# Patient Record
Sex: Female | Born: 1985 | Race: Black or African American | Hispanic: No | Marital: Married | Smoking: Current every day smoker
Health system: Southern US, Community
[De-identification: ages and names within clinical notes are randomized; demographics above are authoritative.]

## PROBLEM LIST (undated history)

## (undated) DIAGNOSIS — I5030 Unspecified diastolic (congestive) heart failure: Secondary | ICD-10-CM

## (undated) DIAGNOSIS — G4733 Obstructive sleep apnea (adult) (pediatric): Secondary | ICD-10-CM

## (undated) DIAGNOSIS — I509 Heart failure, unspecified: Secondary | ICD-10-CM

## (undated) DIAGNOSIS — J449 Chronic obstructive pulmonary disease, unspecified: Principal | ICD-10-CM

## (undated) DIAGNOSIS — F17200 Nicotine dependence, unspecified, uncomplicated: Secondary | ICD-10-CM

## (undated) DIAGNOSIS — J9611 Chronic respiratory failure with hypoxia: Secondary | ICD-10-CM

## (undated) HISTORY — PX: TUBAL LIGATION: SHX77

## (undated) HISTORY — PX: IUD REMOVAL: SHX5392

## (undated) HISTORY — PX: INTRAUTERINE DEVICE INSERTION: SHX323

## (undated) HISTORY — PX: CHOLECYSTECTOMY: SHX55

---
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
## 2003-04-01 ENCOUNTER — Inpatient Hospital Stay (HOSPITAL_COMMUNITY): Admission: AD | Admit: 2003-04-01 | Discharge: 2003-04-01 | Payer: Self-pay | Admitting: Obstetrics & Gynecology

## 2011-02-03 ENCOUNTER — Emergency Department (INDEPENDENT_AMBULATORY_CARE_PROVIDER_SITE_OTHER): Payer: Self-pay

## 2011-02-03 ENCOUNTER — Emergency Department (HOSPITAL_BASED_OUTPATIENT_CLINIC_OR_DEPARTMENT_OTHER)
Admission: EM | Admit: 2011-02-03 | Discharge: 2011-02-03 | Disposition: A | Discharge status: Home / Self Care | Payer: Self-pay | Attending: Emergency Medicine | Admitting: Emergency Medicine

## 2011-02-03 ENCOUNTER — Encounter: Payer: Self-pay | Admitting: *Deleted

## 2011-02-03 DIAGNOSIS — J189 Pneumonia, unspecified organism: Secondary | ICD-10-CM | POA: Insufficient documentation

## 2011-02-03 DIAGNOSIS — R079 Chest pain, unspecified: Secondary | ICD-10-CM

## 2011-02-03 DIAGNOSIS — R0789 Other chest pain: Secondary | ICD-10-CM

## 2011-02-03 DIAGNOSIS — R0602 Shortness of breath: Secondary | ICD-10-CM

## 2011-02-03 DIAGNOSIS — R071 Chest pain on breathing: Secondary | ICD-10-CM | POA: Insufficient documentation

## 2011-02-03 DIAGNOSIS — J45909 Unspecified asthma, uncomplicated: Secondary | ICD-10-CM | POA: Insufficient documentation

## 2011-02-03 DIAGNOSIS — J45901 Unspecified asthma with (acute) exacerbation: Secondary | ICD-10-CM

## 2011-02-03 MED ORDER — ALBUTEROL SULFATE HFA 108 (90 BASE) MCG/ACT IN AERS
4.0000 | INHALATION_SPRAY | Freq: Once | RESPIRATORY_TRACT | Status: AC
  Administered 2011-02-03: 4 via RESPIRATORY_TRACT
  Filled 2011-02-03: qty 6.7

## 2011-02-03 MED ORDER — OXYCODONE-ACETAMINOPHEN 5-325 MG PO TABS: 2.0000 | ORAL_TABLET | ORAL | Status: AC | PRN

## 2011-02-03 MED ORDER — AMOXICILLIN 500 MG PO TABS: 1000.0000 mg | ORAL_TABLET | Freq: Two times a day (BID) | ORAL | Status: AC

## 2011-02-03 MED ORDER — PREDNISONE 20 MG PO TABS: ORAL_TABLET | ORAL | Status: AC

## 2011-02-03 MED ORDER — ALBUTEROL SULFATE (5 MG/ML) 0.5% IN NEBU
5.0000 mg | INHALATION_SOLUTION | Freq: Once | RESPIRATORY_TRACT | Status: AC
  Administered 2011-02-03: 5 mg via RESPIRATORY_TRACT

## 2011-02-03 MED ORDER — ALBUTEROL SULFATE (5 MG/ML) 0.5% IN NEBU
INHALATION_SOLUTION | RESPIRATORY_TRACT | Status: AC
  Filled 2011-02-03: qty 1

## 2011-02-03 MED ORDER — IPRATROPIUM BROMIDE 0.02 % IN SOLN
RESPIRATORY_TRACT | Status: AC
  Filled 2011-02-03: qty 2.5

## 2011-02-03 MED ORDER — PREDNISONE 50 MG PO TABS
60.0000 mg | ORAL_TABLET | Freq: Once | ORAL | Status: AC
  Administered 2011-02-03: 60 mg via ORAL
  Filled 2011-02-03: qty 1

## 2011-02-03 MED ORDER — OXYCODONE-ACETAMINOPHEN 5-325 MG PO TABS
2.0000 | ORAL_TABLET | Freq: Once | ORAL | Status: DC
  Filled 2011-02-03: qty 2

## 2011-02-03 MED ORDER — IPRATROPIUM BROMIDE 0.02 % IN SOLN
0.5000 mg | Freq: Once | RESPIRATORY_TRACT | Status: AC
  Administered 2011-02-03: 0.5 mg via RESPIRATORY_TRACT

## 2011-02-03 NOTE — ED Notes (Signed)
Pt states that she has an hx of asthma but hasnt used anything for it in a long time and nothing presently. Pt presented to the ED with some pain in chest and stated that it hurt for her to cough and take deep breaths.

## 2011-02-03 NOTE — ED Notes (Signed)
Pt states she was seen at [facility] a week ago and dx'd with pneumonitis. Given antibiotic. About 2 days ago debeloped increased SHOB, hurts under left breast into back. Also hurts to cough, move or take a deep breath.

## 2011-02-03 NOTE — ED Provider Notes (Signed)
History  Scribed for Hurman Horn, MD, the patient was seen in room MH02. This chart was scribed by Hillery Hunter.   CSN: [id number] Arrival date & time: 02/03/2011  8:33 PM   First MD Initiated Contact with Patient 02/03/11 2131      Chief Complaint  Patient presents with  . Chest Pain    The history is provided by the patient.   Renee Paul is a 25 y.o. female who presents to the Emergency Department complaining of left lateral chest wall pain with over one week of coughing, wheezing and shortness of breath. She has a history of asthma and confirms that she smokes cigarettes. She reports that she had a CXR taken and dx with pneumonitis about one week ago and was given analgesic, antibiotic, prednisone and albuterol inhaler. She was able to fill the scripts and finish the dosages of the hydrocodone, antibiotic and prednisone, but was not able to fill the Albuterol inhaler. She returns today due to persistent left chest wall pain that is worse with coughing and deep breathing and movements. She denies any fever, rash, hallucinations.    Past Medical History  Diagnosis Date  . Asthma     Past Surgical History  Procedure Date  . Cesarean section     History reviewed. No pertinent family history.  History  Substance Use Topics  . Smoking status: Current Everyday Smoker  . Smokeless tobacco: Not on file  . Alcohol Use: No     Review of Systems  Constitutional: Negative for fever.  HENT: Negative for neck pain.   Eyes: Negative for discharge.  Respiratory: Positive for cough and wheezing.   Cardiovascular: Positive for chest pain (chest wall soreness and tenderness on left side).  Gastrointestinal: Negative for nausea and vomiting.  Genitourinary: Negative for dysuria.  Musculoskeletal: Negative for gait problem.  Skin: Negative for rash.  Neurological: Negative for light-headedness.  Psychiatric/Behavioral: Negative for hallucinations and confusion.     Allergies  Review of patient's allergies indicates no known allergies.  Home Medications   Current Outpatient Rx  Name Route Sig Dispense Refill  . LEVONORGESTREL 20 MCG/24HR IU IUD Intrauterine 1 each by Intrauterine route once.        Pulse 95  Ht 5\' 3"  (1.6 m)  Wt 310 lb (140.615 kg)  BMI 54.91 kg/m2  SpO2 96%  LMP 01/31/2011  Physical Exam  Nursing note and vitals reviewed. Constitutional:       Awake, alert, nontoxic appearance, morbidly obese  HENT:  Head: Atraumatic.  Mouth/Throat: Oropharynx is clear and moist.  Eyes: Right eye exhibits no discharge. Left eye exhibits no discharge.  Neck: Neck supple.  Cardiovascular: Normal rate, regular rhythm and normal heart sounds.   No murmur heard. Pulmonary/Chest: Effort normal. No respiratory distress. She has wheezes (expiratory wheezes throughout). She has no rales. She exhibits tenderness (whole left lower rib cage).  Abdominal: Soft. There is no tenderness. There is no rebound.  Musculoskeletal: She exhibits no tenderness.       Baseline ROM, no obvious new focal weakness.  Neurological:       Mental status and motor strength appears baseline for patient and situation.  Skin: No rash noted.  Psychiatric: She has a normal mood and affect.    ED Course  Procedures    Dg Chest 2 View  02/03/2011  *RADIOLOGY REPORT*  Clinical Data: Left-sided chest pain, shortness of breath and cough; history of smoking.  CHEST - 2 VIEW  Comparison: None.  Findings: The lungs are well-aerated.  Mild focal left lower lobe opacity likely reflects pneumonia.  There is no evidence of pleural effusion or pneumothorax.  The heart is normal in size; the mediastinal contour is within normal limits.  No acute osseous abnormalities are seen.  IMPRESSION: Mild focal left lower lobe pneumonia noted.  Original Report Authenticated By: Tonia Ghent, M.D.     OTHER DATA REVIEWED: Nursing notes, vital signs, and past medical records  reviewed.   DIAGNOSTIC STUDIES: Oxygen Saturation is 96% on room air, normal by my interpretation.     ED COURSE / COORDINATION OF CARE: 21:40. Smoking cessation discussion with patient for 3 minutes.  21:47. Ordered: DG Chest 2 View ; albuterol (PROVENTIL HFA;VENTOLIN HFA) 108 (90 BASE) MCG/ACT inhaler 4 puff ; predniSONE (DELTASONE) tablet 60 mg ; oxyCODONE-acetaminophen (PERCOCET) 5-325 MG per tablet 2 tablet  22:36. Discussed test findings and treatment plan with patient at bedside who is amenable to plan and comfortable for discharge.   MDM  Patient / Family / Caregiver informed of clinical course, understand medical decision-making process, and agree with plan.I doubt any other EMC precluding discharge at this time including, but not necessarily limited to the following:sepsis.  Pt Dx recent PNA escort states today's CXR looks better than the one at [facility] a week ago.   1. Community acquired pneumonia   2. Asthma attack   3. Chest wall pain      I personally performed the services described in this documentation, which was scribed in my presence. The recorded information has been reviewed and considered.    Hurman Horn, MD 02/04/11 [phone]

## 2011-08-24 ENCOUNTER — Other Ambulatory Visit: Payer: Self-pay

## 2011-09-11 ENCOUNTER — Emergency Department (HOSPITAL_COMMUNITY)
Admission: EM | Admit: 2011-09-11 | Discharge: 2011-09-11 | Disposition: A | Discharge status: Home / Self Care | Payer: BC Managed Care – PPO | Attending: Emergency Medicine | Admitting: Emergency Medicine

## 2011-09-11 ENCOUNTER — Encounter (HOSPITAL_COMMUNITY): Payer: Self-pay | Admitting: Family Medicine

## 2011-09-11 DIAGNOSIS — O99330 Smoking (tobacco) complicating pregnancy, unspecified trimester: Secondary | ICD-10-CM | POA: Insufficient documentation

## 2011-09-11 DIAGNOSIS — O99210 Obesity complicating pregnancy, unspecified trimester: Secondary | ICD-10-CM | POA: Insufficient documentation

## 2011-09-11 DIAGNOSIS — E669 Obesity, unspecified: Secondary | ICD-10-CM | POA: Insufficient documentation

## 2011-09-11 DIAGNOSIS — O200 Threatened abortion: Secondary | ICD-10-CM | POA: Insufficient documentation

## 2011-09-11 MED ORDER — ONDANSETRON 8 MG PO TBDP
8.0000 mg | ORAL_TABLET | Freq: Once | ORAL | Status: AC
  Administered 2011-09-11: 8 mg via ORAL
  Filled 2011-09-11: qty 1

## 2011-09-11 MED ORDER — ACETAMINOPHEN 325 MG PO TABS
650.0000 mg | ORAL_TABLET | Freq: Once | ORAL | Status: AC
  Administered 2011-09-11: 650 mg via ORAL
  Filled 2011-09-11: qty 2

## 2011-09-11 NOTE — ED Notes (Signed)
Unable to auscultate fetal heart tone at this time. Patient states that fetal heart tones were heard at [facility] with rate in the 150s.

## 2011-09-11 NOTE — ED Provider Notes (Addendum)
History     CSN: [id number]  Arrival date & time 09/11/11  1844   First MD Initiated Contact with Patient 09/11/11 2004      Chief Complaint  Patient presents with  . Vaginal Bleeding    (Consider location/radiation/quality/duration/timing/severity/associated sxs/prior treatment) HPI Comments: Seen today at [facility] and was diagnosed with a threatened abortion. She states her care was poor and she didn't bleed but they said he came here for second opinion.  Patient is a 26 y.o. female presenting with vaginal bleeding. The history is provided by the patient.  Vaginal Bleeding This is a new problem. The current episode started 6 to 12 hours ago. The problem occurs constantly. The problem has been gradually improving. Associated symptoms include abdominal pain. Pertinent negatives include no chest pain and no shortness of breath. The symptoms are aggravated by intercourse (started after intercourse today). Nothing relieves the symptoms. She has tried nothing for the symptoms. The treatment provided no relief.    Past Medical History  Diagnosis Date  . Asthma     Past Surgical History  Procedure Date  . Cesarean section   . Cesarean section   . Intrauterine device insertion   . Iud removal     No family history on file.  History  Substance Use Topics  . Smoking status: Current Everyday Smoker -- 0.2 packs/day    Types: Cigarettes  . Smokeless tobacco: Not on file  . Alcohol Use: No    OB History    Grav Para Term Preterm Abortions TAB SAB Ect Mult Living   1               Review of Systems  Respiratory: Negative for shortness of breath.   Cardiovascular: Negative for chest pain.  Gastrointestinal: Positive for abdominal pain.  Genitourinary: Positive for vaginal bleeding. Negative for vaginal discharge and pelvic pain.  All other systems reviewed and are negative.    Allergies  Review of patient's allergies indicates no known allergies.  Home  Medications   Current Outpatient Rx  Name Route Sig Dispense Refill  . PRENATAL MULTIVITAMIN CH Oral Take 1 tablet by mouth daily.      BP 140/85  Pulse 83  Temp 98.8 F (37.1 C) (Oral)  Resp 18  SpO2 98%  LMP 01/31/2011  Physical Exam  Nursing note and vitals reviewed. Constitutional: She is oriented to person, place, and time. She appears well-developed and well-nourished. No distress.       Morbidly obese  HENT:  Head: Normocephalic and atraumatic.  Mouth/Throat: Oropharynx is clear and moist.  Eyes: Conjunctivae and EOM are normal. Pupils are equal, round, and reactive to light.  Neck: Normal range of motion. Neck supple.  Cardiovascular: Normal rate, regular rhythm and intact distal pulses.   No murmur heard. Pulmonary/Chest: Effort normal and breath sounds normal. No respiratory distress. She has no wheezes. She has no rales.  Abdominal: Soft. She exhibits no distension. There is tenderness in the suprapubic area. There is no rebound, no guarding and no CVA tenderness.  Genitourinary: Uterus is enlarged. Cervix exhibits no motion tenderness and no discharge. Right adnexum displays no tenderness. Left adnexum displays no tenderness. There is bleeding around the vagina.  Musculoskeletal: Normal range of motion. She exhibits no edema and no tenderness.  Neurological: She is alert and oriented to person, place, and time.  Skin: Skin is warm and dry. No rash noted. No erythema.  Psychiatric: She has a normal mood and affect. Her  behavior is normal.    ED Course  Procedures (including critical care time)  Labs Reviewed - No data to display No results found.  EMERGENCY DEPARTMENT [place] PREGNANCY "Study: Limited Ultrasound of the Pelvis"  INDICATIONS:Pregnancy(required) and Vaginal bleeding Multiple views of the uterus and pelvic cavity are obtained with a multi-frequency probe.  APPROACH:Transabdominal   PERFORMED BY: Myself  IMAGES ARCHIVED?: No  LIMITATIONS: Body  habitus  PREGNANCY FREE FLUID: None  PREGNANCY UTERUS FINDINGS:Uterus enlarged ADNEXAL FINDINGS:Left ovary not seen and Right ovary not seen  PREGNANCY FINDINGS: Intrauterine gestational sac noted and Fetal heart activity seen  INTERPRETATION: Viable intrauterine pregnancy and Pelvic free fluid absent  GESTATIONAL AGE, ESTIMATE: 11 weeks  FETAL HEART RATE: 143  COMMENT(Estimate of Gestational Age):       1. Threatened abortion       MDM   Patient with vaginal bleeding that started today. She was initially seen [facility] and told that she had a hemorrhage in her uterus at that point the fetus was normal and was discharged. Patient states she was treated variably and had very bad care there and came here because she wanted to make sure everything was okay. On exam patient has some mild lower abdominal cramping but otherwise normal exam. Ultrasound at bedside showed an active fetus measuring [redacted] weeks gestation with positive fetal heart tones of 145. Patient does have blood in her vaginal vault without clotting and no cervical motion tenderness. Patient has no history of Rh incompatibility and has never received RhoGAM for her other pregnancies.  Patient denies any urinary symptoms and states the pain started after having sex this morning. Discussed with her taking Tylenol as needed for the cramping and she has follow up with her OB/GYN tomorrow.        Gwyneth Sprout, MD 09/11/11 2200  Gwyneth Sprout, MD 09/11/11 2205

## 2011-09-11 NOTE — ED Notes (Signed)
Patient states that she is [redacted] weeks pregnant, 3rd pregnancy. OB is in [employer]. States that she started having vaginal bleeding this morning around 11am while sitting in class. Patient was taken to [facility] and was told she has a hemorrhage in her uterus. Patient states staff was rude and she was discharged from [facility] with instructions to follow-up with OB. (Dr. Ferdinand Cava). States she is still having vaginal pain and pain in her abdomen.

## 2011-09-11 NOTE — ED Notes (Signed)
Bed:WA13<BR> Expected date:<BR> Expected time:<BR> Means of arrival:<BR> Comments:<BR> Hold for triage 2

## 2011-11-07 ENCOUNTER — Other Ambulatory Visit (HOSPITAL_COMMUNITY): Payer: Self-pay | Admitting: Obstetrics and Gynecology

## 2011-11-07 DIAGNOSIS — Z3689 Encounter for other specified antenatal screening: Secondary | ICD-10-CM

## 2011-11-13 ENCOUNTER — Ambulatory Visit (HOSPITAL_COMMUNITY)
Admission: RE | Admit: 2011-11-13 | Discharge: 2011-11-13 | Disposition: A | Discharge status: Home / Self Care | Payer: BC Managed Care – PPO | Source: Ambulatory Visit | Attending: Obstetrics and Gynecology | Admitting: Obstetrics and Gynecology

## 2011-11-13 ENCOUNTER — Encounter (HOSPITAL_COMMUNITY): Payer: Self-pay

## 2011-11-13 VITALS — BP 120/84 | HR 82 | Wt 392.0 lb

## 2011-11-13 DIAGNOSIS — O34219 Maternal care for unspecified type scar from previous cesarean delivery: Secondary | ICD-10-CM | POA: Insufficient documentation

## 2011-11-13 DIAGNOSIS — O99210 Obesity complicating pregnancy, unspecified trimester: Secondary | ICD-10-CM | POA: Insufficient documentation

## 2011-11-13 DIAGNOSIS — O358XX0 Maternal care for other (suspected) fetal abnormality and damage, not applicable or unspecified: Secondary | ICD-10-CM | POA: Insufficient documentation

## 2011-11-13 DIAGNOSIS — E669 Obesity, unspecified: Secondary | ICD-10-CM | POA: Insufficient documentation

## 2011-11-13 DIAGNOSIS — Z3689 Encounter for other specified antenatal screening: Secondary | ICD-10-CM

## 2011-11-13 NOTE — Progress Notes (Signed)
Renee Paul  was seen today for an ultrasound appointment.  See full report in [facility].  Alpha Gula, MD  Single IUP at 20 2/7 weeks No fetal anomalies noted; limited views of the fetal heart and spine were obtained due to poor ultrasound penetration No markers associated with aneuploidy were noted Normal amniotic fluid volume.  Recommend follow up ultrasound in 4 weeks to reevaluate the heart and spine.

## 2011-12-13 ENCOUNTER — Ambulatory Visit (HOSPITAL_COMMUNITY)
Admission: RE | Admit: 2011-12-13 | Discharge: 2011-12-13 | Disposition: A | Discharge status: Home / Self Care | Payer: BC Managed Care – PPO | Source: Ambulatory Visit | Attending: Obstetrics and Gynecology | Admitting: Obstetrics and Gynecology

## 2011-12-13 DIAGNOSIS — E669 Obesity, unspecified: Secondary | ICD-10-CM | POA: Insufficient documentation

## 2011-12-13 DIAGNOSIS — O34219 Maternal care for unspecified type scar from previous cesarean delivery: Secondary | ICD-10-CM | POA: Insufficient documentation

## 2011-12-13 DIAGNOSIS — Z3689 Encounter for other specified antenatal screening: Secondary | ICD-10-CM

## 2011-12-13 DIAGNOSIS — O99210 Obesity complicating pregnancy, unspecified trimester: Secondary | ICD-10-CM | POA: Insufficient documentation

## 2011-12-13 NOTE — Progress Notes (Signed)
Ms. Vanscyoc had an ultrasound appointment today.  Please see [facility] report for details.  Comments There is an active singleton fetus with no apparent dysmorphic features on today's routine anatomic re-examination.  The biometry suggests a fetus with an EFW at the approximately 38th percentile for gestational age.    Impression Active singleton fetus. Normal interval growth. Normal amniotic fluid volume Imaging of the fetal heart remains suboptimal noting that the RVOT and Ductal Arch are not well seen today, warranting follow up evaluation.   Recommendations 1. Repeat interval growth assessment and follow up anatomy (RVOT and Ductal Arch) by ultrasound was scheduled for your patient in 4 weeks. 2. Follow as clinically indicated.  Rogelia Boga, MD, MS, FACOG Assistant Professor Section of Maternal-Fetal Medicine [facility]

## 2011-12-19 ENCOUNTER — Institutional Professional Consult (permissible substitution): Payer: BC Managed Care – PPO | Admitting: Critical Care Medicine

## 2012-01-09 ENCOUNTER — Other Ambulatory Visit (HOSPITAL_COMMUNITY): Payer: Self-pay | Admitting: Obstetrics and Gynecology

## 2012-01-09 DIAGNOSIS — Z0489 Encounter for examination and observation for other specified reasons: Secondary | ICD-10-CM

## 2012-01-10 ENCOUNTER — Ambulatory Visit (HOSPITAL_COMMUNITY): Payer: BC Managed Care – PPO

## 2012-01-17 ENCOUNTER — Ambulatory Visit (HOSPITAL_COMMUNITY)
Admission: RE | Admit: 2012-01-17 | Discharge: 2012-01-17 | Disposition: A | Discharge status: Home / Self Care | Payer: BC Managed Care – PPO | Source: Ambulatory Visit | Attending: Obstetrics and Gynecology | Admitting: Obstetrics and Gynecology

## 2012-01-17 DIAGNOSIS — Z1389 Encounter for screening for other disorder: Secondary | ICD-10-CM | POA: Insufficient documentation

## 2012-01-17 DIAGNOSIS — Z363 Encounter for antenatal screening for malformations: Secondary | ICD-10-CM | POA: Insufficient documentation

## 2012-01-17 DIAGNOSIS — Z0489 Encounter for examination and observation for other specified reasons: Secondary | ICD-10-CM

## 2012-01-17 NOTE — Progress Notes (Signed)
Renee Paul was seen for ultrasound appointment today.  Please see [facility]-OBGYN report for details.

## 2012-02-14 ENCOUNTER — Ambulatory Visit (HOSPITAL_COMMUNITY): Payer: BC Managed Care – PPO

## 2012-02-17 ENCOUNTER — Other Ambulatory Visit (HOSPITAL_COMMUNITY): Payer: Self-pay | Admitting: Obstetrics and Gynecology

## 2012-02-17 ENCOUNTER — Ambulatory Visit (HOSPITAL_COMMUNITY)
Admission: RE | Admit: 2012-02-17 | Discharge: 2012-02-17 | Disposition: A | Discharge status: Home / Self Care | Payer: BC Managed Care – PPO | Source: Ambulatory Visit | Attending: Obstetrics and Gynecology | Admitting: Obstetrics and Gynecology

## 2012-02-17 ENCOUNTER — Other Ambulatory Visit: Payer: Self-pay

## 2012-02-17 ENCOUNTER — Ambulatory Visit (HOSPITAL_COMMUNITY)
Admission: RE | Admit: 2012-02-17 | Discharge: 2012-02-17 | Payer: BC Managed Care – PPO | Source: Ambulatory Visit | Attending: Obstetrics and Gynecology | Admitting: Obstetrics and Gynecology

## 2012-02-17 DIAGNOSIS — Z1389 Encounter for screening for other disorder: Secondary | ICD-10-CM | POA: Insufficient documentation

## 2012-02-17 DIAGNOSIS — Z363 Encounter for antenatal screening for malformations: Secondary | ICD-10-CM | POA: Insufficient documentation

## 2012-02-17 DIAGNOSIS — Z0489 Encounter for examination and observation for other specified reasons: Secondary | ICD-10-CM

## 2012-02-17 NOTE — Progress Notes (Signed)
[name]  had an ultrasound appointment today.  Please see [facility]OB/GY[facility] report for details.  Comments There is an active singleton fetus with no apparent structural defects on today's routine anatomic re-examination.  The biometry suggests a fetus with an EFW at the approximately 27th percentile for gestational age with noted short humerus bones (bilateral, <5th) and femurs (<2.3 percentile). A detailed survey of long bones (radius, ulna, tibia, fibula) demonstrate measurements within normal range.  I discussed the implications of today's findings with your patient in context of "low risk" maternal serum quad screen, explaining to her that today's measurements are most likely representative of normal variation, especially in context of relatively short maternal and paternal stature.  However, short humerus does have an association with Trisomy21, warranting my discussion with her.  She elected to have Harmony test.  Results will be reviewed with your patient when they become available.  Amniocentesis was offered as an option for formal diagnosis of fetal karyotype, but your patient did not express any interest and declined.   Impression Active singleton fetus with short humerus and femur measurements but overall normal interval growth. Normal amniotic fluid volume   Recommendations 1. Repeat interval growth assessment by ultrasound was scheduled for your patient in 3 weeks. 2. Harmony test was drawn for your patient.   3. Follow as clinically indicated.  Rogelia Boga, MD, MS, FACOG Assistant Professor Section of Maternal-Fetal Medicine [facility]

## 2012-02-17 NOTE — Progress Notes (Signed)
[name]  had an ultrasound appointment today.  Please see [facility]-OB/GYN report for details.  Comments There is an active singleton fetus with no apparent structural defects on today's routine anatomic re-examination.  The biometry suggests a fetus with an EFW at the approximately 27th percentile for gestational age with noted short humerus bones (bilateral, <5th) and femurs (<2.3 percentile). A detailed survey of long bones (radius, ulna, tibia, fibula) demonstrate measurements within normal range.  I discussed the implications of today's findings with your patient in context of "low risk" maternal serum quad screen, explaining to her that today's measurements are most likely representative of normal variation, especially in context of relatively short maternal and paternal stature.  However, short humerus does have an association with Trisomy21, warranting my discussion with her.  She elected to have Harmony test.  Results will be reviewed with your patient when they become available.  Amniocentesis was offered as an option for formal diagnosis of fetal karyotype, but your patient did not express any interest and declined.   Impression Active singleton fetus with short humerus and femur measurements but overall normal interval growth. Normal amniotic fluid volume   Recommendations 1. Repeat interval growth assessment by ultrasound was scheduled for your patient in 3 weeks. 2. Harmony test was drawn for your patient.   3. Follow as clinically indicated.  Denney, Jeffrey Morgan, MD, MS, FACOG Assistant Professor Section of Maternal-Fetal Medicine [employer]  

## 2012-02-28 ENCOUNTER — Telehealth (HOSPITAL_COMMUNITY): Payer: Self-pay | Admitting: MS"

## 2012-02-28 NOTE — Telephone Encounter (Signed)
Left message for patient to return call.   Renee Paul 02/28/2012  10:21 AM

## 2012-02-28 NOTE — Telephone Encounter (Signed)
Called Renee Paul to discuss her Harmony, cell free fetal DNA testing. Testing was offered because of previous ultrasound findings. We reviewed that these are within normal limits, showing a less than 1 in 10,000 risk for trisomies 21, 18 and 13. We reviewed that this testing identifies > 99% of pregnancies with trisomy 21, >98% of pregnancies with trisomy 76, and >80% with trisomy 56; the false positive rate is <0.1% for all conditions. She understands that this testing does not identify all genetic conditions. All questions were answered to her satisfaction, she was encouraged to call with additional questions or concerns.  Quinn Plowman, MS Patent attorney

## 2012-03-13 ENCOUNTER — Ambulatory Visit (HOSPITAL_COMMUNITY)
Admission: RE | Admit: 2012-03-13 | Discharge: 2012-03-13 | Disposition: A | Discharge status: Home / Self Care | Payer: BC Managed Care – PPO | Source: Ambulatory Visit | Attending: Obstetrics and Gynecology | Admitting: Obstetrics and Gynecology

## 2012-03-13 DIAGNOSIS — E669 Obesity, unspecified: Secondary | ICD-10-CM | POA: Insufficient documentation

## 2012-03-13 DIAGNOSIS — O358XX0 Maternal care for other (suspected) fetal abnormality and damage, not applicable or unspecified: Secondary | ICD-10-CM | POA: Insufficient documentation

## 2012-03-13 DIAGNOSIS — Z0489 Encounter for examination and observation for other specified reasons: Secondary | ICD-10-CM

## 2012-03-13 DIAGNOSIS — O34219 Maternal care for unspecified type scar from previous cesarean delivery: Secondary | ICD-10-CM | POA: Insufficient documentation

## 2012-06-23 ENCOUNTER — Emergency Department (HOSPITAL_BASED_OUTPATIENT_CLINIC_OR_DEPARTMENT_OTHER): Payer: BC Managed Care – PPO

## 2012-06-23 ENCOUNTER — Encounter (HOSPITAL_BASED_OUTPATIENT_CLINIC_OR_DEPARTMENT_OTHER): Payer: Self-pay | Admitting: *Deleted

## 2012-06-23 ENCOUNTER — Emergency Department (HOSPITAL_BASED_OUTPATIENT_CLINIC_OR_DEPARTMENT_OTHER)
Admission: EM | Admit: 2012-06-23 | Discharge: 2012-06-23 | Disposition: A | Discharge status: Home / Self Care | Payer: BC Managed Care – PPO | Attending: Emergency Medicine | Admitting: Emergency Medicine

## 2012-06-23 DIAGNOSIS — R109 Unspecified abdominal pain: Secondary | ICD-10-CM

## 2012-06-23 DIAGNOSIS — F17200 Nicotine dependence, unspecified, uncomplicated: Secondary | ICD-10-CM | POA: Insufficient documentation

## 2012-06-23 DIAGNOSIS — K5900 Constipation, unspecified: Secondary | ICD-10-CM | POA: Insufficient documentation

## 2012-06-23 DIAGNOSIS — Z79899 Other long term (current) drug therapy: Secondary | ICD-10-CM | POA: Insufficient documentation

## 2012-06-23 DIAGNOSIS — R1084 Generalized abdominal pain: Secondary | ICD-10-CM | POA: Insufficient documentation

## 2012-06-23 DIAGNOSIS — J45909 Unspecified asthma, uncomplicated: Secondary | ICD-10-CM | POA: Insufficient documentation

## 2012-06-23 MED ORDER — IOHEXOL 300 MG/ML  SOLN
50.0000 mL | Freq: Once | INTRAMUSCULAR | Status: AC | PRN
  Administered 2012-06-23: 50 mL via ORAL

## 2012-06-23 MED ORDER — IOHEXOL 300 MG/ML  SOLN: 100.0000 mL | Freq: Once | INTRAMUSCULAR | Status: DC | PRN

## 2012-06-23 NOTE — ED Notes (Signed)
Abdominal pain. Constipated x 3 days.

## 2012-06-23 NOTE — ED Notes (Signed)
MD at bedside. 

## 2012-06-23 NOTE — ED Provider Notes (Signed)
History     CSN: [id number]  Arrival date & time 06/23/12  4098   First MD Initiated Contact with Patient 06/23/12 1913      Chief Complaint  Patient presents with  . Abdominal Pain    (Consider location/radiation/quality/duration/timing/severity/associated sxs/prior treatment) HPI Comments: Patient presents with 3 day history of abdominal cramping, unable to have a bowel movement.  She is passing gas and feels to need to defecate, but can't.  Happened once before after surgery while taking pain medications but she is not on anything now.  It was brought to my attention after I spoke with the patient that she was just at [facility] today for the same complaint.  They told her her liver was enlarged and wanted to do a ct scan, however she "got scared and left".    Patient is a 27 y.o. female presenting with abdominal pain. The history is provided by the patient.  Abdominal Pain Pain location:  Generalized Pain quality: cramping   Pain radiates to:  Does not radiate Pain severity:  Moderate Onset quality:  Gradual Duration:  3 days Timing:  Constant Progression:  Worsening Chronicity:  New Context: not diet changes and not suspicious food intake   Relieved by:  Nothing Worsened by:  Nothing tried   Past Medical History  Diagnosis Date  . Asthma     Past Surgical History  Procedure Laterality Date  . Cesarean section    . Cesarean section    . Intrauterine device insertion    . Iud removal      No family history on file.  History  Substance Use Topics  . Smoking status: Current Every Day Smoker -- 0.25 packs/day    Types: Cigarettes  . Smokeless tobacco: Not on file  . Alcohol Use: Yes    OB History   Grav Para Term Preterm Abortions TAB SAB Ect Mult Living   3 2 2  0 0 0 0 0 0 2      Review of Systems  Gastrointestinal: Positive for abdominal pain.  All other systems reviewed and are negative.    Allergies  Review of patient's allergies indicates no known  allergies.  Home Medications   Current Outpatient Rx  Name  Route  Sig  Dispense  Refill  . ondansetron (ZOFRAN) 8 MG tablet               . Prenatal Vit-Fe Fumarate-FA (PRENATAL MULTIVITAMIN) TABS   Oral   Take 1 tablet by mouth daily.         . ranitidine (ZANTAC) 300 MG tablet   Oral   Take 300 mg by mouth.           BP 129/79  Pulse 87  Temp(Src) 98.5 F (36.9 C) (Oral)  Resp 20  Wt 367 lb (166.47 kg)  BMI 65.03 kg/m2  SpO2 97%  LMP 05/23/2012  Breastfeeding? Unknown  Physical Exam  Nursing note and vitals reviewed. Constitutional: She is oriented to person, place, and time. She appears well-developed and well-nourished. No distress.  HENT:  Head: Normocephalic and atraumatic.  Neck: Normal range of motion. Neck supple.  Cardiovascular: Normal rate and regular rhythm.  Exam reveals no gallop and no friction rub.   No murmur heard. Pulmonary/Chest: Effort normal and breath sounds normal. No respiratory distress. She has no wheezes.  Abdominal: Soft. Bowel sounds are normal. She exhibits no distension. There is no tenderness.  The abdomen is obese.  There is mild ttp in the  lower abdomen without rebound or guarding.    Musculoskeletal: Normal range of motion.  Neurological: She is alert and oriented to person, place, and time.  Skin: Skin is warm and dry. She is not diaphoretic.    ED Course  Procedures (including critical care time)  Labs Reviewed - No data to display No results found.   No diagnosis found.    MDM  The patient presents with abd pain and constipation for three days.  She left [facility] ED because she "got scared", then came here.  The abd exam is benign and she is afebrile.  As [facility] had planned a ct scan, I will perform this here.   This was performed without iv contrast as access was unable to be obtained.  The was negative for acute process but was somewhat a limited study.  Contrast entered the colon, so likely no obstruction.  There was  also no sign of a fecal impaction.  I doubt there is any emergent pathology.  She was re-examined and the abdomen remained benign.  I will have her try mag citrate as she has not had a BM in 3 days and see what happens.  She was given instructions for return and follow up.          Geoffery Lyons, MD 06/24/12 [phone]

## 2012-06-23 NOTE — ED Notes (Signed)
IV attempted numerous times by several different staff members without success. Dr. Judd Lien notified who gave VO to modify CT to w/o contrast and discontinue further attempts for IV access at this time.

## 2012-09-21 ENCOUNTER — Emergency Department (HOSPITAL_BASED_OUTPATIENT_CLINIC_OR_DEPARTMENT_OTHER)
Admission: EM | Admit: 2012-09-21 | Discharge: 2012-09-21 | Disposition: A | Discharge status: Home / Self Care | Payer: BC Managed Care – PPO | Attending: Emergency Medicine | Admitting: Emergency Medicine

## 2012-09-21 ENCOUNTER — Emergency Department (HOSPITAL_BASED_OUTPATIENT_CLINIC_OR_DEPARTMENT_OTHER): Payer: BC Managed Care – PPO

## 2012-09-21 ENCOUNTER — Encounter (HOSPITAL_BASED_OUTPATIENT_CLINIC_OR_DEPARTMENT_OTHER): Payer: Self-pay | Admitting: *Deleted

## 2012-09-21 DIAGNOSIS — J45901 Unspecified asthma with (acute) exacerbation: Secondary | ICD-10-CM | POA: Insufficient documentation

## 2012-09-21 DIAGNOSIS — F17200 Nicotine dependence, unspecified, uncomplicated: Secondary | ICD-10-CM | POA: Insufficient documentation

## 2012-09-21 DIAGNOSIS — J4531 Mild persistent asthma with (acute) exacerbation: Secondary | ICD-10-CM

## 2012-09-21 DIAGNOSIS — R059 Cough, unspecified: Secondary | ICD-10-CM | POA: Insufficient documentation

## 2012-09-21 DIAGNOSIS — R0789 Other chest pain: Secondary | ICD-10-CM | POA: Insufficient documentation

## 2012-09-21 DIAGNOSIS — J069 Acute upper respiratory infection, unspecified: Secondary | ICD-10-CM | POA: Insufficient documentation

## 2012-09-21 DIAGNOSIS — Z79899 Other long term (current) drug therapy: Secondary | ICD-10-CM | POA: Insufficient documentation

## 2012-09-21 DIAGNOSIS — R05 Cough: Secondary | ICD-10-CM | POA: Insufficient documentation

## 2012-09-21 DIAGNOSIS — R6883 Chills (without fever): Secondary | ICD-10-CM | POA: Insufficient documentation

## 2012-09-21 DIAGNOSIS — J3489 Other specified disorders of nose and nasal sinuses: Secondary | ICD-10-CM | POA: Insufficient documentation

## 2012-09-21 MED ORDER — ALBUTEROL SULFATE (5 MG/ML) 0.5% IN NEBU
5.0000 mg | INHALATION_SOLUTION | Freq: Once | RESPIRATORY_TRACT | Status: AC
  Administered 2012-09-21: 5 mg via RESPIRATORY_TRACT
  Filled 2012-09-21: qty 0.5

## 2012-09-21 MED ORDER — IPRATROPIUM BROMIDE 0.02 % IN SOLN
0.5000 mg | Freq: Once | RESPIRATORY_TRACT | Status: AC
  Administered 2012-09-21: 0.5 mg via RESPIRATORY_TRACT
  Filled 2012-09-21: qty 5

## 2012-09-21 MED ORDER — SALINE NASAL SPRAY 0.65 % NA SOLN: 1.0000 | NASAL | Status: DC | PRN

## 2012-09-21 MED ORDER — ALBUTEROL SULFATE (5 MG/ML) 0.5% IN NEBU
5.0000 mg | INHALATION_SOLUTION | Freq: Once | RESPIRATORY_TRACT | Status: AC
  Administered 2012-09-21: 5 mg via RESPIRATORY_TRACT
  Filled 2012-09-21: qty 1

## 2012-09-21 MED ORDER — IPRATROPIUM BROMIDE 0.02 % IN SOLN
0.5000 mg | Freq: Once | RESPIRATORY_TRACT | Status: AC
  Administered 2012-09-21: 0.5 mg via RESPIRATORY_TRACT
  Filled 2012-09-21: qty 2.5

## 2012-09-21 MED ORDER — ALBUTEROL SULFATE HFA 108 (90 BASE) MCG/ACT IN AERS: 2.0000 | INHALATION_SPRAY | RESPIRATORY_TRACT | Status: DC | PRN

## 2012-09-21 MED ORDER — DEXAMETHASONE SODIUM PHOSPHATE 10 MG/ML IJ SOLN
10.0000 mg | Freq: Once | INTRAMUSCULAR | Status: AC
  Administered 2012-09-21: 10 mg via INTRAMUSCULAR
  Filled 2012-09-21: qty 1

## 2012-09-21 MED ORDER — ALBUTEROL SULFATE HFA 108 (90 BASE) MCG/ACT IN AERS
2.0000 | INHALATION_SPRAY | Freq: Once | RESPIRATORY_TRACT | Status: DC
  Filled 2012-09-21: qty 6.7

## 2012-09-21 NOTE — ED Notes (Signed)
Uri symptoms for the past two days, chills at night, congestion, sob with any exertion

## 2012-09-21 NOTE — ED Provider Notes (Signed)
History    CSN: [id number] Arrival date & time 09/21/12  0913  First MD Initiated Contact with Patient 09/21/12 [phone]     Chief Complaint  Patient presents with  . Shortness of Breath   (Consider location/radiation/quality/duration/timing/severity/associated sxs/prior Treatment) HPI  Patient is a 27 yo F PMHx significant for asthma and cigarette smoking presenting to the ED for two days of productive cough w/ associated nasal congestion, purulent rhinorrhea, mild chest tightness w/o radiation, exertional shortness of breath, and chills. Patient states her symptoms are aggravated with activity and coughing. Denies any alleviating factors, but has not used her inhaler or any cold OTC medications. Patient states her children are also sick at home with URI symptoms. Denies chest pain, fever, sore throat, ear pain. PERC negative.   Past Medical History  Diagnosis Date  . Asthma    Past Surgical History  Procedure Laterality Date  . Cesarean section    . Cesarean section    . Intrauterine device insertion    . Iud removal    . Tubal ligation     No family history on file. History  Substance Use Topics  . Smoking status: Current Every Day Smoker -- 0.25 packs/day    Types: Cigarettes  . Smokeless tobacco: Not on file  . Alcohol Use: Yes   OB History   Grav Para Term Preterm Abortions TAB SAB Ect Mult Living   3 2 2  0 0 0 0 0 0 2     Review of Systems  Constitutional: Positive for chills.  HENT: Negative for ear pain and sore throat.   Respiratory: Positive for cough, chest tightness and shortness of breath.   Cardiovascular: Negative for chest pain and leg swelling.  Gastrointestinal: Negative for nausea, vomiting, abdominal pain and diarrhea.  Neurological: Negative for headaches.  All other systems reviewed and are negative.    Allergies  Review of patient's allergies indicates no known allergies.  Home Medications   Current Outpatient Rx  Name  Route  Sig   Dispense  Refill  . albuterol (PROVENTIL HFA;VENTOLIN HFA) 108 (90 BASE) MCG/ACT inhaler   Inhalation   Inhale 2 puffs into the lungs every 4 (four) hours as needed for wheezing.   6.7 g   1   . ondansetron (ZOFRAN) 8 MG tablet               . Prenatal Vit-Fe Fumarate-FA (PRENATAL MULTIVITAMIN) TABS   Oral   Take 1 tablet by mouth daily.         . ranitidine (ZANTAC) 300 MG tablet   Oral   Take 300 mg by mouth.         . sodium chloride (OCEAN NASAL SPRAY) 0.65 % nasal spray   Nasal   Place 1 spray into the nose as needed for congestion.   30 mL   0    BP 132/95  Pulse 84  Temp(Src) 98 F (36.7 C) (Oral)  Resp 22  SpO2 98% Physical Exam  Constitutional: She is oriented to person, place, and time. She appears well-developed and well-nourished. No distress.  HENT:  Head: Normocephalic and atraumatic.  Nose: Rhinorrhea present.  Mouth/Throat: Oropharynx is clear and moist.  Eyes: Conjunctivae are normal.  Neck: Neck supple.  Cardiovascular: Normal rate, regular rhythm and normal heart sounds.   Pulmonary/Chest: Effort normal. She has wheezes. She exhibits tenderness.  Abdominal: Soft.  Neurological: She is alert and oriented to person, place, and time.  Skin: Skin is warm  and dry. She is not diaphoretic.    ED Course  Procedures (including critical care time)  Medications  albuterol (PROVENTIL) (5 MG/ML) 0.5% nebulizer solution 5 mg (5 mg Nebulization Given 09/21/12 0931)  ipratropium (ATROVENT) nebulizer solution 0.5 mg (0.5 mg Nebulization Given 09/21/12 0931)  dexamethasone (DECADRON) injection 10 mg (10 mg Intramuscular Given 09/21/12 1020)  albuterol (PROVENTIL) (5 MG/ML) 0.5% nebulizer solution 5 mg (5 mg Nebulization Given 09/21/12 1008)  ipratropium (ATROVENT) nebulizer solution 0.5 mg (0.5 mg Nebulization Given 09/21/12 1007)     Labs Reviewed - No data to display Dg Chest 2 View  09/21/2012   *RADIOLOGY REPORT*  Clinical Data: URI symptoms for past  2 days, shortness of breath, shortness of breath with exertion, chills at night, congestion, history asthma, smoking  CHEST - 2 VIEW  Comparison: 02/03/2011  Findings: Upper normal heart size. Normal mediastinal contours and pulmonary vascularity. Peribronchial thickening. Chronic accentuation of perihilar markings little change from previous exam. No segmental infiltrate or pleural effusion. No pneumothorax. Bones unremarkable.  IMPRESSION: Peribronchial thickening and chronic accentuation perihilar markings question related to bronchitis or asthma, difficult to exclude recurrent subtle perihilar infiltrates. No segmental consolidation identified.   Original Report Authenticated By: Ulyses Southward, M.D.   1. URI (upper respiratory infection)   2. Asthma exacerbation, mild persistent     MDM  Pt CXR negative for acute infiltrate. Patients symptoms are consistent with URI, likely viral etiology. Discussed that antibiotics are not indicated for viral infections. Lung fields improved after two nebulizer treatments. Patient maintained O2 sats above 90% while in ED. Pt states breathing is greatly improved. Pt will be discharged with symptomatic treatment.  Verbalizes understanding and is agreeable with plan. Pt is hemodynamically stable & in NAD prior to dc. Patient d/w with Dr. Judd Lien, agrees with plan. Patient is stable at time of discharge.     Jeannetta Ellis, PA-C 09/21/12 1032

## 2012-09-22 NOTE — ED Provider Notes (Signed)
Medical screening examination/treatment/procedure(s) were performed by non-physician practitioner and as supervising physician I was immediately available for consultation/collaboration.  Nikolina Simerson, MD 09/22/12 2122 

## 2013-03-14 ENCOUNTER — Emergency Department (HOSPITAL_BASED_OUTPATIENT_CLINIC_OR_DEPARTMENT_OTHER)
Admission: EM | Admit: 2013-03-14 | Discharge: 2013-03-14 | Disposition: A | Discharge status: Home / Self Care | Payer: BC Managed Care – PPO | Attending: Emergency Medicine | Admitting: Emergency Medicine

## 2013-03-14 ENCOUNTER — Encounter (HOSPITAL_BASED_OUTPATIENT_CLINIC_OR_DEPARTMENT_OTHER): Payer: Self-pay | Admitting: Emergency Medicine

## 2013-03-14 DIAGNOSIS — Z79899 Other long term (current) drug therapy: Secondary | ICD-10-CM | POA: Insufficient documentation

## 2013-03-14 DIAGNOSIS — J029 Acute pharyngitis, unspecified: Secondary | ICD-10-CM | POA: Insufficient documentation

## 2013-03-14 DIAGNOSIS — R05 Cough: Secondary | ICD-10-CM

## 2013-03-14 DIAGNOSIS — J45901 Unspecified asthma with (acute) exacerbation: Secondary | ICD-10-CM | POA: Insufficient documentation

## 2013-03-14 DIAGNOSIS — F17200 Nicotine dependence, unspecified, uncomplicated: Secondary | ICD-10-CM | POA: Insufficient documentation

## 2013-03-14 DIAGNOSIS — R062 Wheezing: Secondary | ICD-10-CM

## 2013-03-14 MED ORDER — ALBUTEROL SULFATE HFA 108 (90 BASE) MCG/ACT IN AERS
2.0000 | INHALATION_SPRAY | Freq: Once | RESPIRATORY_TRACT | Status: AC
  Administered 2013-03-14: 2 via RESPIRATORY_TRACT
  Filled 2013-03-14: qty 6.7

## 2013-03-14 MED ORDER — PENICILLIN G BENZATHINE 1200000 UNIT/2ML IM SUSP
1.2000 10*6.[IU] | Freq: Once | INTRAMUSCULAR | Status: AC
  Administered 2013-03-14: 1.2 10*6.[IU] via INTRAMUSCULAR
  Filled 2013-03-14: qty 2

## 2013-03-14 MED ORDER — IBUPROFEN 800 MG PO TABS
800.0000 mg | ORAL_TABLET | Freq: Once | ORAL | Status: AC
  Administered 2013-03-14: 800 mg via ORAL
  Filled 2013-03-14: qty 1

## 2013-03-14 NOTE — ED Notes (Signed)
Pt c/o sore throat and difficulty swallowing x3 days

## 2013-03-14 NOTE — ED Provider Notes (Signed)
This chart was scribed for Layla Maw Ward, DO by Arlan Organ, ED Scribe. This patient was seen in room [facility]HT13/[facility]13 and the patient's care was started 5:38 PM.   TIME SEEN: 5:38 PM   CHIEF COMPLAINT: Sore Throat  HPI:   HPI Comments: Renee Paul is a 27 y.o. Female with a h/o morbid obesity, asthma who presents to the Emergency Department complaining of a sore throat that initially started 3 days ago. She also reports a cough, mild SOB, and dysphagia as associated symptoms.  She states she has not been able to eat in 3 days as normal secondary to her discomfort. She denies vomiting, nausea, or diarrhea. Pt states she is currently an every day smoker.    ROS: See HPI Constitutional: no fever  Eyes: no drainage  ENT: no runny nose, positive for sore throat Cardiovascular:  no chest pain  Resp: no SOB  GI: no vomiting GU: no dysuria Integumentary: no rash  Allergy: no hives  Musculoskeletal: no leg swelling  Neurological: no slurred speech ROS otherwise negative  PAST MEDICAL HISTORY/PAST SURGICAL HISTORY:  Past Medical History  Diagnosis Date  . Asthma     MEDICATIONS:  Prior to Admission medications   Medication Sig Start Date End Date Taking? Authorizing Provider  albuterol (PROVENTIL HFA;VENTOLIN HFA) 108 (90 BASE) MCG/ACT inhaler Inhale 2 puffs into the lungs every 4 (four) hours as needed for wheezing. 09/21/12  Yes Jennifer L Piepenbrink, PA-C  ondansetron (ZOFRAN) 8 MG tablet  09/04/11   Historical Provider, MD  Prenatal Vit-Fe Fumarate-FA (PRENATAL MULTIVITAMIN) TABS Take 1 tablet by mouth daily.    Historical Provider, MD  ranitidine (ZANTAC) 300 MG tablet Take 300 mg by mouth.    Historical Provider, MD  sodium chloride (OCEAN NASAL SPRAY) 0.65 % nasal spray Place 1 spray into the nose as needed for congestion. 09/21/12   Jennifer L Piepenbrink, PA-C    ALLERGIES:  No Known Allergies  SOCIAL HISTORY:  History  Substance Use Topics  . Smoking status: Current  Every Day Smoker -- 0.25 packs/day    Types: Cigarettes  . Smokeless tobacco: Not on file  . Alcohol Use: 0.6 oz/week    1 Cans of beer per week    FAMILY HISTORY: No family history on file.  EXAM: BP 130/72  Pulse 90  Temp(Src) 98 F (36.7 C) (Oral)  Resp 16  Wt 300 lb (136.079 kg)  SpO2 99%  LMP 03/01/2013 CONSTITUTIONAL: Alert and oriented and responds appropriately to questions. Well-appearing; well-nourished HEAD: Normocephalic EYES: Conjunctivae clear, PERRL ENT: normal nose; no rhinorrhea; moist mucous membranes; pt has bilateral tonsillar hypertrophy and exudates with no trismus or drooling or uvular deviation, no muffled voice NECK: Supple, no meningismus, no LAD  CARD: RRR; S1 and S2 appreciated; no murmurs, no clicks, no rubs, no gallops RESP: Normal chest excursion without splinting or tachypnea; breath sounds clear and equal bilaterally; no wheezes, no rhonchi, no rales,  ABD/GI: Normal bowel sounds; non-distended; soft, non-tender, no rebound, no guarding BACK:  The back appears normal and is non-tender to palpation, there is no CVA tenderness EXT: Normal ROM in all joints; non-tender to palpation; no edema; normal capillary refill; no cyanosis    SKIN: Normal color for age and race; warm NEURO: Moves all extremities equally PSYCH: The patient's mood and manner are appropriate. Grooming and personal hygiene are appropriate.  MEDICAL DECISION MAKING: Strep test negative but given exam, will treat with IM PCN.  Pt also reports wheezing but lungs  CTAB, no resp distress, no hypoxia.  Will dc with inhaler.  Given return precautions, PCP follow up info, supportive care instructions.    I personally performed the services described in this documentation, which was scribed in my presence. The recorded information has been reviewed and is accurate.   Layla Maw Ward, DO 03/15/13 1958

## 2013-03-17 LAB — CULTURE, GROUP A STREP

## 2013-03-23 ENCOUNTER — Emergency Department (HOSPITAL_BASED_OUTPATIENT_CLINIC_OR_DEPARTMENT_OTHER)
Admission: EM | Admit: 2013-03-23 | Discharge: 2013-03-23 | Disposition: A | Discharge status: Home / Self Care | Payer: BC Managed Care – PPO | Attending: Emergency Medicine | Admitting: Emergency Medicine

## 2013-03-23 ENCOUNTER — Encounter (HOSPITAL_BASED_OUTPATIENT_CLINIC_OR_DEPARTMENT_OTHER): Payer: Self-pay | Admitting: Emergency Medicine

## 2013-03-23 DIAGNOSIS — J45909 Unspecified asthma, uncomplicated: Secondary | ICD-10-CM | POA: Insufficient documentation

## 2013-03-23 DIAGNOSIS — F17200 Nicotine dependence, unspecified, uncomplicated: Secondary | ICD-10-CM | POA: Insufficient documentation

## 2013-03-23 DIAGNOSIS — Z79899 Other long term (current) drug therapy: Secondary | ICD-10-CM | POA: Insufficient documentation

## 2013-03-23 DIAGNOSIS — J029 Acute pharyngitis, unspecified: Secondary | ICD-10-CM | POA: Insufficient documentation

## 2013-03-23 LAB — RAPID STREP SCREEN (MED CTR MEBANE ONLY): Streptococcus, Group A Screen (Direct): NEGATIVE

## 2013-03-23 MED ORDER — DEXAMETHASONE SODIUM PHOSPHATE 10 MG/ML IJ SOLN
10.0000 mg | Freq: Once | INTRAMUSCULAR | Status: AC
  Administered 2013-03-23: 10 mg via INTRAMUSCULAR
  Filled 2013-03-23: qty 1

## 2013-03-23 NOTE — ED Provider Notes (Signed)
CSN: [id number]     Arrival date & time 03/23/13  0802 History   First MD Initiated Contact with Patient 03/23/13 [phone]     Chief Complaint  Patient presents with  . Sore Throat   (Consider location/radiation/quality/duration/timing/severity/associated sxs/prior Treatment) Patient is a 27 y.o. female presenting with pharyngitis.  Sore Throat   Pt with recent visit for pharyngitis, treated empirically for strep with IM PCN although culture came back neg. Reports continued sore throat, post-nasal drip and dry cough. No fever. She has had moderate aching pain in throat, worse with swallowing. Noticed swelling on her uvula.   Past Medical History  Diagnosis Date  . Asthma    Past Surgical History  Procedure Laterality Date  . Cesarean section    . Cesarean section    . Intrauterine device insertion    . Iud removal    . Tubal ligation     No family history on file. History  Substance Use Topics  . Smoking status: Current Every Day Smoker -- 0.25 packs/day    Types: Cigarettes  . Smokeless tobacco: Not on file  . Alcohol Use: 0.6 oz/week    1 Cans of beer per week   OB History   Grav Para Term Preterm Abortions TAB SAB Ect Mult Living   3 2 2  0 0 0 0 0 0 2     Review of Systems All other systems reviewed and are negative except as noted in HPI.   Allergies  Review of patient's allergies indicates no known allergies.  Home Medications   Current Outpatient Rx  Name  Route  Sig  Dispense  Refill  . albuterol (PROVENTIL HFA;VENTOLIN HFA) 108 (90 BASE) MCG/ACT inhaler   Inhalation   Inhale 2 puffs into the lungs every 4 (four) hours as needed for wheezing.   6.7 g   1   . ondansetron (ZOFRAN) 8 MG tablet               . Prenatal Vit-Fe Fumarate-FA (PRENATAL MULTIVITAMIN) TABS   Oral   Take 1 tablet by mouth daily.         . ranitidine (ZANTAC) 300 MG tablet   Oral   Take 300 mg by mouth.         . sodium chloride (OCEAN NASAL SPRAY) 0.65 % nasal spray   Nasal   Place 1 spray into the nose as needed for congestion.   30 mL   0    BP 130/87  Pulse 80  Temp(Src) 97.6 F (36.4 C) (Oral)  Resp 20  Ht 5\' 4"  (1.626 m)  Wt 330 lb (149.687 kg)  BMI 56.62 kg/m2  SpO2 98%  LMP 03/01/2013 Physical Exam  Nursing note and vitals reviewed. Constitutional: She is oriented to person, place, and time. She appears well-developed and well-nourished.  HENT:  Head: Normocephalic and atraumatic.  Uvula moderately swollen, no tonsilar swelling or edema, no fluctuance in tonsils, posterior pharyngeal edema  Eyes: EOM are normal. Pupils are equal, round, and reactive to light.  Neck: Normal range of motion. Neck supple.  Cardiovascular: Normal rate, normal heart sounds and intact distal pulses.   Pulmonary/Chest: Effort normal and breath sounds normal.  Abdominal: Bowel sounds are normal. She exhibits no distension. There is no tenderness.  Musculoskeletal: Normal range of motion. She exhibits no edema and no tenderness.  Lymphadenopathy:    She has no cervical adenopathy.  Neurological: She is alert and oriented to person, place, and time. She has  normal strength. No cranial nerve deficit or sensory deficit.  Skin: Skin is warm and dry. No rash noted.  Psychiatric: She has a normal mood and affect.    ED Course  Procedures (including critical care time) Labs Review Labs Reviewed  RAPID STREP SCREEN   Imaging Review No results found.  EKG Interpretation   None       MDM   1. Viral pharyngitis     Pt with continued symptoms of presumed viral pharyngitis. Advised OTC asymptomatic care, decadron IM here for swelling. Followup PRN.     Charles B. Bernette Mayers, MD 03/23/13 [phone]

## 2013-03-23 NOTE — ED Notes (Signed)
Pt reports a sore throat x 1 week.  Was seen in the ED  And received IM ABX but not improving.

## 2013-04-29 ENCOUNTER — Emergency Department (HOSPITAL_BASED_OUTPATIENT_CLINIC_OR_DEPARTMENT_OTHER)
Admission: EM | Admit: 2013-04-29 | Discharge: 2013-04-29 | Disposition: A | Discharge status: Home / Self Care | Payer: Self-pay | Attending: Emergency Medicine | Admitting: Emergency Medicine

## 2013-04-29 ENCOUNTER — Encounter (HOSPITAL_BASED_OUTPATIENT_CLINIC_OR_DEPARTMENT_OTHER): Payer: Self-pay | Admitting: Emergency Medicine

## 2013-04-29 ENCOUNTER — Emergency Department (HOSPITAL_BASED_OUTPATIENT_CLINIC_OR_DEPARTMENT_OTHER): Payer: Self-pay

## 2013-04-29 DIAGNOSIS — J45909 Unspecified asthma, uncomplicated: Secondary | ICD-10-CM

## 2013-04-29 DIAGNOSIS — J039 Acute tonsillitis, unspecified: Secondary | ICD-10-CM

## 2013-04-29 DIAGNOSIS — J45901 Unspecified asthma with (acute) exacerbation: Secondary | ICD-10-CM | POA: Insufficient documentation

## 2013-04-29 DIAGNOSIS — Z79899 Other long term (current) drug therapy: Secondary | ICD-10-CM | POA: Insufficient documentation

## 2013-04-29 DIAGNOSIS — F17200 Nicotine dependence, unspecified, uncomplicated: Secondary | ICD-10-CM | POA: Insufficient documentation

## 2013-04-29 LAB — RAPID STREP SCREEN (MED CTR MEBANE ONLY): STREPTOCOCCUS, GROUP A SCREEN (DIRECT): NEGATIVE

## 2013-04-29 MED ORDER — PREDNISONE 10 MG PO TABS: 20.0000 mg | ORAL_TABLET | Freq: Every day | ORAL | Status: DC

## 2013-04-29 MED ORDER — IPRATROPIUM-ALBUTEROL 0.5-2.5 (3) MG/3ML IN SOLN
3.0000 mL | RESPIRATORY_TRACT | Status: DC
Start: 2013-04-29 — End: 2013-04-30
  Administered 2013-04-29: 3 mL via RESPIRATORY_TRACT
  Filled 2013-04-29: qty 3

## 2013-04-29 MED ORDER — PREDNISONE 20 MG PO TABS
40.0000 mg | ORAL_TABLET | Freq: Once | ORAL | Status: AC
  Administered 2013-04-29: 40 mg via ORAL
  Filled 2013-04-29: qty 2

## 2013-04-29 MED ORDER — CLINDAMYCIN HCL 150 MG PO CAPS: 150.0000 mg | ORAL_CAPSULE | Freq: Four times a day (QID) | ORAL | Status: DC

## 2013-04-29 MED ORDER — CLINDAMYCIN HCL 150 MG PO CAPS
300.0000 mg | ORAL_CAPSULE | Freq: Once | ORAL | Status: AC
  Administered 2013-04-29: 300 mg via ORAL
  Filled 2013-04-29: qty 2

## 2013-04-29 NOTE — ED Notes (Signed)
Strep sample sent to lab

## 2013-04-29 NOTE — ED Notes (Signed)
Pt c/o URi symptoms x 3 days  

## 2013-04-29 NOTE — ED Provider Notes (Signed)
Medical screening examination/treatment/procedure(s) were performed by non-physician practitioner and as supervising physician I was immediately available for consultation/collaboration.  EKG Interpretation   None         Charles B. Sheldon, MD 04/29/13 2320 

## 2013-04-29 NOTE — ED Provider Notes (Signed)
CSN: [id number]     Arrival date & time 04/29/13  2108 History   First MD Initiated Contact with Patient 04/29/13 2121     Chief Complaint  Patient presents with  . URI   (Consider location/radiation/quality/duration/timing/severity/associated sxs/prior Treatment) Patient is a 28 y.o. female presenting with URI. The history is provided by the patient.  URI Presenting symptoms: congestion, cough, fever and sore throat   Presenting symptoms: no ear pain   Severity:  Moderate Onset quality:  Gradual Duration:  1 week Timing:  Constant Progression:  Worsening Chronicity:  Recurrent Relieved by:  None tried Worsened by:  Drinking and eating Ineffective treatments:  None tried Associated symptoms: swollen glands and wheezing   Associated symptoms: no headaches    Renee Paul is a 28 y.o. female who presents to the ED with a sore throat, cough and wheezing that started a week ago. She has asthma and smokes a pack of cigarettes per day. She has been in two other times for sore throat and tonsillitis. The thing that bothers her most tonight is the sore throat.     Past Medical History  Diagnosis Date  . Asthma    Past Surgical History  Procedure Laterality Date  . Cesarean section    . Cesarean section    . Intrauterine device insertion    . Iud removal    . Tubal ligation     History reviewed. No pertinent family history. History  Substance Use Topics  . Smoking status: Current Every Day Smoker -- 0.25 packs/day    Types: Cigarettes  . Smokeless tobacco: Not on file  . Alcohol Use: 0.6 oz/week    1 Cans of beer per week   OB History   Grav Para Term Preterm Abortions TAB SAB Ect Mult Living   3 2 2  0 0 0 0 0 0 2     Review of Systems  Constitutional: Positive for fever.  HENT: Positive for congestion and sore throat. Negative for ear pain and trouble swallowing.   Respiratory: Positive for cough and wheezing. Negative for shortness of breath.   Gastrointestinal:  Negative for nausea, vomiting and abdominal pain.  Genitourinary: Negative for urgency.  Skin: Negative for rash.  Neurological: Negative for dizziness and headaches.  Psychiatric/Behavioral: The patient is not nervous/anxious.     Allergies  Review of patient's allergies indicates no known allergies.  Home Medications   Current Outpatient Rx  Name  Route  Sig  Dispense  Refill  . albuterol (PROVENTIL HFA;VENTOLIN HFA) 108 (90 BASE) MCG/ACT inhaler   Inhalation   Inhale 2 puffs into the lungs every 4 (four) hours as needed for wheezing.   6.7 g   1   . ondansetron (ZOFRAN) 8 MG tablet               . Prenatal Vit-Fe Fumarate-FA (PRENATAL MULTIVITAMIN) TABS   Oral   Take 1 tablet by mouth daily.         . ranitidine (ZANTAC) 300 MG tablet   Oral   Take 300 mg by mouth.         . sodium chloride (OCEAN NASAL SPRAY) 0.65 % nasal spray   Nasal   Place 1 spray into the nose as needed for congestion.   30 mL   0    BP 140/77  Pulse 88  Temp(Src) 98.7 F (37.1 C)  Resp 16  Ht 5\' 4"  (1.626 m)  Wt 380 lb (172.367 kg)  BMI 65.19  kg/m2  SpO2 97%  LMP 04/20/2013 Physical Exam  Nursing note and vitals reviewed. Constitutional: She is oriented to person, place, and time.  Morbidly obese  HENT:  Head: Normocephalic.  Right Ear: Tympanic membrane normal.  Left Ear: Tympanic membrane normal.  Nose: Rhinorrhea present.  Mouth/Throat: Uvula is midline and mucous membranes are normal. Posterior oropharyngeal erythema present. No tonsillar abscesses.  Right tonsil enlarged with exudate.   Eyes: Conjunctivae and EOM are normal.  Neck: Neck supple.  Cardiovascular: Normal rate and regular rhythm.   Pulmonary/Chest: Effort normal. She has wheezes. She has no rales. She exhibits no tenderness.  Musculoskeletal: Normal range of motion.  Lymphadenopathy:    She has cervical adenopathy (right).  Neurological: She is alert and oriented to person, place, and time. No  cranial nerve deficit.  Skin: Skin is warm and dry.  Psychiatric: She has a normal mood and affect. Her behavior is normal.    ED Course  Procedures (including critical care time) Labs Review Labs Reviewed  RAPID STREP SCREEN  CULTURE, GROUP A STREP   Imaging Review Dg Chest 2 View  04/29/2013   CLINICAL DATA:  Cough, congestion  EXAM: CHEST  2 VIEW  COMPARISON:  DG CHEST 2 VIEW dated 09/21/2012  FINDINGS: There is prominence of interstitial markings. No focal regions of consolidation identified. Mild peribronchial cuffing is appreciated. The cardiac silhouette is within normal limits. The osseous structures are unremarkable.  IMPRESSION: No focal regions of consolidation. There are findings consistent with an interstitial infiltrate which raise concern of sequela bronchitis. Surveillance evaluation recommended.   Electronically Signed   By: Margaree Mackintosh M.D.   On: 04/29/2013 22:16     MDM  28 y.o. female with enlarged tonsil with exudate and asthmatic bronchitis. Discussed with the patient importance of follow up with ENT since she has had several visit for tonsillitis. Patient stable for discharge with normal vital signs and O2 SAT 100% on R/A.  Discussed with the patient and all questioned fully answered. She will return for worsening symptoms. She will follow up with ENT.    Medication List    TAKE these medications       clindamycin 150 MG capsule  Commonly known as:  CLEOCIN  Take 1 capsule (150 mg total) by mouth every 6 (six) hours.     predniSONE 10 MG tablet  Commonly known as:  DELTASONE  Take 2 tablets (20 mg total) by mouth daily.      ASK your doctor about these medications       albuterol 108 (90 BASE) MCG/ACT inhaler  Commonly known as:  PROVENTIL HFA;VENTOLIN HFA  Inhale 2 puffs into the lungs every 4 (four) hours as needed for wheezing.     ondansetron 8 MG tablet  Commonly known as:  ZOFRAN     prenatal multivitamin Tabs tablet  Take 1 tablet by mouth  daily.     ranitidine 300 MG tablet  Commonly known as:  ZANTAC  Take 300 mg by mouth.     sodium chloride 0.65 % nasal spray  Commonly known as:  OCEAN NASAL SPRAY  Place 1 spray into the nose as needed for congestion.           [name] Murrain, [place] 04/29/13 2245

## 2013-05-02 LAB — CULTURE, GROUP A STREP

## 2013-09-22 ENCOUNTER — Encounter (HOSPITAL_BASED_OUTPATIENT_CLINIC_OR_DEPARTMENT_OTHER): Payer: Self-pay | Admitting: Emergency Medicine

## 2013-09-22 ENCOUNTER — Emergency Department (HOSPITAL_BASED_OUTPATIENT_CLINIC_OR_DEPARTMENT_OTHER)
Admission: EM | Admit: 2013-09-22 | Discharge: 2013-09-22 | Disposition: A | Discharge status: Home / Self Care | Payer: BC Managed Care – PPO | Attending: Emergency Medicine | Admitting: Emergency Medicine

## 2013-09-22 DIAGNOSIS — Z792 Long term (current) use of antibiotics: Secondary | ICD-10-CM | POA: Insufficient documentation

## 2013-09-22 DIAGNOSIS — F17200 Nicotine dependence, unspecified, uncomplicated: Secondary | ICD-10-CM | POA: Insufficient documentation

## 2013-09-22 DIAGNOSIS — H6691 Otitis media, unspecified, right ear: Secondary | ICD-10-CM

## 2013-09-22 DIAGNOSIS — J45909 Unspecified asthma, uncomplicated: Secondary | ICD-10-CM | POA: Insufficient documentation

## 2013-09-22 DIAGNOSIS — H6690 Otitis media, unspecified, unspecified ear: Secondary | ICD-10-CM | POA: Insufficient documentation

## 2013-09-22 DIAGNOSIS — IMO0002 Reserved for concepts with insufficient information to code with codable children: Secondary | ICD-10-CM | POA: Insufficient documentation

## 2013-09-22 DIAGNOSIS — Z79899 Other long term (current) drug therapy: Secondary | ICD-10-CM | POA: Insufficient documentation

## 2013-09-22 HISTORY — DX: Morbid (severe) obesity due to excess calories: E66.01

## 2013-09-22 MED ORDER — AMOXICILLIN 500 MG PO CAPS: 500.0000 mg | ORAL_CAPSULE | Freq: Three times a day (TID) | ORAL | Status: DC

## 2013-09-22 MED ORDER — AMOXICILLIN 500 MG PO CAPS
500.0000 mg | ORAL_CAPSULE | Freq: Once | ORAL | Status: AC
  Administered 2013-09-22: 500 mg via ORAL
  Filled 2013-09-22: qty 1

## 2013-09-22 MED ORDER — ANTIPYRINE-BENZOCAINE 5.4-1.4 % OT SOLN
3.0000 [drp] | OTIC | Status: DC | PRN
  Administered 2013-09-22: 4 [drp] via OTIC
  Filled 2013-09-22: qty 10

## 2013-09-22 NOTE — Discharge Instructions (Signed)
Otitis Externa Otitis externa is a germ infection in the outer ear. The outer ear is the area from the eardrum to the outside of the ear. Otitis externa is sometimes called "swimmer's ear." HOME CARE  Put drops in the ear as told by your doctor.  Only take medicine as told by your doctor.  If you have diabetes, your doctor may give you more directions. Follow your doctor's directions.  Keep all doctor visits as told. To avoid another infection:  Keep your ear dry. Use the corner of a towel to dry your ear after swimming or bathing.  Avoid scratching or putting things inside your ear.  Avoid swimming in lakes, dirty water, or pools that use a chemical called chlorine poorly.  You may use ear drops after swimming. Combine equal amounts of white vinegar and alcohol in a bottle. Put 3 or 4 drops in each ear. GET HELP RIGHT AWAY IF:   You have a fever.  Your ear is still red, puffy (swollen), or painful after 3 days.  You still have yellowish-white fluid (pus) coming from the ear after 3 days.  Your redness, puffiness, or pain gets worse.  You have a really bad headache.  You have redness, puffiness, pain, or tenderness behind your ear. MAKE SURE YOU:   Understand these instructions.  Will watch your condition.  Will get help right away if you are not doing well or get worse. Document Released: 08/28/2007 Document Revised: 06/03/2011 Document Reviewed: 03/28/2011 [facility] Patient Information 2015 [place], [place]. This information is not intended to replace advice given to you by your health care provider. Make sure you discuss any questions you have with your health care provider.

## 2013-09-22 NOTE — ED Notes (Signed)
Right ear pain x 3 days with right side of tongue feeling numb

## 2013-09-22 NOTE — ED Provider Notes (Signed)
CSN: [id number]     Arrival date & time 09/22/13  2121 History  This chart was scribed for Renee Furry, MD by Ludger Nutting, ED Scribe. This patient was seen in room MH05/MH05 and the patient's care was started 10:44 PM.    Chief Complaint  Patient presents with  . Otalgia      The history is provided by the patient. No language interpreter was used.    HPI Comments: Renee Paul is a 28 y.o. female who presents to the Emergency Department complaining of 2-3 days of gradual onset, constant, gradually worsening right-sided otalgia. She describes the pain as throbbing. She also has associated tingling sensations to the right tongue. She has taken 1-2 doses of clindamycin today. She denies any other symptoms at this time.   Past Medical History  Diagnosis Date  . Asthma   . Morbid obesity    Past Surgical History  Procedure Laterality Date  . Cesarean section    . Cesarean section    . Intrauterine device insertion    . Iud removal    . Tubal ligation     No family history on file. History  Substance Use Topics  . Smoking status: Current Every Day Smoker -- 0.25 packs/day    Types: Cigarettes  . Smokeless tobacco: Not on file  . Alcohol Use: 0.6 oz/week    1 Cans of beer per week   OB History   Grav Para Term Preterm Abortions TAB SAB Ect Mult Living   3 2 2  0 0 0 0 0 0 2     Review of Systems  Constitutional: Negative for fever, chills, diaphoresis, appetite change and fatigue.  HENT: Positive for ear pain (right). Negative for mouth sores, sore throat and trouble swallowing.   Eyes: Negative for visual disturbance.  Respiratory: Negative for cough, chest tightness, shortness of breath and wheezing.   Cardiovascular: Negative for chest pain.  Gastrointestinal: Negative for nausea, vomiting, abdominal pain, diarrhea and abdominal distention.  Endocrine: Negative for polydipsia, polyphagia and polyuria.  Genitourinary: Negative for dysuria, frequency and hematuria.   Musculoskeletal: Negative for gait problem.  Skin: Negative for color change, pallor and rash.  Neurological: Negative for dizziness, syncope, light-headedness and headaches.  Hematological: Does not bruise/bleed easily.  Psychiatric/Behavioral: Negative for behavioral problems and confusion.      Allergies  Review of patient's allergies indicates no known allergies.  Home Medications   Prior to Admission medications   Medication Sig Start Date End Date Taking? Authorizing Provider  albuterol (PROVENTIL HFA;VENTOLIN HFA) 108 (90 BASE) MCG/ACT inhaler Inhale 2 puffs into the lungs every 4 (four) hours as needed for wheezing. 09/21/12   Jennifer L Piepenbrink, PA-C  amoxicillin (AMOXIL) 500 MG capsule Take 1 capsule (500 mg total) by mouth 3 (three) times daily. 09/22/13   Renee Furry, MD  clindamycin (CLEOCIN) 150 MG capsule Take 1 capsule (150 mg total) by mouth every 6 (six) hours. 04/29/13   [place], NP  ondansetron [facility]) 8 MG tablet  09/04/11   Historical Provider, MD  predniSONE (DELTASONE) 10 MG tablet Take 2 tablets (20 mg total) by mouth daily. 04/29/13   [place], NP  Prenatal Vit-Fe Fumarate-FA (PRENATAL MULTIVITAMIN) TABS Take 1 tablet by mouth daily.    Historical Provider, MD  ranitidine (ZANTAC) 300 MG tablet Take 300 mg by mouth.    Historical Provider, MD  sodium chloride (OCEAN NASAL SPRAY) 0.65 % nasal spray Place 1 spray into the nose as needed  for congestion. 09/21/12   Jennifer L Piepenbrink, PA-C   BP 139/85  Pulse 95  Temp(Src) 98 F (36.7 C) (Oral)  Resp 20  SpO2 98% Physical Exam  Nursing note and vitals reviewed. Constitutional: She is oriented to person, place, and time. She appears well-developed and well-nourished. No distress.  HENT:  Head: Normocephalic.  Right Ear: Tympanic membrane is erythematous and bulging.  Left Ear: Tympanic membrane, external ear and ear canal normal.  No identifiable landmarks.   Eyes: Conjunctivae are normal. Pupils  are equal, round, and reactive to light. No scleral icterus.  Neck: Normal range of motion. Neck supple. No thyromegaly present.  Cardiovascular: Normal rate and regular rhythm.  Exam reveals no gallop and no friction rub.   No murmur heard. Pulmonary/Chest: Effort normal and breath sounds normal. No respiratory distress. She has no wheezes. She has no rales.  Abdominal: Soft. Bowel sounds are normal. She exhibits no distension. There is no tenderness. There is no rebound.  Musculoskeletal: Normal range of motion.  Neurological: She is alert and oriented to person, place, and time.  Skin: Skin is warm and dry. No rash noted.  Psychiatric: She has a normal mood and affect. Her behavior is normal.    ED Course  Procedures (including critical care time)  DIAGNOSTIC STUDIES: Oxygen Saturation is 98% on RA, normal by my interpretation.    COORDINATION OF CARE: 10:46 PM Discussed treatment plan with pt at bedside and pt agreed to plan.   Labs Review Labs Reviewed - No data to display  Imaging Review No results found.   EKG Interpretation None      MDM   Final diagnoses:  Acute right otitis media, recurrence not specified, unspecified otitis media type    No lesions in the ear or face her nose that suggest this is zoster. No mastoid tenderness. Normal facial nerve function no sign of Bell's palsy. She states she felt tingling in her tongue earlier but feels normal now. She hasrmal tongue protrusion. Denies lack of sensation of the tongue otherwise normal exam other than obvious bulging TM from otitis media.  I personally performed the services described in this documentation, which was scribed in my presence. The recorded information has been reviewed and is accurate.   Renee Furry, MD 09/22/13 2303

## 2013-09-28 ENCOUNTER — Emergency Department (HOSPITAL_BASED_OUTPATIENT_CLINIC_OR_DEPARTMENT_OTHER)
Admission: EM | Admit: 2013-09-28 | Discharge: 2013-09-28 | Disposition: A | Discharge status: Home / Self Care | Payer: Self-pay | Attending: Emergency Medicine | Admitting: Emergency Medicine

## 2013-09-28 ENCOUNTER — Emergency Department (HOSPITAL_BASED_OUTPATIENT_CLINIC_OR_DEPARTMENT_OTHER): Payer: Self-pay

## 2013-09-28 ENCOUNTER — Encounter (HOSPITAL_BASED_OUTPATIENT_CLINIC_OR_DEPARTMENT_OTHER): Payer: Self-pay | Admitting: Emergency Medicine

## 2013-09-28 DIAGNOSIS — F17200 Nicotine dependence, unspecified, uncomplicated: Secondary | ICD-10-CM | POA: Insufficient documentation

## 2013-09-28 DIAGNOSIS — Z792 Long term (current) use of antibiotics: Secondary | ICD-10-CM | POA: Insufficient documentation

## 2013-09-28 DIAGNOSIS — J45909 Unspecified asthma, uncomplicated: Secondary | ICD-10-CM | POA: Insufficient documentation

## 2013-09-28 DIAGNOSIS — R209 Unspecified disturbances of skin sensation: Secondary | ICD-10-CM | POA: Insufficient documentation

## 2013-09-28 DIAGNOSIS — Z79899 Other long term (current) drug therapy: Secondary | ICD-10-CM | POA: Insufficient documentation

## 2013-09-28 DIAGNOSIS — IMO0002 Reserved for concepts with insufficient information to code with codable children: Secondary | ICD-10-CM | POA: Insufficient documentation

## 2013-09-28 DIAGNOSIS — Z3202 Encounter for pregnancy test, result negative: Secondary | ICD-10-CM | POA: Insufficient documentation

## 2013-09-28 DIAGNOSIS — R202 Paresthesia of skin: Secondary | ICD-10-CM

## 2013-09-28 LAB — PREGNANCY, URINE: Preg Test, Ur: NEGATIVE

## 2013-09-28 NOTE — Discharge Instructions (Signed)
°Emergency Department Resource Guide °1) Find a Doctor and Pay Out of Pocket °Although you won't have to find out who is covered by your insurance plan, it is a good idea to ask around and get recommendations. You will then need to call the office and see if the doctor you have chosen will accept you as a new patient and what types of options they offer for patients who are self-pay. Some doctors offer discounts or will set up payment plans for their patients who do not have insurance, but you will need to ask so you aren't surprised when you get to your appointment. ° °2) Contact Your Local Health Department °Not all health departments have doctors that can see patients for sick visits, but many do, so it is worth a call to see if yours does. If you don't know where your local health department is, you can check in your phone book. The CDC also has a tool to help you locate your state's health department, and many state websites also have listings of all of their local health departments. ° °3) Find a Walk-in Clinic °If your illness is not likely to be very severe or complicated, you may want to try a walk in clinic. These are popping up all over the country in pharmacies, drugstores, and shopping centers. They're usually staffed by nurse practitioners or physician assistants that have been trained to treat common illnesses and complaints. They're usually fairly quick and inexpensive. However, if you have serious medical issues or chronic medical problems, these are probably not your best option. ° °No Primary Care Doctor: °- Call [facility] at  832-8000 - they can help you locate a primary care doctor that  accepts your insurance, provides certain services, etc. °- Physician Referral Service- 1-800-533-3463 ° °Chronic Pain Problems: °Organization         Address  Phone   Notes  °[facility]  (336) 297-2271 Patients need to be referred by their primary care doctor.  ° °Medication  Assistance: °Organization         Address  Phone   Notes  °[place] [facility] [address] °[place], [place] [address] (336) 641-8030 --Must be a resident of [place] °-- Must have NO insurance coverage whatsoever (no Medicaid/ Medicare, etc.) °-- The pt. MUST have a primary care doctor that directs their care regularly and follows them in the community °  °[facility]  (866) 331-1348   °[employer]  (888) 892-1162   ° °Agencies that provide inexpensive medical care: °Organization         Address  Phone   Notes  °[facility]  (336) 832-8035   °[facility]    (336) 832-7272   °[facility] [address] °[place], [place] [address] (336) 832-4777   °[facility] [address], °[place] (336) 271-4999   °[facility]    (336) 373-0678   °[facility]    (336) 272-1050   °[facility] ° [address], [place] Phone:  (336) 832-4444, Fax:  (336) 832-4440 Hours of Operation:  9 am - 6 pm, M-F.  Also accepts Medicaid/Medicare and self-pay.  ° [facility] ° [address], [place] Phone: (336) 832-3150, Fax: (336) [phone]. Hours of Operation:  8:30 am - 5:30 pm, M-F.  Also accepts Medicaid and self-pay.  °[facility] [place] [address], [place] Phone: (336) 878-6027   °[facility] [address], [place], [place] (336)723-1848, Ext. 123 Mondays & Thursdays: 7-9 AM.  First 15 patients are seen on a first come, first serve basis. °  ° °Medicaid-accepting [place] Providers: ° °Organization         Address  Phone   Notes  °[facility] [address], [place] (336) 641-2100 Also accepts self-pay patients.  °[facility] [address], [place] ° (336) 856-9996   °[facility] [address], [place]  (336) 288-8857   °[facility] [address] [place] [address], [place] (336) 299-7000   °Veita Bland [address], [place]  ° (336) 373-1557 Only accepts [place] Access Medicaid patients after they have their name applied to their card.  ° °Self-Pay (no insurance) in [place]: ° °Organization         Address  Phone   Notes  °Sickle Cell Patients, [facility] [address], [place] (336) 832-1970   °[facility] [address], [place] (336) 832-4400   °[facility] ° [address], [place] (336) 992-4800   °[facility]/Dr. Osei-Bonsu ° [address] [place] [address], [place] or [address], [place] (336) [phone] Phone number for both [place] and [place] locations is the same.  °[facility] [address], [place] (336) 299-0000   °[facility] [address] [place] [address], [place] or [address] (336) 852-7530 °(336) 878-2260   °[facility] [address], [place] (336) 350-1642, phone; (336) 294-5005, fax Sees patients 1st and 3rd Saturday of every month.  Must not qualify for public or private insurance (i.e. Medicaid, Medicare, [place] Health Choice, Veterans' Benefits) • Household income should be no more than 200% of the poverty level •The clinic cannot treat you if you are pregnant or think you are pregnant • Sexually transmitted diseases are not treated at the clinic.  ° ° °Dental Care: °Organization         Address  Phone  Notes  °[place] [facility] [address], [place] (336) 641-6152 Accepts children up to age 21 who are enrolled in Medicaid or [place] Health Choice; pregnant women with a Medicaid card; and children who have applied for Medicaid or [place] Health Choice, but were declined, whose parents can pay a reduced fee at time of service.  °[place]  [facility] [place]  [address], [place] (336) 641-7733 Accepts children up to age 21 who are enrolled in Medicaid or [place] Health Choice; pregnant women with a Medicaid card; and children who have applied for Medicaid or  Health Choice, but were declined, whose parents can pay a reduced fee at time of service.  °[facility] ° [address],  (336) 641-4533 Patients are seen by appointment only. Walk-ins are not accepted. [facility] will see patients 18 years of age and older. °Monday - Tuesday (8am-5pm) °Most Wednesdays (8:30-5pm) °$30 per visit, cash only  °[facility] ° [address], [place] (336) 641-4533 Patients are seen by appointment only. Walk-ins are not accepted. [facility] will see patients 18 years of age and older. °One   Wednesday Evening (Monthly: Volunteer Based).  $30 per visit, cash only  °[facility]  (919) 537-3737 for adults; Children under age 4, call Graduate Pediatric Dentistry at ([phone]) [phone]. Children aged 4-14, please call (919) [phone] to request a pediatric application. ° Dental services are provided in all areas of dental care including fillings, crowns and bridges, complete and partial dentures, implants, gum treatment, root canals, and extractions. Preventive care is also provided. Treatment is provided to both adults and children. °Patients are selected via a lottery and there is often a waiting list. °  °[facility] [address], °[place] ° (336) 763-8833 [email or web address] °  °[facility] [address], [place], [place] (336)723-1848, Ext. 123 Second and Fourth Thursday of each month, opens at 6:30 AM; Clinic ends at 9 AM.  Patients are seen on a first-come first-served basis, and a limited number are seen during each clinic.  ° °[facility] ° [address], [place], [place] (336) [phone]    Eligibility Requirements °You must have lived in [place], [place], or [place] counties for at least the last three months. °  You cannot be eligible for state or federal sponsored healthcare insurance, including Veterans Administration, Medicaid, or Medicare. °  You generally cannot be eligible for healthcare insurance through your employer.  °  How to apply: °Eligibility screenings are held every Tuesday and Wednesday afternoon from 1:00 pm until 4:00 pm. You do not need an appointment for the interview!  °[facility] [address], [place], [place] 336-631-2330   °[place] [facility]  336-342-8273   °[place]  336-703-3100   °[facility]  336-570-6415   ° °Behavioral Health Resources in the Community: °Intensive Outpatient Programs °Organization         Address  Phone  Notes  °[facility] [address], [place], [place] 336-878-6098   °[facility] [address], [place], [place] 336-832-9800   °[facility] [address], [place], [place] ° 336-882-2125   °[facility] [address],  °[place], [place] 1-800-853-5163 or 336-641-4981   °Substance Abuse Resources °Organization         Address  Phone  Notes  °[facility]  336-882-2125   °[facility]  336-784-9470   °[facility]  336-285-9073   °[facility]  336-845-3988   °Residential & Outpatient Substance Abuse Program  1-800-659-3381   °Psychological Services °Organization         Address  Phone  Notes  ° Health  336- 832-9600   °[facility]  336- 378-7881   °[facility] [address], [place] 1-800-853-5163 or 336-641-4981   ° °Mobile Crisis Teams °Organization         Address  Phone  Notes  °[facility], Mobile Crisis Care Unit  1-877-626-1772   °Assertive °[facility] ° [address]  [place], [place] 336-834-9664   °Sharon DeEsch [address] ° [place] 336-554-5454   ° °Self-Help/Support Groups °Organization         Address  Phone             Notes  °[facility]  - variety of support groups  336- 373-1402 Call for more information  °Narcotics Anonymous (NA), [facility] [address], °[place]  2 meetings at this location  ° °  Residential Treatment Programs °Organization         Address  Phone  Notes  °[facility] [address],    °[place]  1-866-801-8205   °[facility] ° [address], [place], [place] 704-293-8524   °[facility] [address], [place] 336-845-3988 Admissions: 8am-3pm M-F  °[facility] [address]    °[place], [place] 336-841-1104   °[facility] [address], [place], [place] 336-379-7146   °[facility] [address]  °[place], [place] 336-285-9073   °[facility] - Intensive Outpatient [address], [place], [place] 336-852-3033   °[facility] [address]  °[place], [place] 1-877-615-2722 or 336-784-9470   °[facility] [address] [place], [place] 336-227-7417 Accepts Medicaid  °[facility] [address]  °[place] 1-800-659-3381 Substance Abuse/Addiction Treatment  ° °[place] Behavioral Health Resources °Organization         Address  Phone  Notes  °[facility]  (888) 581-9988   °Julie Brannon, PhD [address] [place], [place]   (336) 349-5553 or (336) 951-0000   °[facility]   [address] °[place], [place] (336) 349-4454   °[facility] [address], [place], [place] (336) 342-8316 Insurance/Medicaid/sponsorship through [facility]  °[facility] [address]                                    [place], [place] (336) 342-8316 Therapy/tele-psych/case    °[facility] [address]  ° [place], [place] (336) 349-2233    °Dr. Arfeen  (336) 349-4544   °[facility] [place]  [employer] [place] [facility] 1) [address],  °2) [address], [place] °3)  [address], [place] (336) 349-3220 °(336) 342-7768 ° °(336) 342-8140   °[place] [facility] (336) 342-1394 or (336) 342-3537 (After Hours)    ° ° °

## 2013-09-28 NOTE — ED Provider Notes (Signed)
CSN: [id number]     Arrival date & time 09/28/13  0151 History   First MD Initiated Contact with Patient 09/28/13 0205     Chief Complaint  Patient presents with  . Facial Pain     (Consider location/radiation/quality/duration/timing/severity/associated sxs/prior Treatment) The history is provided by the patient.  Patient is here for tongue numbness.  She originally stated it started today but when asked she reports it started several days prior to her last visit for ear pain.  She is still taking those antibiotics.  No f/c/r no rashes on the skin.  No HA.  No travel no visual or speech symptoms.  No CP no SOB no n/v/d.  No changes in cognition.  Has no changes in taste.  No difficulty swallowing.  No pain or rashes   Past Medical History  Diagnosis Date  . Asthma   . Morbid obesity    Past Surgical History  Procedure Laterality Date  . Cesarean section    . Cesarean section    . Intrauterine device insertion    . Iud removal    . Tubal ligation     History reviewed. No pertinent family history. History  Substance Use Topics  . Smoking status: Current Every Day Smoker -- 0.25 packs/day    Types: Cigarettes  . Smokeless tobacco: Not on file  . Alcohol Use: 0.6 oz/week    1 Cans of beer per week   OB History   Grav Para Term Preterm Abortions TAB SAB Ect Mult Living   3 2 2  0 0 0 0 0 0 2     Review of Systems  Constitutional: Negative for fever.  HENT: Negative for ear pain, facial swelling, rhinorrhea, trouble swallowing and voice change.   Eyes: Negative for photophobia and visual disturbance.  Musculoskeletal: Negative for back pain, gait problem, neck pain and neck stiffness.  Skin: Negative for rash.  Neurological: Negative for dizziness, tremors, seizures, syncope, facial asymmetry, speech difficulty, weakness, light-headedness and headaches.  All other systems reviewed and are negative.     Allergies  Review of patient's allergies indicates no known  allergies.  Home Medications   Prior to Admission medications   Medication Sig Start Date End Date Taking? Authorizing Provider  albuterol (PROVENTIL HFA;VENTOLIN HFA) 108 (90 BASE) MCG/ACT inhaler Inhale 2 puffs into the lungs every 4 (four) hours as needed for wheezing. 09/21/12  Yes Jennifer L Piepenbrink, PA-C  amoxicillin (AMOXIL) 500 MG capsule Take 1 capsule (500 mg total) by mouth 3 (three) times daily. 09/22/13  Yes Tanna Furry, MD  clindamycin (CLEOCIN) 150 MG capsule Take 1 capsule (150 mg total) by mouth every 6 (six) hours. 04/29/13   [place], NP  ondansetron [facility]) 8 MG tablet  09/04/11   Historical Provider, MD  predniSONE (DELTASONE) 10 MG tablet Take 2 tablets (20 mg total) by mouth daily. 04/29/13   [place], NP  Prenatal Vit-Fe Fumarate-FA (PRENATAL MULTIVITAMIN) TABS Take 1 tablet by mouth daily.    Historical Provider, MD  ranitidine (ZANTAC) 300 MG tablet Take 300 mg by mouth.    Historical Provider, MD  sodium chloride (OCEAN NASAL SPRAY) 0.65 % nasal spray Place 1 spray into the nose as needed for congestion. 09/21/12   Jennifer L Piepenbrink, PA-C   BP 148/88  Pulse 85  Temp(Src) 98 F (36.7 C) (Oral)  Resp 19  SpO2 97% Physical Exam  Constitutional: She is oriented to person, place, and time. She appears well-developed and well-nourished.  No distress.  HENT:  Head: Normocephalic and atraumatic.  Right Ear: External ear normal. No mastoid tenderness. Tympanic membrane is not injected. No hemotympanum.  Left Ear: External ear normal. No mastoid tenderness. Tympanic membrane is not injected. No hemotympanum.  Mouth/Throat: Oropharynx is clear and moist. No trismus in the jaw. No dental abscesses or uvula swelling.  Eyes: Conjunctivae and EOM are normal. Pupils are equal, round, and reactive to light.  Neck: Normal range of motion. Neck supple. No tracheal deviation present.  Cardiovascular: Normal rate, regular rhythm and intact distal pulses.   Pulmonary/Chest:  Effort normal and breath sounds normal. She has no wheezes. She has no rales.  Abdominal: Soft. Bowel sounds are normal. There is no tenderness. There is no rebound and no guarding.  Musculoskeletal: Normal range of motion. She exhibits no tenderness.  Lymphadenopathy:    She has no cervical adenopathy.  Neurological: She is alert and oriented to person, place, and time. She has normal reflexes. She displays normal reflexes. No cranial nerve deficit. She exhibits normal muscle tone. Coordination normal.  5/5 strength x 4 intact sensation to all extremities  Skin: Skin is warm and dry.  Psychiatric: She has a normal mood and affect.    ED Course  Procedures (including critical care time) Labs Review Labs Reviewed  PREGNANCY, URINE    Imaging Review Ct Head Wo Contrast  09/28/2013   CLINICAL DATA:  Facial numbness.  Possible stroke symptoms.  EXAM: CT HEAD WITHOUT CONTRAST  TECHNIQUE: Contiguous axial images were obtained from the base of the skull through the vertex without intravenous contrast.  COMPARISON:  None.  FINDINGS: No evidence of intracranial hemorrhage, brain edema, or other signs of acute infarction. No evidence of intracranial mass lesion or mass effect. No abnormal extraaxial fluid collections identified. Ventricles are normal in size. No skull abnormality identified.  IMPRESSION: Negative noncontrast head CT.   Electronically Signed   By: Earle Gell M.D.   On: 09/28/2013 02:58     EKG Interpretation None      MDM   Final diagnoses:  Paresthesias   Suspect vitamin deficiency.  Sensation to tongue tested and is intact.  Unable to elicit numbness on exam.     Cranials nerves tested and intact.  No loss of taste.  Recommend close follow up with PMD.  No signs of CVA.  Continue amoxicillin.      Carlisle Beers, MD 09/28/13 [phone]

## 2013-09-28 NOTE — ED Notes (Signed)
MD at bedside. 

## 2013-09-28 NOTE — ED Notes (Signed)
C/o facial numbness since earlier tonight. Pt has no facial droop, able to speak in complete sentences, NAD notes.

## 2013-12-19 IMAGING — CR DG CHEST 2V
2 series · 2 of 2 positions shown · non-contrast
Comparison: 02/03/2011

CLINICAL DATA: URI symptoms for past 2 days, shortness of breath,
shortness of breath with exertion, chills at night, congestion,
history asthma, smoking

CHEST - 2 VIEW

[w chest pa]
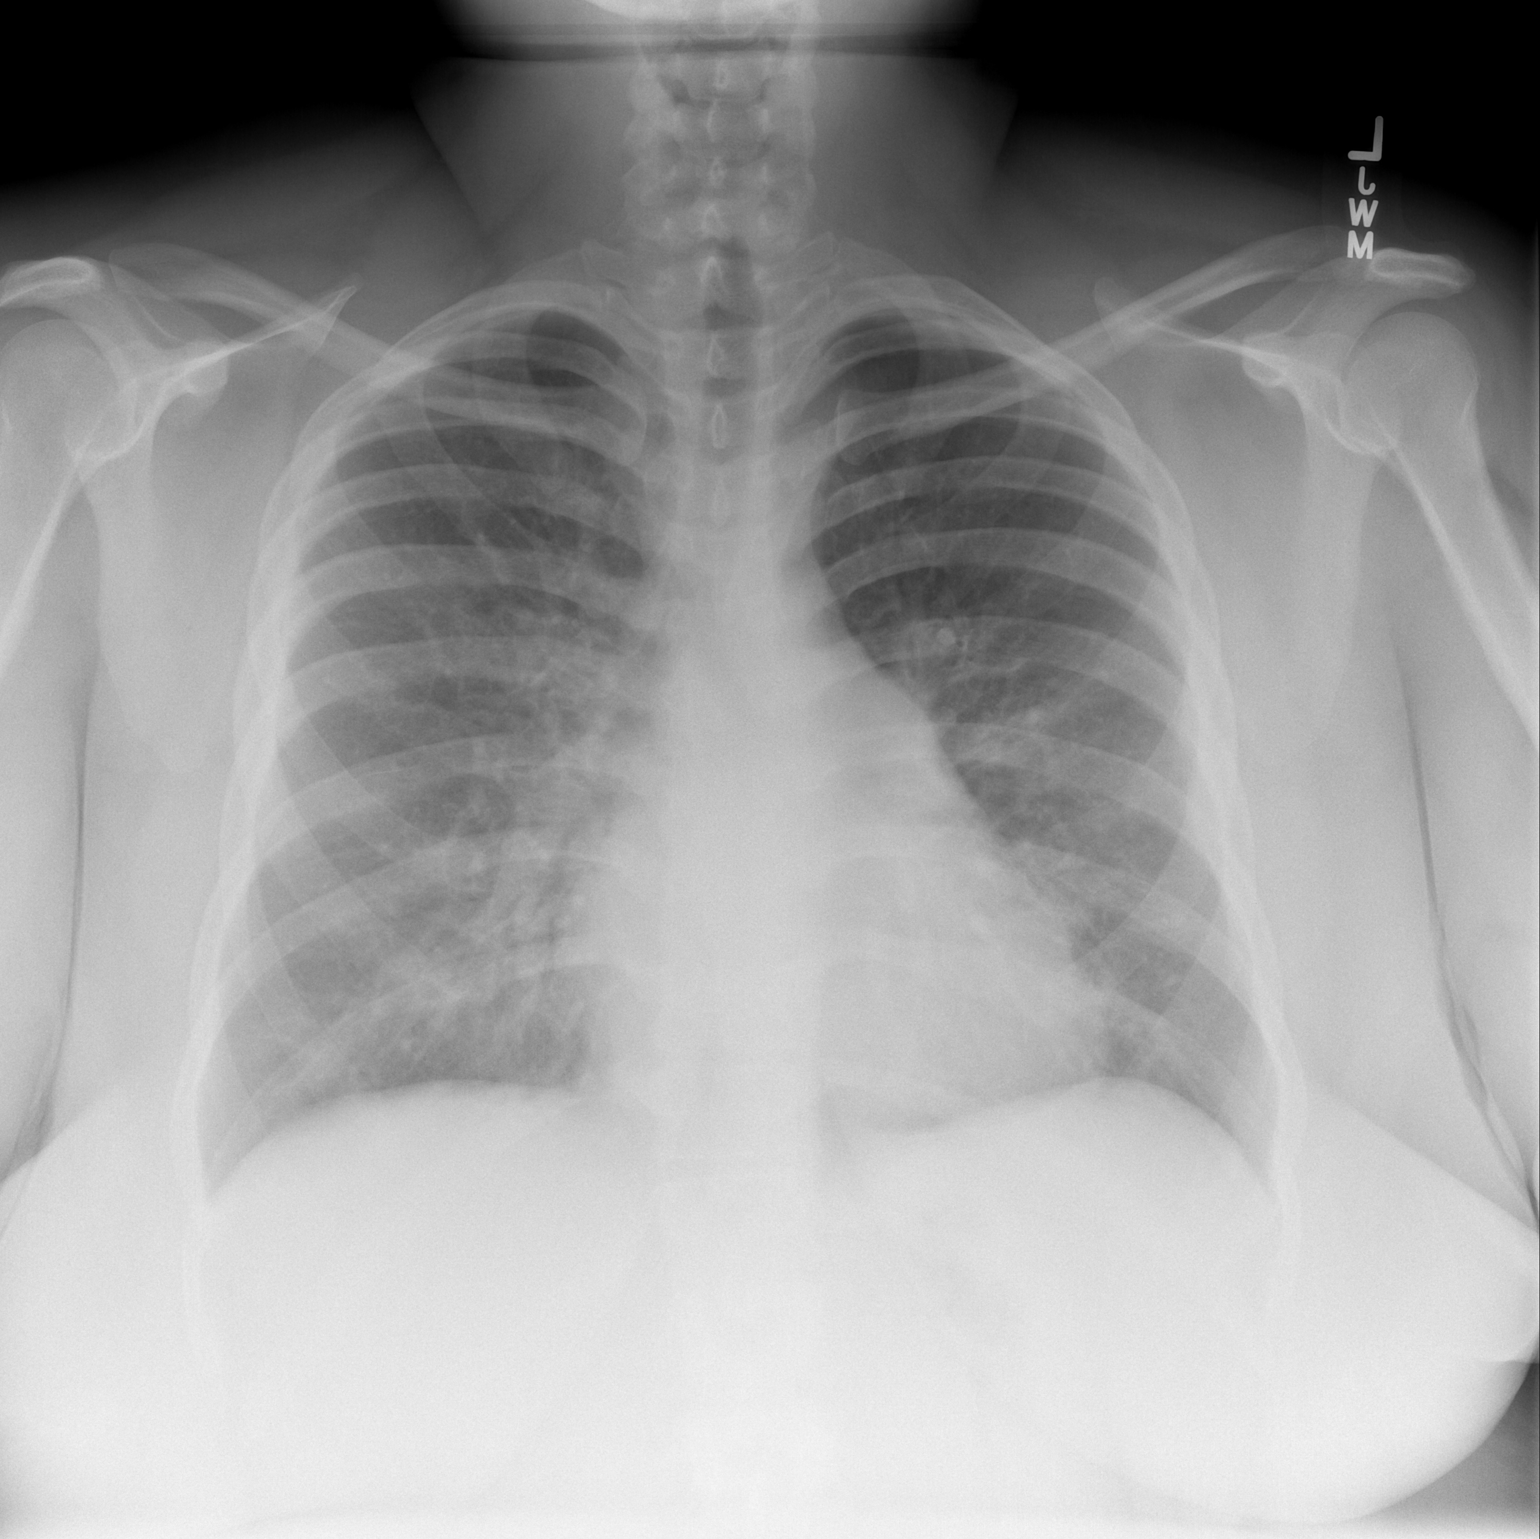

[w chest lat *]
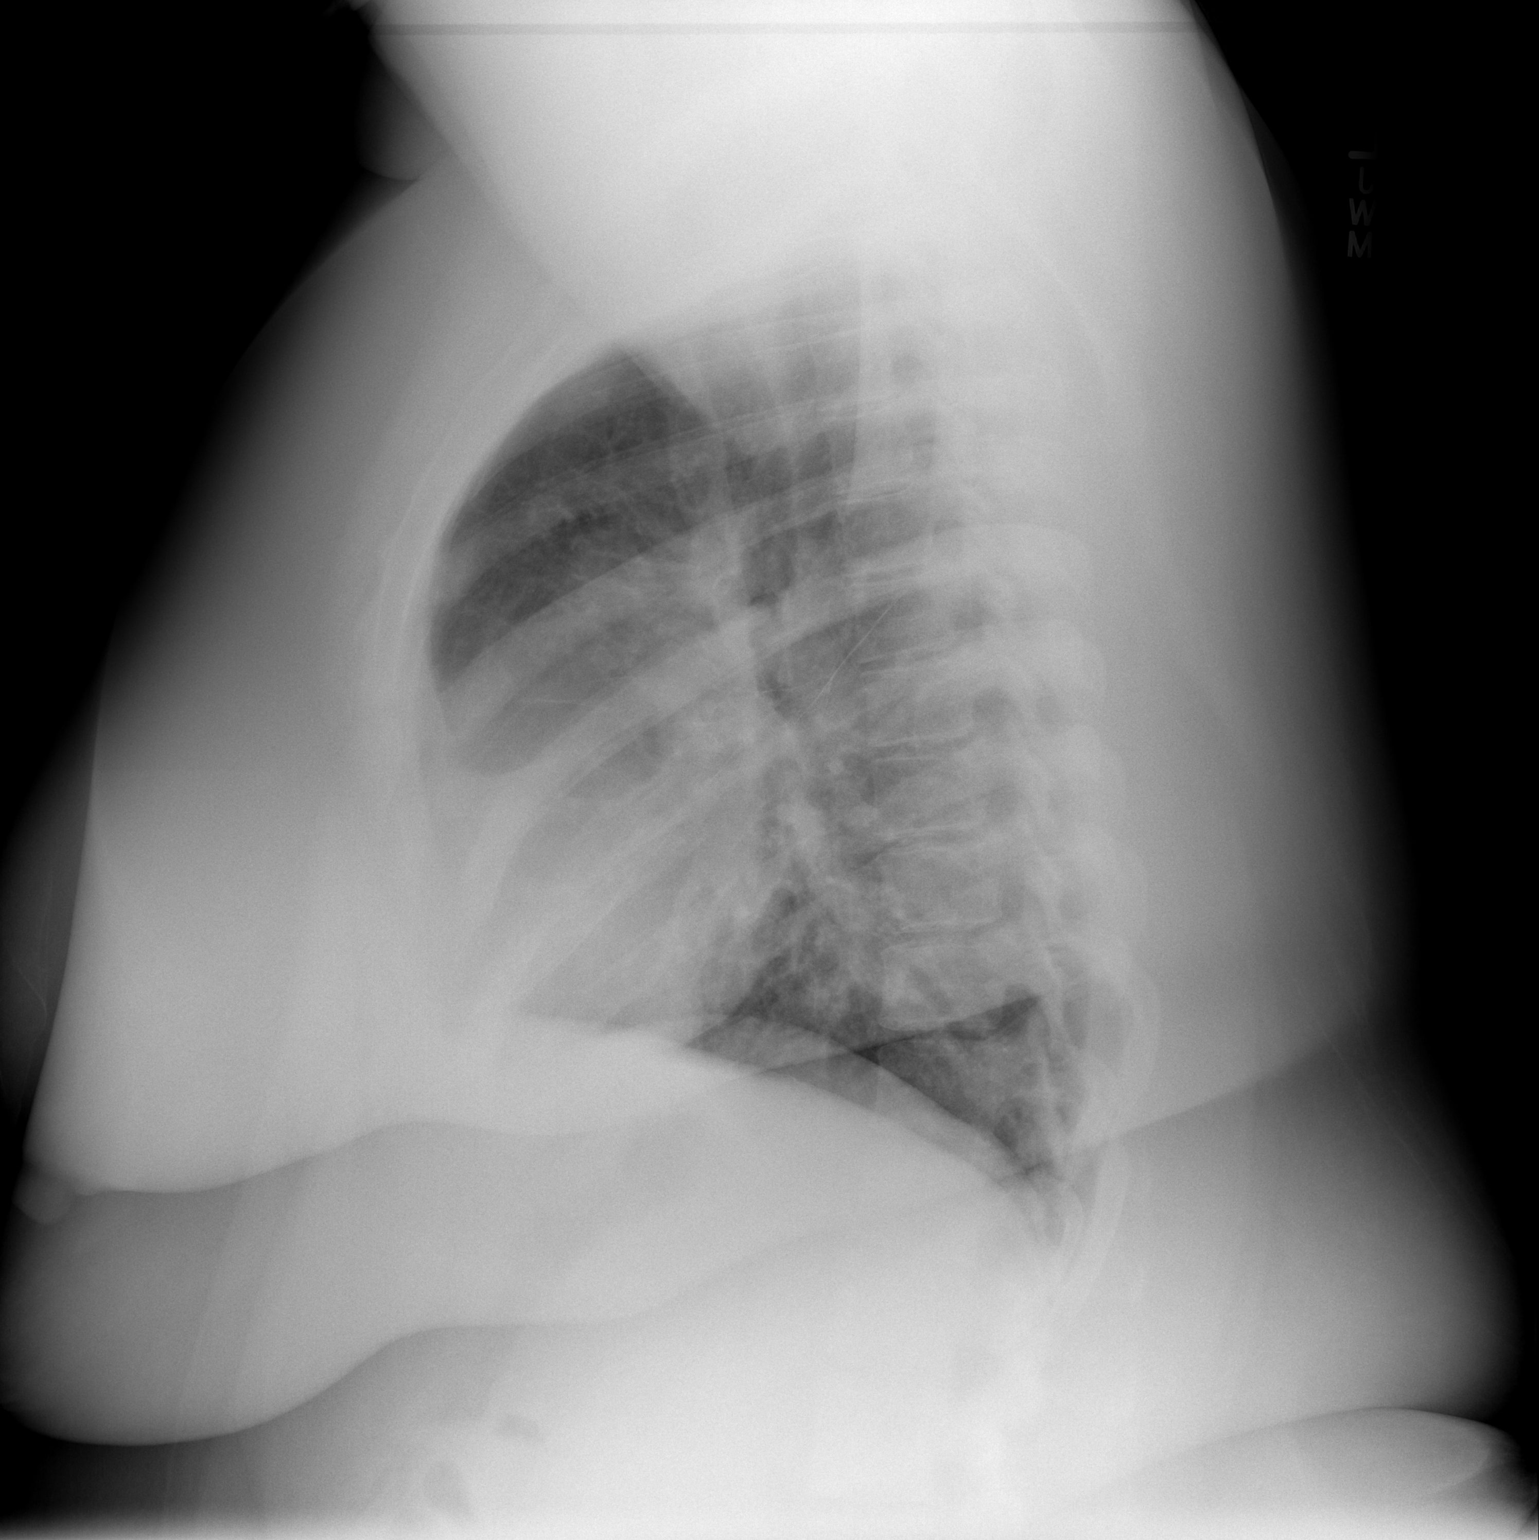

[2 of 2 positions shown; findings below may reference images not displayed]

FINDINGS: Upper normal heart size.
Normal mediastinal contours and pulmonary vascularity.
Peribronchial thickening.
Chronic accentuation of perihilar markings little change from
previous exam.
No segmental infiltrate or pleural effusion.
No pneumothorax.
Bones unremarkable.
IMPRESSION: Peribronchial thickening and chronic accentuation perihilar
markings question related to bronchitis or asthma, difficult to
exclude recurrent subtle perihilar infiltrates.
No segmental consolidation identified.

## 2014-01-24 ENCOUNTER — Encounter (HOSPITAL_BASED_OUTPATIENT_CLINIC_OR_DEPARTMENT_OTHER): Payer: Self-pay | Admitting: Emergency Medicine

## 2014-12-26 IMAGING — CT CT HEAD W/O CM
1 series · 16 of 30 positions shown, 20 images · non-contrast
Comparison: None.

CLINICAL DATA: Facial numbness.  Possible stroke symptoms.

EXAM:
CT HEAD WITHOUT CONTRAST
TECHNIQUE: Contiguous axial images were obtained from the base of the skull
through the vertex without intravenous contrast.

[Series 2: head 4.8 h37s · axial · 0.44mm/px · z∈[+1012,+1145]mm · 16 of 32 slices shown, 20 images]
[im 2/32  brain]
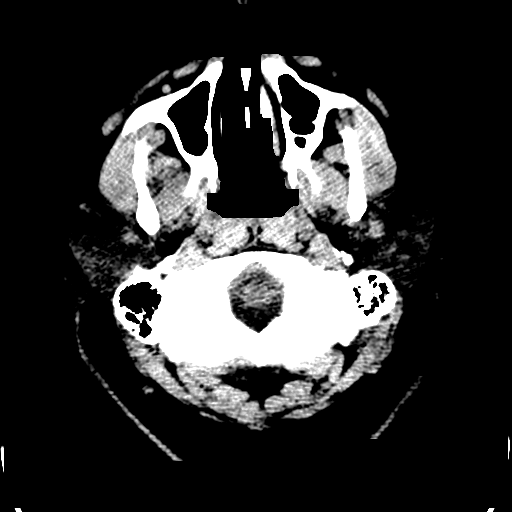
[im 2/32  bone]
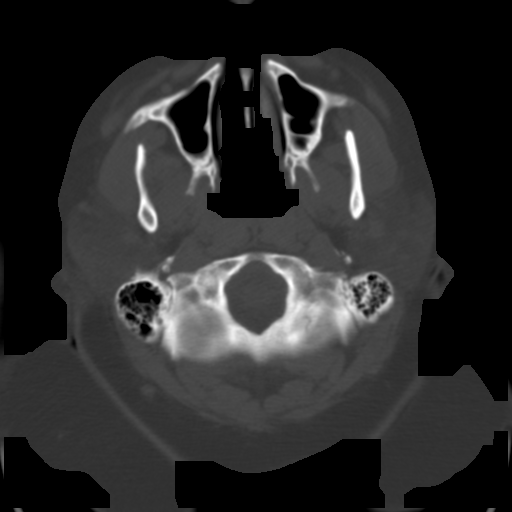
[im 4/32  brain]
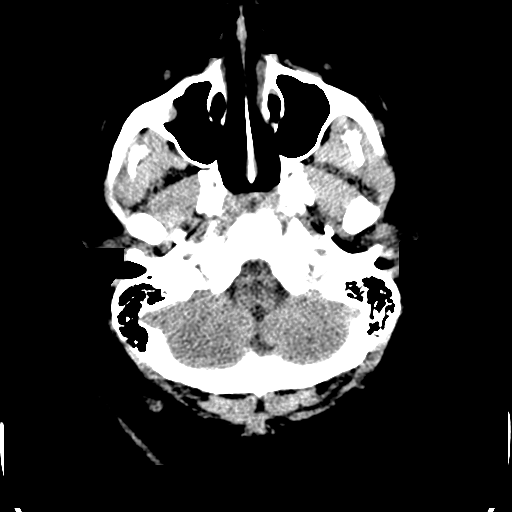
[im 6/32  brain]
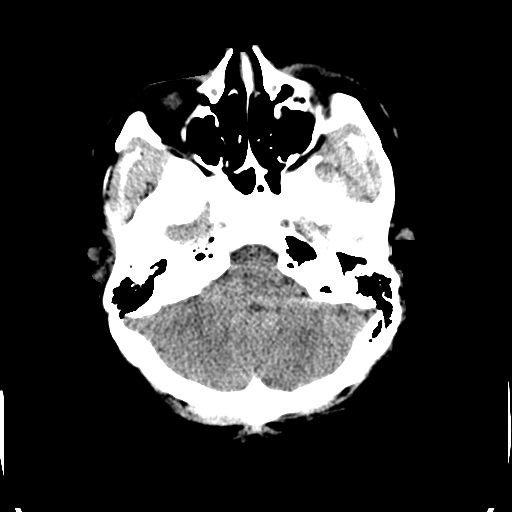
[im 8/32  brain]
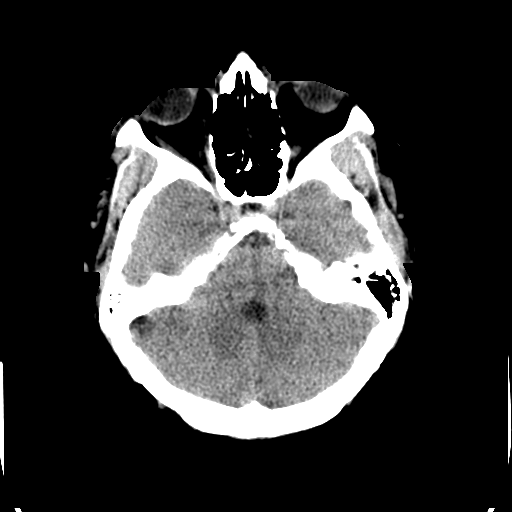
[im 9/32  brain]
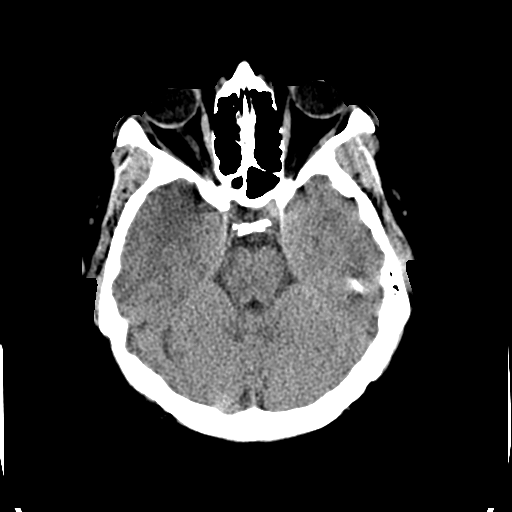
[im 9/32  bone]
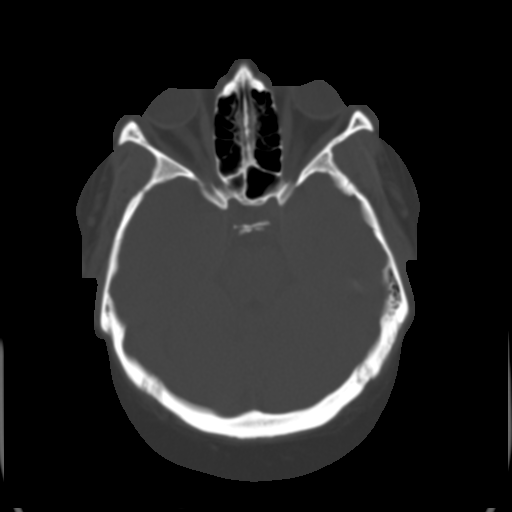
[im 11/32  brain]
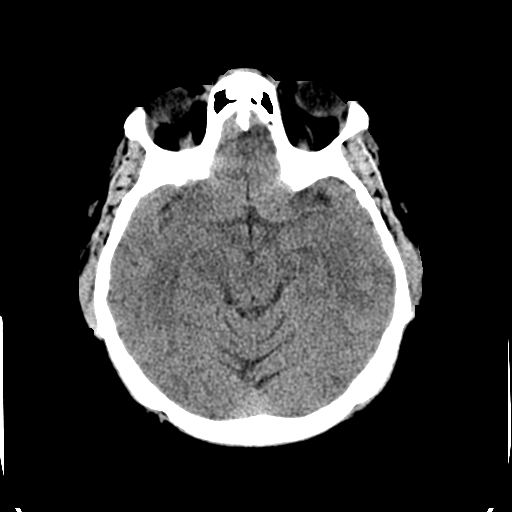
[im 13/32  brain]
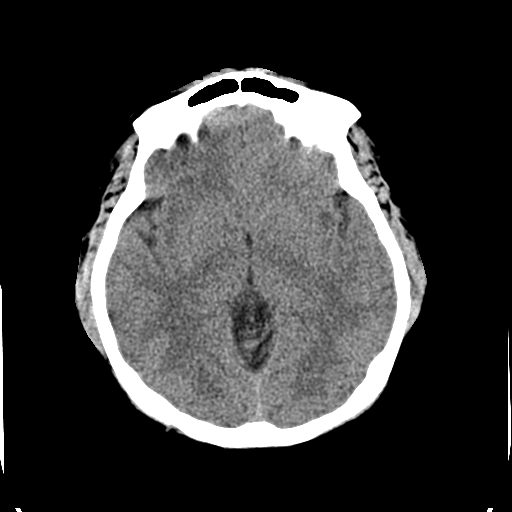
[im 15/32  brain]
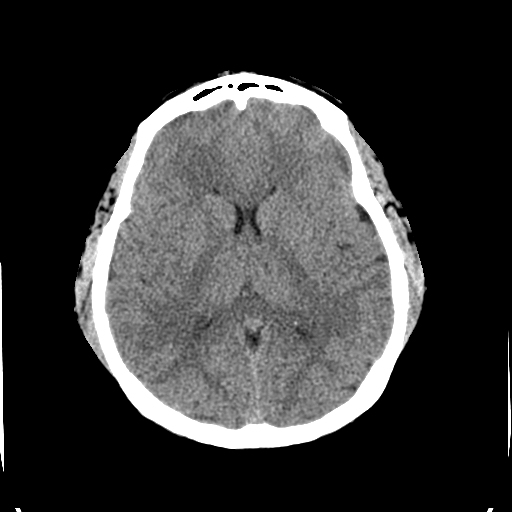
[im 17/32  brain]
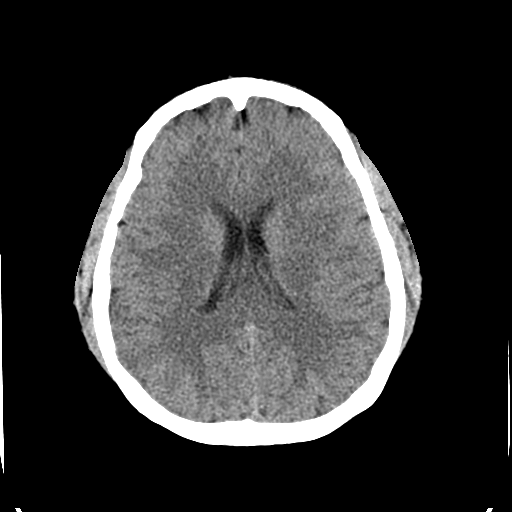
[im 17/32  bone]
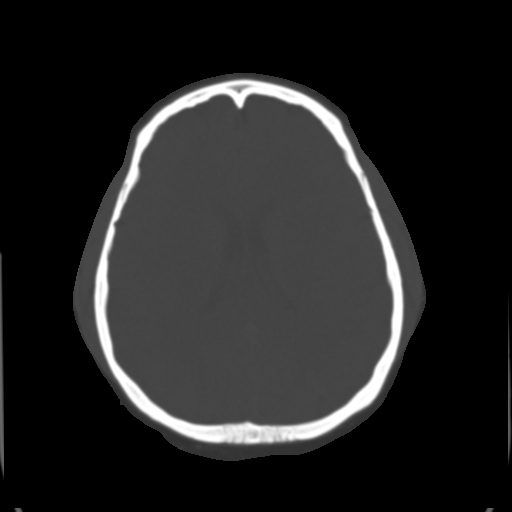
[im 19/32  brain]
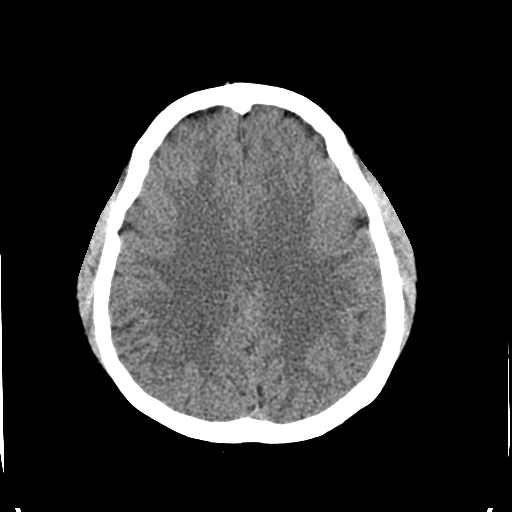
[im 21/32  brain]
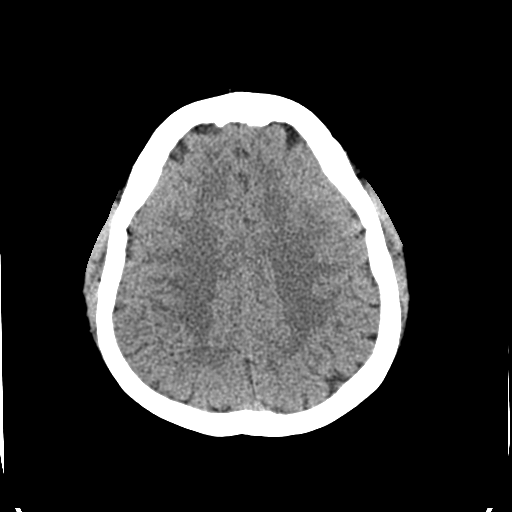
[im 23/32  brain]
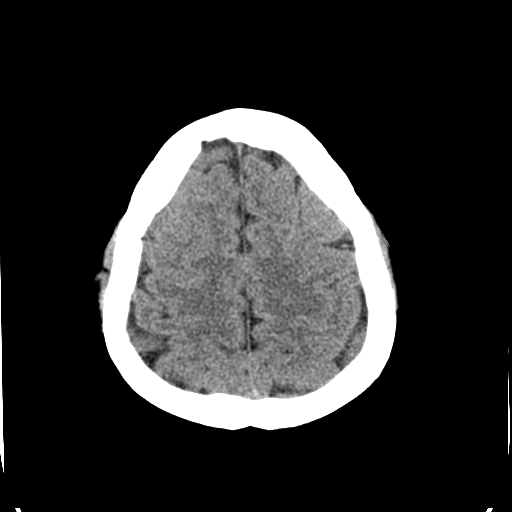
[im 24/32  brain]
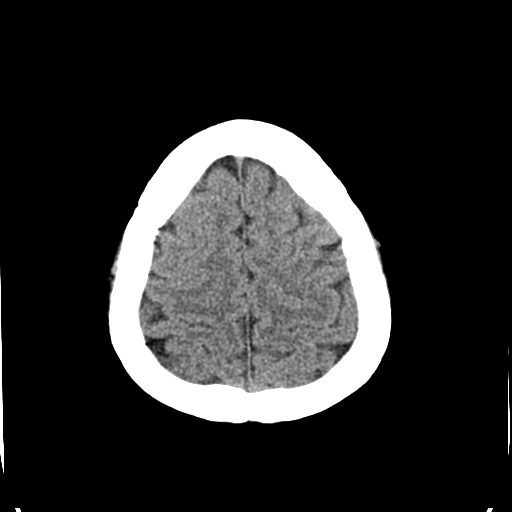
[im 24/32  bone]
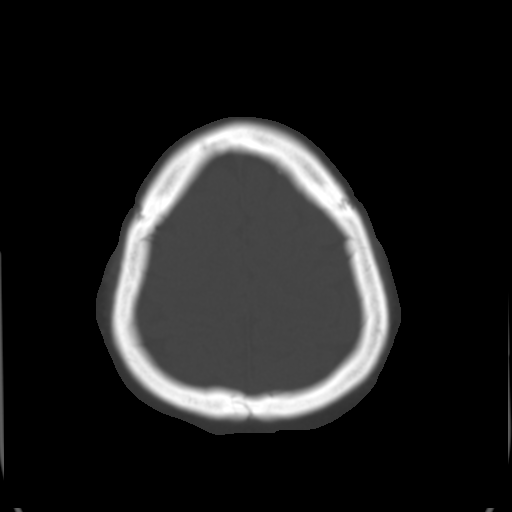
[im 26/32  brain]
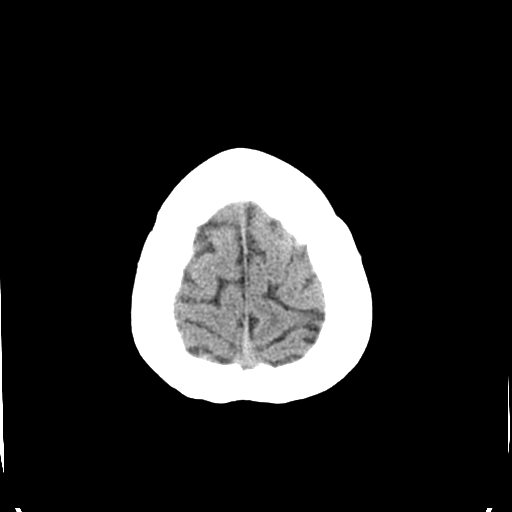
[im 28/32  brain]
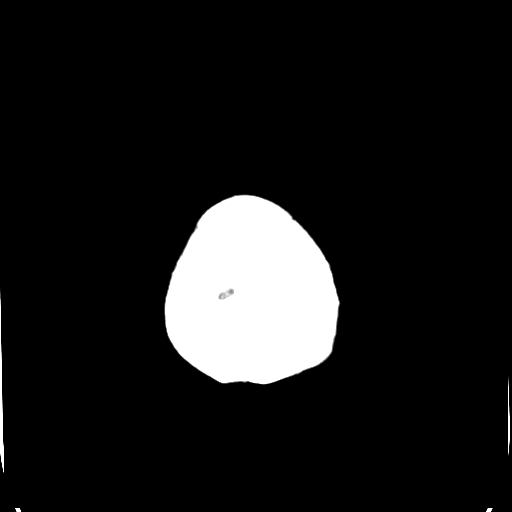
[im 30/32  brain]
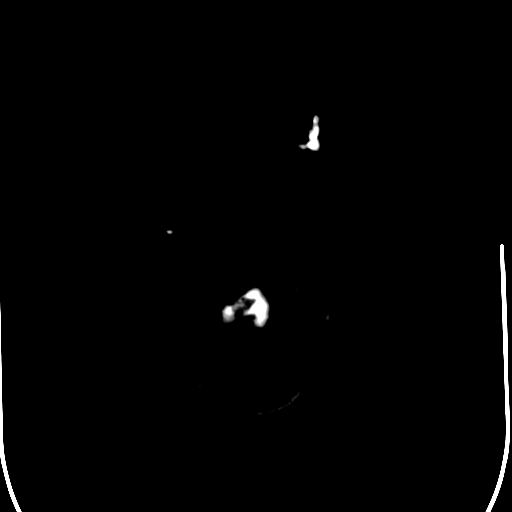

[16 of 30 positions shown; findings below may reference images not displayed]

FINDINGS: No evidence of intracranial hemorrhage, brain edema, or other signs
of acute infarction. No evidence of intracranial mass lesion or mass
effect. No abnormal extraaxial fluid collections identified.
Ventricles are normal in size. No skull abnormality identified.
IMPRESSION: Negative noncontrast head CT.

## 2016-09-05 ENCOUNTER — Emergency Department (HOSPITAL_BASED_OUTPATIENT_CLINIC_OR_DEPARTMENT_OTHER)
Admission: EM | Admit: 2016-09-05 | Discharge: 2016-09-06 | Disposition: A | Discharge status: Home / Self Care | Payer: BLUE CROSS/BLUE SHIELD | Attending: Emergency Medicine | Admitting: Emergency Medicine

## 2016-09-05 ENCOUNTER — Encounter (HOSPITAL_BASED_OUTPATIENT_CLINIC_OR_DEPARTMENT_OTHER): Payer: Self-pay | Admitting: Emergency Medicine

## 2016-09-05 ENCOUNTER — Emergency Department (HOSPITAL_BASED_OUTPATIENT_CLINIC_OR_DEPARTMENT_OTHER): Payer: BLUE CROSS/BLUE SHIELD

## 2016-09-05 DIAGNOSIS — S161XXA Strain of muscle, fascia and tendon at neck level, initial encounter: Secondary | ICD-10-CM | POA: Diagnosis not present

## 2016-09-05 DIAGNOSIS — F17210 Nicotine dependence, cigarettes, uncomplicated: Secondary | ICD-10-CM | POA: Insufficient documentation

## 2016-09-05 DIAGNOSIS — Y939 Activity, unspecified: Secondary | ICD-10-CM | POA: Diagnosis not present

## 2016-09-05 DIAGNOSIS — S39012A Strain of muscle, fascia and tendon of lower back, initial encounter: Secondary | ICD-10-CM | POA: Insufficient documentation

## 2016-09-05 DIAGNOSIS — J069 Acute upper respiratory infection, unspecified: Secondary | ICD-10-CM | POA: Diagnosis not present

## 2016-09-05 DIAGNOSIS — Y92410 Unspecified street and highway as the place of occurrence of the external cause: Secondary | ICD-10-CM | POA: Diagnosis not present

## 2016-09-05 DIAGNOSIS — Y999 Unspecified external cause status: Secondary | ICD-10-CM | POA: Diagnosis not present

## 2016-09-05 DIAGNOSIS — Z79899 Other long term (current) drug therapy: Secondary | ICD-10-CM | POA: Diagnosis not present

## 2016-09-05 DIAGNOSIS — S199XXA Unspecified injury of neck, initial encounter: Secondary | ICD-10-CM | POA: Diagnosis present

## 2016-09-05 DIAGNOSIS — J4521 Mild intermittent asthma with (acute) exacerbation: Secondary | ICD-10-CM | POA: Diagnosis not present

## 2016-09-05 MED ORDER — ALBUTEROL SULFATE (2.5 MG/3ML) 0.083% IN NEBU
2.5000 mg | INHALATION_SOLUTION | Freq: Once | RESPIRATORY_TRACT | Status: AC
  Administered 2016-09-06: 2.5 mg via RESPIRATORY_TRACT
  Filled 2016-09-05: qty 3

## 2016-09-05 MED ORDER — IPRATROPIUM-ALBUTEROL 0.5-2.5 (3) MG/3ML IN SOLN
3.0000 mL | Freq: Once | RESPIRATORY_TRACT | Status: AC
  Administered 2016-09-06: 3 mL via RESPIRATORY_TRACT
  Filled 2016-09-05: qty 3
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
## 2016-09-05 NOTE — ED Notes (Signed)
ED Provider at bedside. 

## 2016-09-06 ENCOUNTER — Emergency Department (HOSPITAL_BASED_OUTPATIENT_CLINIC_OR_DEPARTMENT_OTHER): Payer: BLUE CROSS/BLUE SHIELD

## 2016-09-06 MED ORDER — PREDNISONE 20 MG PO TABS: 40.0000 mg | ORAL_TABLET | Freq: Every day | ORAL | 0 refills | Status: DC

## 2016-09-06 MED ORDER — ALBUTEROL SULFATE (2.5 MG/3ML) 0.083% IN NEBU
INHALATION_SOLUTION | RESPIRATORY_TRACT | Status: AC
  Filled 2016-09-06: qty 3

## 2016-09-06 MED ORDER — ALBUTEROL SULFATE HFA 108 (90 BASE) MCG/ACT IN AERS: 2.0000 | INHALATION_SPRAY | RESPIRATORY_TRACT | 2 refills | Status: AC | PRN

## 2016-09-06 MED ORDER — TRAMADOL HCL 50 MG PO TABS: 50.0000 mg | ORAL_TABLET | Freq: Four times a day (QID) | ORAL | 0 refills | Status: DC | PRN

## 2016-09-06 MED ORDER — METHOCARBAMOL 500 MG PO TABS: 500.0000 mg | ORAL_TABLET | Freq: Two times a day (BID) | ORAL | 0 refills | Status: DC

## 2016-09-06 NOTE — ED Notes (Signed)
ED Provider at bedside to discuss discharge instructions 

## 2016-09-06 NOTE — ED Notes (Signed)
Pt refused urine preg

## 2019-08-04 ENCOUNTER — Encounter: Payer: BLUE CROSS/BLUE SHIELD | Admitting: Obstetrics & Gynecology

## 2019-08-19 ENCOUNTER — Encounter: Payer: BLUE CROSS/BLUE SHIELD | Admitting: Obstetrics & Gynecology

## 2019-08-19 DIAGNOSIS — N949 Unspecified condition associated with female genital organs and menstrual cycle: Secondary | ICD-10-CM

## 2021-03-21 ENCOUNTER — Inpatient Hospital Stay (HOSPITAL_BASED_OUTPATIENT_CLINIC_OR_DEPARTMENT_OTHER)
Admission: EM | Admit: 2021-03-21 | Discharge: 2021-03-23 | DRG: 760 | Disposition: A | Discharge status: Home / Self Care | Payer: No Typology Code available for payment source | Attending: Internal Medicine | Admitting: Internal Medicine

## 2021-03-21 ENCOUNTER — Emergency Department (HOSPITAL_BASED_OUTPATIENT_CLINIC_OR_DEPARTMENT_OTHER): Payer: No Typology Code available for payment source

## 2021-03-21 ENCOUNTER — Encounter (HOSPITAL_BASED_OUTPATIENT_CLINIC_OR_DEPARTMENT_OTHER): Payer: Self-pay

## 2021-03-21 ENCOUNTER — Other Ambulatory Visit: Payer: Self-pay

## 2021-03-21 DIAGNOSIS — N939 Abnormal uterine and vaginal bleeding, unspecified: Secondary | ICD-10-CM | POA: Diagnosis present

## 2021-03-21 DIAGNOSIS — Z9049 Acquired absence of other specified parts of digestive tract: Secondary | ICD-10-CM | POA: Diagnosis not present

## 2021-03-21 DIAGNOSIS — I509 Heart failure, unspecified: Secondary | ICD-10-CM

## 2021-03-21 DIAGNOSIS — Z9114 Patient's other noncompliance with medication regimen: Secondary | ICD-10-CM

## 2021-03-21 DIAGNOSIS — J9611 Chronic respiratory failure with hypoxia: Secondary | ICD-10-CM | POA: Diagnosis present

## 2021-03-21 DIAGNOSIS — Z79899 Other long term (current) drug therapy: Secondary | ICD-10-CM | POA: Diagnosis not present

## 2021-03-21 DIAGNOSIS — Z6841 Body Mass Index (BMI) 40.0 and over, adult: Secondary | ICD-10-CM

## 2021-03-21 DIAGNOSIS — I5032 Chronic diastolic (congestive) heart failure: Secondary | ICD-10-CM | POA: Diagnosis present

## 2021-03-21 DIAGNOSIS — D500 Iron deficiency anemia secondary to blood loss (chronic): Secondary | ICD-10-CM | POA: Diagnosis present

## 2021-03-21 DIAGNOSIS — F17210 Nicotine dependence, cigarettes, uncomplicated: Secondary | ICD-10-CM | POA: Diagnosis present

## 2021-03-21 DIAGNOSIS — Z20822 Contact with and (suspected) exposure to covid-19: Secondary | ICD-10-CM | POA: Diagnosis present

## 2021-03-21 DIAGNOSIS — R0902 Hypoxemia: Secondary | ICD-10-CM

## 2021-03-21 DIAGNOSIS — J45909 Unspecified asthma, uncomplicated: Secondary | ICD-10-CM | POA: Diagnosis present

## 2021-03-21 DIAGNOSIS — D259 Leiomyoma of uterus, unspecified: Secondary | ICD-10-CM | POA: Diagnosis present

## 2021-03-21 DIAGNOSIS — J9612 Chronic respiratory failure with hypercapnia: Secondary | ICD-10-CM

## 2021-03-21 DIAGNOSIS — E662 Morbid (severe) obesity with alveolar hypoventilation: Secondary | ICD-10-CM | POA: Diagnosis present

## 2021-03-21 DIAGNOSIS — G4733 Obstructive sleep apnea (adult) (pediatric): Secondary | ICD-10-CM

## 2021-03-21 DIAGNOSIS — Z7952 Long term (current) use of systemic steroids: Secondary | ICD-10-CM | POA: Diagnosis not present

## 2021-03-21 DIAGNOSIS — D649 Anemia, unspecified: Secondary | ICD-10-CM

## 2021-03-21 DIAGNOSIS — I5033 Acute on chronic diastolic (congestive) heart failure: Secondary | ICD-10-CM | POA: Insufficient documentation

## 2021-03-21 DIAGNOSIS — Z9851 Tubal ligation status: Secondary | ICD-10-CM | POA: Diagnosis not present

## 2021-03-21 HISTORY — DX: Heart failure, unspecified: I50.9

## 2021-03-21 LAB — COMPREHENSIVE METABOLIC PANEL
ALT: 13 U/L (ref 0–44)
AST: 12 U/L — ABNORMAL LOW (ref 15–41)
Albumin: 3 g/dL — ABNORMAL LOW (ref 3.5–5.0)
Alkaline Phosphatase: 48 U/L (ref 38–126)
Anion gap: 5 (ref 5–15)
BUN: 9 mg/dL (ref 6–20)
CO2: 33 mmol/L — ABNORMAL HIGH (ref 22–32)
Calcium: 8.3 mg/dL — ABNORMAL LOW (ref 8.9–10.3)
Chloride: 99 mmol/L (ref 98–111)
Creatinine, Ser: 0.88 mg/dL (ref 0.44–1.00)
GFR, Estimated: >60 mL/min (ref >60)
Glucose, Bld: 92 mg/dL (ref 70–99)
Potassium: 3.9 mmol/L (ref 3.5–5.1)
Sodium: 137 mmol/L (ref 135–145)
Total Bilirubin: 0.5 mg/dL (ref 0.3–1.2)
Total Protein: 7.6 g/dL (ref 6.5–8.1)

## 2021-03-21 LAB — CBC WITH DIFFERENTIAL/PLATELET
Abs Immature Granulocytes: 0.02 10*3/uL (ref 0.00–0.07)
Basophils Absolute: 0 10*3/uL (ref 0.0–0.1)
Basophils Relative: 0 %
Eosinophils Absolute: 0.1 10*3/uL (ref 0.0–0.5)
Eosinophils Relative: 2 %
HCT: 30.9 % — ABNORMAL LOW (ref 36.0–46.0)
Hemoglobin: 8.7 g/dL — ABNORMAL LOW (ref 12.0–15.0)
Immature Granulocytes: 0 %
Lymphocytes Relative: 17 %
Lymphs Abs: 1.3 10*3/uL (ref 0.7–4.0)
MCH: 24 pg — ABNORMAL LOW (ref 26.0–34.0)
MCHC: 28.2 g/dL — ABNORMAL LOW (ref 30.0–36.0)
MCV: 85.1 fL (ref 80.0–100.0)
Monocytes Absolute: 0.4 10*3/uL (ref 0.1–1.0)
Monocytes Relative: 5 %
Neutro Abs: 5.8 10*3/uL (ref 1.7–7.7)
Neutrophils Relative %: 76 %
Platelets: 324 10*3/uL (ref 150–400)
RBC: 3.63 MIL/uL — ABNORMAL LOW (ref 3.87–5.11)
RDW: 18.6 % — ABNORMAL HIGH (ref 11.5–15.5)
WBC: 7.7 10*3/uL (ref 4.0–10.5)
nRBC: 0.3 % — ABNORMAL HIGH (ref 0.0–0.2)

## 2021-03-21 LAB — TROPONIN I (HIGH SENSITIVITY): Troponin I (High Sensitivity): 4 ng/L (ref <18)

## 2021-03-21 LAB — RESP PANEL BY RT-PCR (FLU A&B, COVID) ARPGX2
Influenza A by PCR: NEGATIVE
Influenza B by PCR: NEGATIVE
SARS Coronavirus 2 by RT PCR: NEGATIVE

## 2021-03-21 LAB — PREGNANCY, URINE: Preg Test, Ur: NEGATIVE

## 2021-03-21 LAB — BRAIN NATRIURETIC PEPTIDE: B Natriuretic Peptide: 58.5 pg/mL (ref 0.0–100.0)

## 2021-03-21 MED ORDER — FUROSEMIDE 40 MG PO TABS
80.0000 mg | ORAL_TABLET | Freq: Every day | ORAL | Status: DC
  Administered 2021-03-22 – 2021-03-23 (×2): 80 mg via ORAL
  Filled 2021-03-21 (×2): qty 2

## 2021-03-21 MED ORDER — LORAZEPAM 0.5 MG PO TABS
0.5000 mg | ORAL_TABLET | Freq: Once | ORAL | Status: AC | PRN
  Administered 2021-03-22: 02:00:00 0.5 mg via ORAL
  Filled 2021-03-21: qty 1

## 2021-03-21 MED ORDER — SODIUM CHLORIDE 0.9 % IV SOLN: 250.0000 mL | INTRAVENOUS | Status: DC | PRN

## 2021-03-21 MED ORDER — ACETAMINOPHEN 325 MG PO TABS
650.0000 mg | ORAL_TABLET | ORAL | Status: DC | PRN
  Administered 2021-03-22: 19:00:00 650 mg via ORAL
  Filled 2021-03-21: qty 2

## 2021-03-21 MED ORDER — ALBUTEROL SULFATE (2.5 MG/3ML) 0.083% IN NEBU: 2.5000 mg | INHALATION_SOLUTION | RESPIRATORY_TRACT | Status: DC | PRN

## 2021-03-21 MED ORDER — ONDANSETRON HCL 4 MG/2ML IJ SOLN: 4.0000 mg | Freq: Four times a day (QID) | INTRAMUSCULAR | Status: DC | PRN

## 2021-03-21 MED ORDER — SODIUM CHLORIDE 0.9% FLUSH: 3.0000 mL | INTRAVENOUS | Status: DC | PRN

## 2021-03-21 MED ORDER — SODIUM CHLORIDE 0.9% FLUSH
3.0000 mL | Freq: Two times a day (BID) | INTRAVENOUS | Status: DC
  Administered 2021-03-22 (×2): 3 mL via INTRAVENOUS

## 2021-03-21 MED ORDER — FUROSEMIDE 10 MG/ML IJ SOLN
80.0000 mg | Freq: Once | INTRAMUSCULAR | Status: AC
  Administered 2021-03-21: 16:00:00 80 mg via INTRAVENOUS
  Filled 2021-03-21: qty 8

## 2021-03-21 MED ORDER — FUROSEMIDE 10 MG/ML IJ SOLN: 60.0000 mg | Freq: Two times a day (BID) | INTRAMUSCULAR | Status: DC

## 2021-03-21 MED ORDER — LORAZEPAM 2 MG/ML IJ SOLN
0.5000 mg | Freq: Once | INTRAMUSCULAR | Status: AC
  Administered 2021-03-21: 20:00:00 0.5 mg via INTRAVENOUS
  Filled 2021-03-21: qty 1

## 2021-03-21 NOTE — ED Notes (Signed)
SpO2 85% on r/a Placed on 2lpm [place] in lobby.

## 2021-03-21 NOTE — ED Notes (Signed)
Report given to [employer]

## 2021-03-21 NOTE — ED Triage Notes (Signed)
Pt c/o vaginal bleeding since Oct-states she was seen at [facility] ED-was started on progesterone-bleeding stopped-bleeding restarted after stopped taking med-when med restarted vaginal bleeding did not stop-to triage in w/c

## 2021-03-21 NOTE — ED Notes (Signed)
[facility] arrived to transport pt 

## 2021-03-21 NOTE — ED Notes (Signed)
ED Provider at bedside attempting [place] IV.

## 2021-03-21 NOTE — ED Provider Notes (Signed)
I saw the patient in conjunction with the PA.  35 year old female with past medical history of CHF, intermittent heavy vaginal bleeding.  Currently on progesterone only medication.  Complaining of heavy vaginal bleeding.  Short of breath, hypoxic.  Findings of CHF on work-up, anemic with symptomatic anemia.  Ultrasound-guided IV placed.  Plan for admission for CHF and anemia.  Stable at time of transfer.      Ultrasound ED Peripheral IV (Provider)  Date/Time: 03/21/2021 3:51 PM Performed by: Lorelle Gibbs, DO Authorized by: Lorelle Gibbs, DO   Procedure details:    Indications: multiple failed IV attempts and poor IV access     Skin Prep: chlorhexidine gluconate     Location:  Left AC   Angiocath:  20 G   Bedside Ultrasound Guided: Yes     Images: not archived     Patient tolerated procedure without complications: Yes     Dressing applied: Yes      Lorelle Gibbs, DO 03/21/21 2335

## 2021-03-21 NOTE — ED Notes (Signed)
Talked to [facility] at [place] for hospitalitis consult

## 2021-03-21 NOTE — ED Notes (Signed)
RT placed pt on O2 in ED WR-waiting for bed for pt

## 2021-03-21 NOTE — ED Provider Notes (Signed)
[facility] EMERGENCY DEPARTMENT Provider Note   CSN: [id number] Arrival date & time: 03/21/21  1340     History Chief Complaint  Patient presents with   Vaginal Bleeding    Renee Paul is a 35 y.o. female.  35 year old female with past medical history of CHF presents with complaint of shortness of breath and vaginal bleeding.  Patient states that she has had heavy vaginal bleeding onset 1 year ago, resolved when she was started on medroxyprogesterone and bleeding resolved until she ran out of her medication and mid November.  Patient went to an urgent care at that time and her medication was refilled which she has been taking however states that she continues to have bleeding every day.  Patient came to the emergency room today because she has pain when she is passing clots.  Patient has been wearing diapers, states that she has gone through a box of 20 diapers in the past week, changing when she goes to the bathroom, not bleeding through.  Patient was seen by gynecology with [facility] in the past, told she would need ablation however due to her work schedule she has been unable to follow-up and is not scheduled to see gynecology until February.  Also reports shortness of breath for the past 2 to 3 weeks, worse with exertion, does have some chest discomfort with exertion with her shortness of breath.  Patient is prescribed furosemide however is intermittently compliant as this causes her to void more frequently and she prefers not to.      Past Medical History:  Diagnosis Date   Asthma    CHF (congestive heart failure) ([place])    Morbid obesity ([place])     Patient Active Problem List   Diagnosis Date Noted   CHF (congestive heart failure) ([place]) 03/21/2021    Past Surgical History:  Procedure Laterality Date   CESAREAN SECTION     CESAREAN SECTION     CHOLECYSTECTOMY     INTRAUTERINE DEVICE INSERTION     IUD REMOVAL     TUBAL LIGATION       OB History      Gravida  3   Para  2   Term  2   Preterm  0   AB  0   Living  2      SAB  0   IAB  0   Ectopic  0   Multiple  0   Live Births              No family history on file.  Social History   Tobacco Use   Smoking status: Every Day    Packs/day: 0.25    Types: Cigarettes   Smokeless tobacco: Never  Vaping Use   Vaping Use: Never used  Substance Use Topics   Alcohol use: Yes    Alcohol/week: 1.0 standard drink    Types: 1 Cans of beer per week    Comment: occ   Drug use: No    Home Medications Prior to Admission medications   Medication Sig Start Date End Date Taking? Authorizing Provider  albuterol (PROVENTIL HFA;VENTOLIN HFA) 108 (90 BASE) MCG/ACT inhaler Inhale 2 puffs into the lungs every 4 (four) hours as needed for wheezing. 09/21/12   Piepenbrink, Anderson Malta, PA-C  albuterol (PROVENTIL HFA;VENTOLIN HFA) 108 (90 Base) MCG/ACT inhaler Inhale 2 puffs into the lungs every 4 (four) hours as needed for wheezing or shortness of breath. 09/06/16   Orpah Greek, MD  amoxicillin (  AMOXIL) 500 MG capsule Take 1 capsule (500 mg total) by mouth 3 (three) times daily. 09/22/13   Tanna Furry, MD  clindamycin (CLEOCIN) 150 MG capsule Take 1 capsule (150 mg total) by mouth every 6 (six) hours. 04/29/13   [name] Murrain, NP  methocarbamol (ROBAXIN) 500 MG tablet Take 1 tablet (500 mg total) by mouth 2 (two) times daily. 09/06/16   Orpah Greek, MD  ondansetron (ZOFRAN) 8 MG tablet  09/04/11   [provider]  predniSONE (DELTASONE) 20 MG tablet Take 2 tablets (40 mg total) by mouth daily with breakfast. 09/06/16   Pollina, Gwenyth Allegra, MD  Prenatal Vit-Fe Fumarate-FA (PRENATAL MULTIVITAMIN) TABS Take 1 tablet by mouth daily.    [provider]  ranitidine (ZANTAC) 300 MG tablet Take 300 mg by mouth.    [provider]  sodium chloride (OCEAN NASAL SPRAY) 0.65 % nasal spray Place 1 spray into the nose as needed for congestion. 09/21/12    Piepenbrink, Anderson Malta, PA-C  traMADol (ULTRAM) 50 MG tablet Take 1 tablet (50 mg total) by mouth every 6 (six) hours as needed. 09/06/16   Orpah Greek, MD    Allergies    Patient has no known allergies.  Review of Systems   Review of Systems  Constitutional:  Negative for chills and fever.  Respiratory:  Positive for shortness of breath.   Cardiovascular:  Positive for chest pain.  Gastrointestinal:  Negative for abdominal pain, constipation, diarrhea, nausea and vomiting.  Genitourinary:  Positive for pelvic pain and vaginal bleeding. Negative for dysuria.  Musculoskeletal:  Negative for arthralgias and myalgias.  Skin:  Negative for pallor, rash and wound.  Allergic/Immunologic: Negative for immunocompromised state.  Neurological:  Negative for weakness.  Hematological:  Negative for adenopathy.  Psychiatric/Behavioral:  Negative for confusion.   All other systems reviewed and are negative.  Physical Exam Updated Vital Signs BP (!) 150/84    Pulse 90    Temp 98 F (36.7 C) (Oral)    Resp 18    SpO2 96%   Physical Exam Vitals and nursing note reviewed.  Constitutional:      General: She is not in acute distress.    Appearance: She is well-developed. She is obese. She is not diaphoretic.  HENT:     Head: Normocephalic and atraumatic.     Mouth/Throat:     Mouth: Mucous membranes are moist.  Eyes:     Conjunctiva/sclera: Conjunctivae normal.  Cardiovascular:     Rate and Rhythm: Normal rate and regular rhythm.     Heart sounds: Normal heart sounds.  Pulmonary:     Effort: Pulmonary effort is normal.     Breath sounds: No rales.  Abdominal:     Palpations: Abdomen is soft.     Tenderness: There is no abdominal tenderness.  Musculoskeletal:     Right lower leg: No edema.     Left lower leg: No edema.  Skin:    General: Skin is warm and dry.     Findings: No erythema or rash.  Neurological:     Mental Status: She is alert and oriented to person, place, and  time.  Psychiatric:        Behavior: Behavior normal.    ED Results / Procedures / Treatments   Labs (all labs ordered are listed, but only abnormal results are displayed) Labs Reviewed  COMPREHENSIVE METABOLIC PANEL - Abnormal; Notable for the following components:      Result Value  CO2 33 (*)    Calcium 8.3 (*)    Albumin 3.0 (*)    AST 12 (*)    All other components within normal limits  CBC WITH DIFFERENTIAL/PLATELET - Abnormal; Notable for the following components:   RBC 3.63 (*)    Hemoglobin 8.7 (*)    HCT 30.9 (*)    MCH 24.0 (*)    MCHC 28.2 (*)    RDW 18.6 (*)    nRBC 0.3 (*)    All other components within normal limits  RESP PANEL BY RT-PCR (FLU A&B, COVID) ARPGX2  PREGNANCY, URINE  BRAIN NATRIURETIC PEPTIDE  TROPONIN I (HIGH SENSITIVITY)    EKG None  Radiology DG Chest Port 1 View  Result Date: 03/21/2021 CLINICAL DATA:  Shortness of breath EXAM: PORTABLE CHEST 1 VIEW COMPARISON:  Chest x-ray 09/05/2016 FINDINGS: Somewhat limited evaluation due to body habitus and portable technique. Heart is enlarged. Mediastinum appears within normal limits. Pulmonary vasculature appears prominent with no focal consolidation visualized. No pleural effusion or pneumothorax identified. IMPRESSION: Cardiomegaly with possible pulmonary vascular prominence. Limited study. Electronically Signed   By: Ofilia Neas M.D.   On: 03/21/2021 15:55    Procedures .Critical Care Performed by: Tacy Learn, PA-C Authorized by: Tacy Learn, PA-C   Critical care provider statement:    Critical care time (minutes):  30   Critical care was time spent personally by me on the following activities:  Development of treatment plan with patient or surrogate, discussions with consultants, evaluation of patient's response to treatment, examination of patient, ordering and review of laboratory studies, ordering and review of radiographic studies, ordering and performing treatments and  interventions, pulse oximetry, re-evaluation of patient's condition and review of old charts   Medications Ordered in ED Medications  furosemide (LASIX) injection 80 mg (80 mg Intravenous Given 03/21/21 1556)    ED Course  I have reviewed the triage vital signs and the nursing notes.  Pertinent labs & imaging results that were available during my care of the patient were reviewed by me and considered in my medical decision making (see chart for details).  Clinical Course as of 03/21/21 1829  Wed Mar 21, 2562  413 35 year old female presents with complaint of vaginal bleeding with pain from passing clots as well as shortness of breath.  Patient has CHF, is noncompliant with her furosemide.  Arrives to the emergency room with an O2 sat of 85% which improves to 97% on 2 L nasal cannula.  Patient is on 2 to 3 L nasal cannula at bedtime, not on oxygen during the day.  Oxygen was discontinued and will obtain her history she does drop her O2 sats back to 85%, returns to normal with return of 2 L nasal cannula.  Patient with coarse lung sounds in the bases, exam limited by habitus, no evident pitting edema to the lower extremities. Review of labs, hemoglobin today is 8.7, compared to her prior labs on file in [place] on February 11, 2021 with hemoglobin of 10.2 and hematocrit of 36.5. Consider symptomatic anemia versus CHF exacerbation. Patient is given IV Lasix.  We will plan to admit due to new oxygen requirement and may need transfusion. [LM]  3748 Chest x-ray shows cardiomegaly with possible pulmonary vascular prominence as a limited study. BNP is 58.5.  Pending troponin due to complaint of chest pain. [LM]  1807 Troponin unremarkable at 4, given chest pain with her shortness of breath for the past 2 to 3 weeks,  do not feel delta is necessary.  hCG is negative.  Plan is to admit to hospital service at [facility] where patient will have access to cardiology and gynecology if  needed.  Discussed results and plan of care with patient who is hesitant to stay due to her upcoming daughter's birthday party at the [place], however is agreeable.  Patient currently eating crackers and dip.  Discussed recommendation for low-sodium intake.  COVID and flu negative.  Case discussed with Dr. Roosevelt Locks with [facility] service who will consult for admission.  [LM]    Clinical Course User Index [LM] Roque Lias   MDM Rules/Calculators/A&P                            Final Clinical Impression(s) / ED Diagnoses Final diagnoses:  Acute on chronic congestive heart failure, unspecified heart failure type [facility])  Vaginal bleeding  Symptomatic anemia  Hypoxia    Rx / DC Orders ED Discharge Orders     None        Tacy Learn, PA-C 03/21/21 1829    Horton, Alvin Critchley, DO 03/22/21 1532

## 2021-03-21 NOTE — H&P (Signed)
History and Physical    Naylee Frankowski ZLD:[id number] DOB: 08-04-1985 DOA: 03/21/2021  PCP[facility]   Patient coming from: Home   Chief Complaint: Vaginal bleeding   HPI: Io Dieujuste is a pleasant 35 y.o. female with medical history significant for BMI 84, abnormal uterine bleeding, anemia, OSA with CPAP intolerance, and HFpEF who presents the emergency department for evaluation of persistent vaginal bleeding.  Patient reports that she has had daily vaginal bleeding for several months, improved initially when she was started on progesterone in October, but has had recurrent daily bleeding despite this.  She reports passing dark blood and clots multiple times daily.  She reports being unable to tolerate CPAP.  She reports not taking furosemide as prescribed due to not wanting to urinate frequently.  She takes furosemide when she sees that her legs are beginning to swell but has not had any recent leg swelling.  She reports a history of asthma that has been well controlled.  [facility] ED Course: Upon arrival to the ED, patient is found to be afebrile, saturating 95 200% on 2 L/min of supplemental oxygen, and with stable blood pressure.  EKG features sinus rhythm.  Chest x-ray with cardiomegaly and question of pulmonary vascular prominence.  Chemistry panel notable for bicarbonate of 33.  CBC with hemoglobin 8.7.  Troponin and BNP are normal.  Patient was given 80 mg IV Lasix and IV Ativan in the ED.  She was transferred to [facility] for admission.  Review of Systems:  All other systems reviewed and apart from HPI, are negative.  Past Medical History:  Diagnosis Date   Asthma    CHF (congestive heart failure) ([place])    Morbid obesity ([place])     Past Surgical History:  Procedure Laterality Date   CESAREAN SECTION     CESAREAN SECTION     CHOLECYSTECTOMY     INTRAUTERINE DEVICE INSERTION     IUD REMOVAL     TUBAL LIGATION      Social History:   reports that  she has been smoking cigarettes. She has been smoking an average of .25 packs per day. She has never used smokeless tobacco. She reports current alcohol use of about 1.0 standard drink per week. She reports that she does not use drugs.  No Known Allergies  History reviewed. No pertinent family history.   Prior to Admission medications   Medication Sig Start Date End Date Taking? Authorizing Provider  albuterol (PROVENTIL HFA;VENTOLIN HFA) 108 (90 BASE) MCG/ACT inhaler Inhale 2 puffs into the lungs every 4 (four) hours as needed for wheezing. 09/21/12   Piepenbrink, Anderson Malta, PA-C  albuterol (PROVENTIL HFA;VENTOLIN HFA) 108 (90 Base) MCG/ACT inhaler Inhale 2 puffs into the lungs every 4 (four) hours as needed for wheezing or shortness of breath. 09/06/16   Orpah Greek, MD  amoxicillin (AMOXIL) 500 MG capsule Take 1 capsule (500 mg total) by mouth 3 (three) times daily. 09/22/13   Tanna Furry, MD  clindamycin (CLEOCIN) 150 MG capsule Take 1 capsule (150 mg total) by mouth every 6 (six) hours. 04/29/13   [name] Murrain, NP  methocarbamol (ROBAXIN) 500 MG tablet Take 1 tablet (500 mg total) by mouth 2 (two) times daily. 09/06/16   Orpah Greek, MD  ondansetron (ZOFRAN) 8 MG tablet  09/04/11   [provider]  predniSONE (DELTASONE) 20 MG tablet Take 2 tablets (40 mg total) by mouth daily with breakfast. 09/06/16   Pollina, Gwenyth Allegra, MD  Prenatal Vit-Fe Fumarate-FA (PRENATAL MULTIVITAMIN) TABS Take 1 tablet by mouth daily.    [provider]  ranitidine (ZANTAC) 300 MG tablet Take 300 mg by mouth.    [provider]  sodium chloride (OCEAN NASAL SPRAY) 0.65 % nasal spray Place 1 spray into the nose as needed for congestion. 09/21/12   Piepenbrink, Anderson Malta, PA-C  traMADol (ULTRAM) 50 MG tablet Take 1 tablet (50 mg total) by mouth every 6 (six) hours as needed. 09/06/16   Orpah Greek, MD    Physical Exam: Vitals:   03/21/21 1800 03/21/21 2030  03/21/21 2234 03/21/21 2236  BP: 131/72 (!) 111/48  127/71  Pulse: 87 95  93  Resp: 18 18  18   Temp:   98.8 F (37.1 C) 98.8 F (37.1 C)  TempSrc:   Oral Oral  SpO2: 96% 95%  100%  Weight:    (!) 214 kg    Constitutional: NAD, calm  Eyes: PERTLA, lids and conjunctivae normal ENMT: Mucous membranes are moist. Posterior pharynx clear of any exudate or lesions.   Neck: supple, no masses  Respiratory: no wheezing, no crackles. No accessory muscle use.  Cardiovascular: S1 & S2 heard, regular rate and rhythm. No extremity edema.   Abdomen: No distension, no tenderness, soft. Bowel sounds active.  Musculoskeletal: no clubbing / cyanosis. No joint deformity upper and lower extremities.   Skin: no significant rashes, lesions, ulcers. Warm, dry, well-perfused. Neurologic: CN 2-12 grossly intact. Moving all extremities. Alert and oriented.  Psychiatric: Pleasant. Cooperative.    Labs and Imaging on Admission: I have personally reviewed following labs and imaging studies  CBC: Recent Labs  Lab 03/21/21 1541  WBC 7.7  NEUTROABS 5.8  HGB 8.7*  HCT 30.9*  MCV 85.1  PLT 992   Basic Metabolic Panel: Recent Labs  Lab 03/21/21 1541  NA 137  K 3.9  CL 99  CO2 33*  GLUCOSE 92  BUN 9  CREATININE 0.88  CALCIUM 8.3*   GFR: CrCl cannot be calculated (Unknown ideal weight.). Liver Function Tests: Recent Labs  Lab 03/21/21 1541  AST 12*  ALT 13  ALKPHOS 48  BILITOT 0.5  PROT 7.6  ALBUMIN 3.0*   No results for input(s): LIPASE, AMYLASE in the last 168 hours. No results for input(s): AMMONIA in the last 168 hours. Coagulation Profile: No results for input(s): INR, PROTIME in the last 168 hours. Cardiac Enzymes: No results for input(s): CKTOTAL, CKMB, CKMBINDEX, TROPONINI in the last 168 hours. BNP (last 3 results) No results for input(s): PROBNP in the last 8760 hours. HbA1C: No results for input(s): HGBA1C in the last 72 hours. CBG: No results for input(s): GLUCAP in  the last 168 hours. Lipid Profile: No results for input(s): CHOL, HDL, LDLCALC, TRIG, CHOLHDL, LDLDIRECT in the last 72 hours. Thyroid Function Tests: No results for input(s): TSH, T4TOTAL, FREET4, T3FREE, THYROIDAB in the last 72 hours. Anemia Panel: No results for input(s): VITAMINB12, FOLATE, FERRITIN, TIBC, IRON, RETICCTPCT in the last 72 hours. Urine analysis: No results found for: COLORURINE, APPEARANCEUR, LABSPEC, PHURINE, GLUCOSEU, HGBUR, BILIRUBINUR, KETONESUR, PROTEINUR, UROBILINOGEN, NITRITE, LEUKOCYTESUR Sepsis Labs: @LABRCNTIP (procalcitonin:4,lacticidven:4) ) Recent Results (from the past 240 hour(s))  Resp Panel by RT-PCR (Flu A&B, Covid) Nasopharyngeal Swab     Status: None   Collection Time: 03/21/21  4:48 PM   Specimen: Nasopharyngeal Swab; Nasopharyngeal(NP) swabs in vial transport medium  Result Value Ref Range Status   SARS Coronavirus 2 by RT PCR NEGATIVE NEGATIVE Final    Comment: (  NOTE) SARS-CoV-2 target nucleic acids are NOT DETECTED.  The SARS-CoV-2 RNA is generally detectable in upper respiratory specimens during the acute phase of infection. The lowest concentration of SARS-CoV-2 viral copies this assay can detect is 138 copies/mL. A negative result does not preclude SARS-Cov-2 infection and should not be used as the sole basis for treatment or other patient management decisions. A negative result may occur with  improper specimen collection/handling, submission of specimen other than nasopharyngeal swab, presence of viral mutation(s) within the areas targeted by this assay, and inadequate number of viral copies(<138 copies/mL). A negative result must be combined with clinical observations, patient history, and epidemiological information. The expected result is Negative.  Fact Sheet for Patients:  EntrepreneurPulse.com.au  Fact Sheet for Healthcare Providers:  IncredibleEmployment.be  This test is no t yet approved  or cleared by the [place] FDA and  has been authorized for detection and/or diagnosis of SARS-CoV-2 by FDA under an Emergency Use Authorization (EUA). This EUA will remain  in effect (meaning this test can be used) for the duration of the COVID-19 declaration under Section 564(b)(1) of the Act, 21 U.S.C.section 360bbb-3(b)(1), unless the authorization is terminated  or revoked sooner.       Influenza A by PCR NEGATIVE NEGATIVE Final   Influenza B by PCR NEGATIVE NEGATIVE Final    Comment: (NOTE) The Xpert Xpress SARS-CoV-2/FLU/RSV plus assay is intended as an aid in the diagnosis of influenza from Nasopharyngeal swab specimens and should not be used as a sole basis for treatment. Nasal washings and aspirates are unacceptable for Xpert Xpress SARS-CoV-2/FLU/RSV testing.  Fact Sheet for Patients: EntrepreneurPulse.com.au  Fact Sheet for Healthcare Providers: IncredibleEmployment.be  This test is not yet approved or cleared by the [place] FDA and has been authorized for detection and/or diagnosis of SARS-CoV-2 by FDA under an Emergency Use Authorization (EUA). This EUA will remain in effect (meaning this test can be used) for the duration of the COVID-19 declaration under Section 564(b)(1) of the Act, 21 U.S.C. section 360bbb-3(b)(1), unless the authorization is terminated or revoked.  Performed at [facility], [address] [place], [place] [address]      Radiological Exams on Admission: DG Chest [facility] 1 View  Result Date: 03/21/2021 CLINICAL DATA:  Shortness of breath EXAM: PORTABLE CHEST 1 VIEW COMPARISON:  Chest x-ray 09/05/2016 FINDINGS: Somewhat limited evaluation due to body habitus and portable technique. Heart is enlarged. Mediastinum appears within normal limits. Pulmonary vasculature appears prominent with no focal consolidation visualized. No pleural effusion or pneumothorax identified. IMPRESSION:  Cardiomegaly with possible pulmonary vascular prominence. Limited study. Electronically Signed   By: Ofilia Neas M.D.   On: 03/21/2021 15:55    EKG: Independently reviewed. Sinus rhythm.   Assessment/Plan   1. Abnormal uterine bleeding; anemia  - Pt with daily vaginal bleeding for several months presents with persistent bleeding and worsening DOE   - Hgb is 8.7 in ED (was 10.2 in November and 7.6 in October; she reports no transfusion since then)  - She is scheduled to see OBGYN for this in February  - Type and screen, monitor H&H, may need inpatient OBGYN consultation    2. HFpEF  - Appears compensated, BNP normal in ED, pt reports no recent leg swelling  - Had echo in Oct 2022 with normal LV EF, normal diastolic parameters, and normal RV systolic function  - Continue Lasix, monitor volume status    3. OSA; chronic hypoxic & hypercarbic respiratory failure  -  Unable to tolerate CPAP, uses 2-3 Lpm at home only while sleeping  - Continue supplemental O2    4. Asthma  - No cough or wheezing on admission  - Continue albuterol as needed    DVT prophylaxis: SCDs  Code Status: Full  Level of Care: Level of care: Telemetry Medical Family Communication: None present  Disposition Plan:  Patient is from: Home  Anticipated d/c is to: Home  Anticipated d/c date is: 03/23/21  Patient currently: Pending stable H&H, may need transfusion and/or OBGYN consultation  Consults called: none  Admission status: Inpatient     Vianne Bulls, MD [facility]  03/22/2021, 12:04 AM

## 2021-03-22 ENCOUNTER — Inpatient Hospital Stay (HOSPITAL_COMMUNITY): Payer: No Typology Code available for payment source

## 2021-03-22 DIAGNOSIS — I509 Heart failure, unspecified: Secondary | ICD-10-CM

## 2021-03-22 DIAGNOSIS — J45909 Unspecified asthma, uncomplicated: Secondary | ICD-10-CM | POA: Diagnosis present

## 2021-03-22 LAB — CBC
HCT: 31.3 % — ABNORMAL LOW (ref 36.0–46.0)
Hemoglobin: 8.7 g/dL — ABNORMAL LOW (ref 12.0–15.0)
MCH: 24.2 pg — ABNORMAL LOW (ref 26.0–34.0)
MCHC: 27.8 g/dL — ABNORMAL LOW (ref 30.0–36.0)
MCV: 87.2 fL (ref 80.0–100.0)
Platelets: 323 10*3/uL (ref 150–400)
RBC: 3.59 MIL/uL — ABNORMAL LOW (ref 3.87–5.11)
RDW: 18.6 % — ABNORMAL HIGH (ref 11.5–15.5)
WBC: 7.2 10*3/uL (ref 4.0–10.5)
nRBC: 0 % (ref 0.0–0.2)

## 2021-03-22 LAB — TYPE AND SCREEN
ABO/RH(D): B POS
Antibody Screen: NEGATIVE

## 2021-03-22 LAB — BASIC METABOLIC PANEL
Anion gap: 8 (ref 5–15)
BUN: 7 mg/dL (ref 6–20)
CO2: 31 mmol/L (ref 22–32)
Calcium: 8.1 mg/dL — ABNORMAL LOW (ref 8.9–10.3)
Chloride: 96 mmol/L — ABNORMAL LOW (ref 98–111)
Creatinine, Ser: 0.85 mg/dL (ref 0.44–1.00)
GFR, Estimated: >60 mL/min (ref >60)
Glucose, Bld: 125 mg/dL — ABNORMAL HIGH (ref 70–99)
Potassium: 3.9 mmol/L (ref 3.5–5.1)
Sodium: 135 mmol/L (ref 135–145)

## 2021-03-22 LAB — HIV ANTIBODY (ROUTINE TESTING W REFLEX): HIV Screen 4th Generation wRfx: NONREACTIVE

## 2021-03-22 MED ORDER — ASCORBIC ACID 500 MG PO TABS
1000.0000 mg | ORAL_TABLET | Freq: Every day | ORAL | Status: DC
  Administered 2021-03-22 – 2021-03-23 (×2): 1000 mg via ORAL
  Filled 2021-03-22 (×2): qty 2

## 2021-03-22 MED ORDER — FERROUS SULFATE 325 (65 FE) MG PO TABS
325.0000 mg | ORAL_TABLET | Freq: Every day | ORAL | Status: DC
  Administered 2021-03-23: 09:00:00 325 mg via ORAL
  Filled 2021-03-22 (×2): qty 1

## 2021-03-22 MED ORDER — MEDROXYPROGESTERONE ACETATE 10 MG PO TABS
10.0000 mg | ORAL_TABLET | Freq: Two times a day (BID) | ORAL | Status: DC
  Administered 2021-03-22 – 2021-03-23 (×3): 10 mg via ORAL
  Filled 2021-03-22 (×4): qty 1

## 2021-03-22 NOTE — Progress Notes (Signed)
[place] visited pt. per [facility] consult for assistance with Advance Directive.  When [facility] arrived pt. speaking w/husband on room phone; when pt. finished conversation she shared tearfully that she feels pressure from her family to meet their needs despite her own needs and limitations.  Pt. and family recently lost their housing and are staying with family temporarily; pt. expressed sense of a need for a "new start" in this coming new year and asked for prayer for God's favor and provision.  [place] prayed for pt. to experience sense of divine companionship and peace.  Pt. is interested in completing AD and says she would want her husband and eldest daughter to be her medical decisionmakers should she become unable to speak for herself.  [place] brought pt. document but when [facility] arrived medical staff were performing procedure.  F/U from chaplains requested to discuss document if possible.  Lindaann Pascal, Chaplain Pager: [phone]

## 2021-03-22 NOTE — Plan of Care (Signed)

## 2021-03-22 NOTE — Progress Notes (Signed)
Heart Failure Navigator Progress Note  Following this hospitalization to assess for HV TOC readiness. Currently pt on home dose of PO lasix, no new edema. Does not appear to have plan for further cardiac workup.   Will continue to follow to determine if patient would benefit from HV TOC.   Pricilla Holm, MSN, RN Heart Failure Nurse Navigator [phone]

## 2021-03-22 NOTE — Progress Notes (Signed)
PROGRESS NOTE    Renee Paul  XBD:[id number] DOB: February 21, 1986 DOA: 03/21/2021 PCP: [facility]    Brief Narrative:  35 year old with history of morbid obesity, abnormal uterine bleeding, chronic adenocystic anemia, obstructive sleep apnea and obesity hypoventilation intolerance to CPAP, heart failure with preserved ejection fraction presented to the ER with weakness, ongoing vaginal spotting for last 2 months.  Takes Lasix sometimes but misses the doses to not to urinate frequently.  Denies any leg swelling.   Assessment & Plan:   Principal Problem:   Vaginal bleeding Active Problems:   Symptomatic anemia   Chronic respiratory failure with hypoxia and hypercapnia (HCC)   OSA (obstructive sleep apnea)   Asthma   CHF (congestive heart failure) (HCC)  Vaginal bleeding in a patient with known abnormal uterine bleeding treated with progesterone: Chronic iron deficiency anemia: -Patient with known abnormal uterine bleeding, resume progesterone.  Transvaginal ultrasound today.  If any urgent attention needing pathology, will consult OB/GYN as inpatient.  If not urgent, will send referral to follow-up.  Patient does have follow-up with OB/GYN at outside hospital not until February.  She could not arrange an appointment on her off schedule days and cannot afford to not to go to work. -Has chronic anemia.  Baseline hemoglobin is 8-9.  Records available on Care Everywhere for the last 2 years.  No indication to transfuse at this time.  Will monitor.  Shortness of breath, multifactorial.  Untreated sleep apnea and chronic hypoxemia.  Obesity hypoventilation syndrome.  Intolerance to CPAP.  Chronic diastolic heart failure: -No clinical evidence of fluid overload.  Continue trial of diuresis today and monitor electrolytes. -Patient is intolerance to CPAP and does not want to go back on CPAP. -Recent echocardiogram was with normal ejection fraction. -Discussed compliance to  dialysis.  Morbid obesity: BMI more than 40.  She will definitely benefit with lifestyle modifications and dietary modifications.   DVT prophylaxis: SCDs Start: 03/21/21 2340   Code Status: Full code Family Communication: None Disposition Plan: Status is: Inpatient  Remains inpatient appropriate because: Significant shortness of breath         Consultants:  None  Procedures:  Transvaginal ultrasound  Antimicrobials:  None   Subjective: Patient seen and examined.  Denies any complaints.  Falls asleep in between conversation.  Eager to go home.  Objective: Vitals:   03/21/21 2234 03/21/21 2236 03/22/21 0606 03/22/21 0712  BP:  127/71 137/60 113/68  Pulse:  93 95 87  Resp:  18 18 20   Temp: 98.8 F (37.1 C) 98.8 F (37.1 C) 99 F (37.2 C) 99 F (37.2 C)  TempSrc: Oral Oral Oral Oral  SpO2:  100% 96% 99%  Weight:  (!) 214 kg (!) 212.3 kg     Intake/Output Summary (Last 24 hours) at 03/22/2021 1144 Last data filed at 03/22/2021 0900 Gross per 24 hour  Intake 580 ml  Output 1250 ml  Net -670 ml   Filed Weights   03/21/21 2236 03/22/21 0606  Weight: (!) 214 kg (!) 212.3 kg    Examination:  General exam: Appears calm and comfortable  Morbidly obese.  Not in any distress.  Sleepy. Respiratory system: Clear to auscultation. Respiratory effort normal.  No added sounds. Cardiovascular system: S1 & S2 heard, RRR.  No pitting edema. Gastrointestinal system: Soft.  Obese and pendulous.  Bowel sound present.   Central nervous system: Alert and oriented. No focal neurological deficits. Extremities: Symmetric 5 x 5 power. Skin: No rashes, lesions or ulcers  Psychiatry: Judgement and insight appear normal. Mood & affect appropriate.     Data Reviewed: I have personally reviewed following labs and imaging studies  CBC: Recent Labs  Lab 03/21/21 1541 03/22/21 0358  WBC 7.7 7.2  NEUTROABS 5.8  --   HGB 8.7* 8.7*  HCT 30.9* 31.3*  MCV 85.1 87.2  PLT 324  778   Basic Metabolic Panel: Recent Labs  Lab 03/21/21 1541 03/22/21 0358  NA 137 135  K 3.9 3.9  CL 99 96*  CO2 33* 31  GLUCOSE 92 125*  BUN 9 7  CREATININE 0.88 0.85  CALCIUM 8.3* 8.1*   GFR: CrCl cannot be calculated (Unknown ideal weight.). Liver Function Tests: Recent Labs  Lab 03/21/21 1541  AST 12*  ALT 13  ALKPHOS 48  BILITOT 0.5  PROT 7.6  ALBUMIN 3.0*   No results for input(s): LIPASE, AMYLASE in the last 168 hours. No results for input(s): AMMONIA in the last 168 hours. Coagulation Profile: No results for input(s): INR, PROTIME in the last 168 hours. Cardiac Enzymes: No results for input(s): CKTOTAL, CKMB, CKMBINDEX, TROPONINI in the last 168 hours. BNP (last 3 results) No results for input(s): PROBNP in the last 8760 hours. HbA1C: No results for input(s): HGBA1C in the last 72 hours. CBG: No results for input(s): GLUCAP in the last 168 hours. Lipid Profile: No results for input(s): CHOL, HDL, LDLCALC, TRIG, CHOLHDL, LDLDIRECT in the last 72 hours. Thyroid Function Tests: No results for input(s): TSH, T4TOTAL, FREET4, T3FREE, THYROIDAB in the last 72 hours. Anemia Panel: No results for input(s): VITAMINB12, FOLATE, FERRITIN, TIBC, IRON, RETICCTPCT in the last 72 hours. Sepsis Labs: No results for input(s): PROCALCITON, LATICACIDVEN in the last 168 hours.  Recent Results (from the past 240 hour(s))  Resp Panel by RT-PCR (Flu A&B, Covid) Nasopharyngeal Swab     Status: None   Collection Time: 03/21/21  4:48 PM   Specimen: Nasopharyngeal Swab; Nasopharyngeal(NP) swabs in vial transport medium  Result Value Ref Range Status   SARS Coronavirus 2 by RT PCR NEGATIVE NEGATIVE Final    Comment: (NOTE) SARS-CoV-2 target nucleic acids are NOT DETECTED.  The SARS-CoV-2 RNA is generally detectable in upper respiratory specimens during the acute phase of infection. The lowest concentration of SARS-CoV-2 viral copies this assay can detect is 138 copies/mL. A  negative result does not preclude SARS-Cov-2 infection and should not be used as the sole basis for treatment or other patient management decisions. A negative result may occur with  improper specimen collection/handling, submission of specimen other than nasopharyngeal swab, presence of viral mutation(s) within the areas targeted by this assay, and inadequate number of viral copies(<138 copies/mL). A negative result must be combined with clinical observations, patient history, and epidemiological information. The expected result is Negative.  Fact Sheet for Patients:  EntrepreneurPulse.com.au  Fact Sheet for Healthcare Providers:  IncredibleEmployment.be  This test is no t yet approved or cleared by the [place] FDA and  has been authorized for detection and/or diagnosis of SARS-CoV-2 by FDA under an Emergency Use Authorization (EUA). This EUA will remain  in effect (meaning this test can be used) for the duration of the COVID-19 declaration under Section 564(b)(1) of the Act, 21 U.S.C.section 360bbb-3(b)(1), unless the authorization is terminated  or revoked sooner.       Influenza A by PCR NEGATIVE NEGATIVE Final   Influenza B by PCR NEGATIVE NEGATIVE Final    Comment: (NOTE) The Xpert Xpress SARS-CoV-2/FLU/RSV plus assay is intended as  an aid in the diagnosis of influenza from Nasopharyngeal swab specimens and should not be used as a sole basis for treatment. Nasal washings and aspirates are unacceptable for Xpert Xpress SARS-CoV-2/FLU/RSV testing.  Fact Sheet for Patients: EntrepreneurPulse.com.au  Fact Sheet for Healthcare Providers: IncredibleEmployment.be  This test is not yet approved or cleared by the [place] FDA and has been authorized for detection and/or diagnosis of SARS-CoV-2 by FDA under an Emergency Use Authorization (EUA). This EUA will remain in effect (meaning this test can  be used) for the duration of the COVID-19 declaration under Section 564(b)(1) of the Act, 21 U.S.C. section 360bbb-3(b)(1), unless the authorization is terminated or revoked.  Performed at [facility], [address] [place], [place] [address]          Radiology Studies: DG Chest [place] 1 View  Result Date: 03/21/2021 CLINICAL DATA:  Shortness of breath EXAM: PORTABLE CHEST 1 VIEW COMPARISON:  Chest x-ray 09/05/2016 FINDINGS: Somewhat limited evaluation due to body habitus and portable technique. Heart is enlarged. Mediastinum appears within normal limits. Pulmonary vasculature appears prominent with no focal consolidation visualized. No pleural effusion or pneumothorax identified. IMPRESSION: Cardiomegaly with possible pulmonary vascular prominence. Limited study. Electronically Signed   By: Ofilia Neas M.D.   On: 03/21/2021 15:55        Scheduled Meds:  vitamin C  1,000 mg Oral Daily   [START ON 03/23/2021] ferrous sulfate  325 mg Oral Q breakfast   furosemide  80 mg Oral Daily   medroxyPROGESTERone  10 mg Oral BID   sodium chloride flush  3 mL Intravenous Q12H   Continuous Infusions:  sodium chloride       LOS: 1 day    Time spent: 30 minutes    Barb Merino, MD [facility] Pager [phone]

## 2021-03-23 LAB — BASIC METABOLIC PANEL
Anion gap: 7 (ref 5–15)
BUN: 8 mg/dL (ref 6–20)
CO2: 32 mmol/L (ref 22–32)
Calcium: 8.2 mg/dL — ABNORMAL LOW (ref 8.9–10.3)
Chloride: 97 mmol/L — ABNORMAL LOW (ref 98–111)
Creatinine, Ser: 0.73 mg/dL (ref 0.44–1.00)
GFR, Estimated: >60 mL/min (ref >60)
Glucose, Bld: 106 mg/dL — ABNORMAL HIGH (ref 70–99)
Potassium: 4.4 mmol/L (ref 3.5–5.1)
Sodium: 136 mmol/L (ref 135–145)

## 2021-03-23 LAB — ABO/RH: ABO/RH(D): B POS

## 2021-03-23 LAB — CBC
HCT: 30.4 % — ABNORMAL LOW (ref 36.0–46.0)
Hemoglobin: 8.2 g/dL — ABNORMAL LOW (ref 12.0–15.0)
MCH: 23.1 pg — ABNORMAL LOW (ref 26.0–34.0)
MCHC: 27 g/dL — ABNORMAL LOW (ref 30.0–36.0)
MCV: 85.6 fL (ref 80.0–100.0)
Platelets: UNDETERMINED 10*3/uL (ref 150–400)
RBC: 3.55 MIL/uL — ABNORMAL LOW (ref 3.87–5.11)
RDW: 18.2 % — ABNORMAL HIGH (ref 11.5–15.5)
WBC: 7.3 10*3/uL (ref 4.0–10.5)
nRBC: 0.4 % — ABNORMAL HIGH (ref 0.0–0.2)

## 2021-03-23 MED ORDER — HYDROCODONE-ACETAMINOPHEN 5-325 MG PO TABS
1.0000 | ORAL_TABLET | Freq: Four times a day (QID) | ORAL | Status: DC | PRN
  Administered 2021-03-23: 02:00:00 1 via ORAL
  Filled 2021-03-23: qty 1

## 2021-03-23 MED ORDER — FERROUS SULFATE 325 (65 FE) MG PO TABS: 325.0000 mg | ORAL_TABLET | Freq: Every day | ORAL | 0 refills | Status: DC

## 2021-03-23 NOTE — Progress Notes (Signed)
Heart Failure Nurse Navigator Progress Note  No plan for HV TOC clinic at this time. Pt has f/u scheduled with PCP office with [facility].   Pricilla Holm, MSN, RN Heart Failure Nurse Navigator [phone]

## 2021-03-23 NOTE — Plan of Care (Signed)

## 2021-03-23 NOTE — TOC Progression Note (Addendum)
Transition of Care [facility]) - Progression Note    Patient Details  Name: Siarra Gilkerson MRN: [id number] Date of Birth: Mar 27, 1985  [facility]) CM/SW [place], RN Phone [phone]  03/23/2021, 9:30 AM  Clinical Narrative:    CM at bedside for heart failure assessment but patient states that she just passed a large clot and she has a headace and is unable to talk right now. Cm will attempt to assess at a later time.  [place] PCP appointment has been set up with [place]. The office states that they will refer patient to gynecologist.        Expected Discharge Plan and Services                                                 Social Determinants of Health (SDOH) Interventions    Readmission Risk Interventions No flowsheet data found.

## 2021-03-23 NOTE — Discharge Summary (Signed)
Physician Discharge Summary  Renee Paul LKG:[id number] DOB: Jul 24, 1985 DOA: 03/21/2021  PCP: [facility] [place] date: 03/21/2021 Discharge date: 03/23/2021  Admitted From: Home Disposition: Home  Recommendations for Outpatient Follow-up:  Follow up with PCP in 1-2 weeks as scheduled.  They will refer you to gynecology. Recheck hemoglobin in 1 week.  Home Health: Not applicable Equipment/Devices: Oxygen available at home  Discharge Condition: Stable CODE STATUS: Full code Diet recommendation: Regular diet, fluid restriction 1500 mL a day.  Discharge summary: 35 year old with history of morbid obesity, abnormal uterine bleeding, chronic iron deficiency anemia, obstructive sleep apnea and obesity hypoventilation intolerance to CPAP, heart failure with preserved ejection fraction presented to the ER with weakness, ongoing vaginal spotting for last 2 months.  Takes Lasix sometimes but misses the doses to not to urinate frequently.  Denies any leg swelling.     Vaginal bleeding in a patient with known abnormal uterine bleeding treated with progesterone: Chronic iron deficiency anemia: -Patient with known abnormal uterine bleeding, resume progesterone.  -Transvaginal ultrasound with thickened endometrium and also with fibroids.  -Has chronic anemia.  Baseline hemoglobin is 8-9.  Records available on Care Everywhere for the last 2 years.  No indication to transfuse at this time.  Hemoglobin 8.2.  Not much change from baseline.  Resume iron supplements.  She will need endometrial biopsy/ablation and further treatment.  We discussed with her primary care clinic to move her appointment so that she can be seen earlier than February at the gynecologist office.   Shortness of breath, multifactorial.  Untreated sleep apnea and chronic hypoxemia.  Obesity hypoventilation syndrome.  Intolerance to CPAP.  Chronic diastolic heart failure: -No clinical evidence of  fluid overload.  Resume oral Lasix. -Patient is intolerance to CPAP and does not want to go back on CPAP. -Recent echocardiogram was with normal ejection fraction. -Discussed compliance to diuretics.   Morbid obesity: BMI more than 40.  She will definitely benefit with lifestyle modifications and dietary modifications.  Patient is adequately improved.  She uses 2 L oxygen at home at night instead of using CPAP.  She has been denying any dizziness lightheadedness.  He still has some vaginal spotting and occasional bleeding.  Hemoglobin has remained stable for last 2 months.  Discussed results with her, she is agreeable to keep up with appointments with her primary care physician and they will refer her to gynecology.  Stable for discharge.   Discharge Diagnoses:  Principal Problem:   Vaginal bleeding Active Problems:   Symptomatic anemia   Chronic respiratory failure with hypoxia and hypercapnia (HCC)   OSA (obstructive sleep apnea)   Asthma   CHF (congestive heart failure) [facility])    Discharge Instructions  Discharge Instructions     Ambulatory referral to Gynecology   Complete by: As directed    Call MD for:  extreme fatigue   Complete by: As directed    Diet - low sodium heart healthy   Complete by: As directed    Discharge instructions   Complete by: As directed    Keep up your appointment with primary care doctor and subsequent with gynecology.   Increase activity slowly   Complete by: As directed       Allergies as of 03/23/2021   No Known Allergies      Medication List     TAKE these medications    albuterol 108 (90 Base) MCG/ACT inhaler Commonly known as: VENTOLIN HFA Inhale 2 puffs into the lungs every  4 (four) hours as needed for wheezing or shortness of breath.   ferrous sulfate 325 (65 FE) MG tablet Take 1 tablet (325 mg total) by mouth daily with breakfast.   furosemide 40 MG tablet Commonly known as: LASIX Take 80 mg by mouth daily.   ibuprofen  800 MG tablet Commonly known as: ADVIL Take 800 mg by mouth every 6 (six) hours as needed for pain.   medroxyPROGESTERone 10 MG tablet Commonly known as: PROVERA Take 10 mg by mouth 2 (two) times daily. X 30 days   vitamin C 1000 MG tablet Take 1,000 mg by mouth daily.        Follow-up Information     [facility] [place]. Go on 04/04/2021.   Specialty: Family Medicine Why: @4 :00pm With Estrella Myrtle and PCP will set up follow up with Gynecologist..               No Known Allergies  Consultations: None   Procedures/Studies: DG Chest Port 1 View  Result Date: 03/21/2021 CLINICAL DATA:  Shortness of breath EXAM: PORTABLE CHEST 1 VIEW COMPARISON:  Chest x-ray 09/05/2016 FINDINGS: Somewhat limited evaluation due to body habitus and portable technique. Heart is enlarged. Mediastinum appears within normal limits. Pulmonary vasculature appears prominent with no focal consolidation visualized. No pleural effusion or pneumothorax identified. IMPRESSION: Cardiomegaly with possible pulmonary vascular prominence. Limited study. Electronically Signed   By: Ofilia Neas M.D.   On: 03/21/2021 15:55   US PELVIC COMPLETE WITH TRANSVAGINAL  Result Date: 03/22/2021 CLINICAL DATA:  Vaginal bleeding EXAM: TRANSABDOMINAL ULTRASOUND OF PELVIS TECHNIQUE: Transabdominal ultrasound examination of the pelvis was performed including evaluation of the uterus, ovaries, adnexal regions, and pelvic cul-de-sac. COMPARISON:  None. FINDINGS: Uterus Measurements: 14.6 x 7.5 x 7.6 cm = volume: 438.6 mL. There is inhomogeneous echogenicity in myometrium. There are multiple uterine fibroids. There is 2.8 x 2 x 3.4 cm fibroid in the posterior fundus. There is 2.8 x 2.7 x 3 cm fibroid in the fundus. There is 3.6 x 2 x 2.9 cm fibroid in the fundus. Endometrium Thickness: Not adequately visualized due to inhomogeneous echogenicity in myometrium. There is possible thickening of endometrial  stripe measuring 2.4 cm. Cervix is closed. There is no fluid in the endometrial cavity. Right ovary Measurements: 3.5 x 2 x 2.4 cm = volume: 8.5 mL. Normal appearance/no adnexal mass. Left ovary Measurements: 4 x 2.3 x 2.7 cm = volume: 12.9 mL. Normal appearance/no adnexal mass. Other findings:  No abnormal free fluid. IMPRESSION: There is inhomogeneous echogenicity in the myometrium with multiple fibroids. Inhomogeneous echogenicity in myometrium limits evaluation of endometrial stripe. There is possible thickening of endometrial stripe measuring 2.4 cm. There is no definite abnormal increased vascularity in the endometrium. There are no dominant adnexal masses. There is no free fluid in the pelvis. Electronically Signed   By: Elmer Picker M.D.   On: 03/22/2021 13:44   (Echo, Carotid, EGD, Colonoscopy, ERCP)    Subjective: Patient seen and examined.  Denies any complaints.  She still has occasional vaginal bleeding.  Denies any other complaints.  Denies any chest pain or shortness of breath.  Eager to go home.  Her younger daughter has birthday tomorrow.   Discharge Exam: Vitals:   03/23/21 0450 03/23/21 1107  BP: 112/71 113/62  Pulse: 82 74  Resp: 20 20  Temp: 98.8 F (37.1 C) 98.9 F (37.2 C)  SpO2: 100% 91%   Vitals:   03/22/21 1140 03/22/21 2042 03/23/21 0450 03/23/21 1107  BP: 136/75 136/65 112/71 113/62  Pulse: 80 84 82 74  Resp: 20 20 20 20   Temp: 98.4 F (36.9 C) 99.7 F (37.6 C) 98.8 F (37.1 C) 98.9 F (37.2 C)  TempSrc: Oral Oral Oral Oral  SpO2: 95% 100% 100% 91%  Weight:   (!) 213 kg     General: Pt is alert, awake, not in acute distress Morbidly obese.  Looks comfortable.  Not in any distress.  Using minimum oxygen while sleeping. Cardiovascular: RRR, S1/S2 +, no rubs, no gallops Respiratory: CTA bilaterally, no wheezing, no rhonchi Abdominal: Soft, NT, ND, bowel sounds +, obese and pendulous. Extremities: She has large legs.  There is no evidence of  pitting.    The results of significant diagnostics from this hospitalization (including imaging, microbiology, ancillary and laboratory) are listed below for reference.     Microbiology: Recent Results (from the past 240 hour(s))  Resp Panel by RT-PCR (Flu A&B, Covid) Nasopharyngeal Swab     Status: None   Collection Time: 03/21/21  4:48 PM   Specimen: Nasopharyngeal Swab; Nasopharyngeal(NP) swabs in vial transport medium  Result Value Ref Range Status   SARS Coronavirus 2 by RT PCR NEGATIVE NEGATIVE Final    Comment: (NOTE) SARS-CoV-2 target nucleic acids are NOT DETECTED.  The SARS-CoV-2 RNA is generally detectable in upper respiratory specimens during the acute phase of infection. The lowest concentration of SARS-CoV-2 viral copies this assay can detect is 138 copies/mL. A negative result does not preclude SARS-Cov-2 infection and should not be used as the sole basis for treatment or other patient management decisions. A negative result may occur with  improper specimen collection/handling, submission of specimen other than nasopharyngeal swab, presence of viral mutation(s) within the areas targeted by this assay, and inadequate number of viral copies(<138 copies/mL). A negative result must be combined with clinical observations, patient history, and epidemiological information. The expected result is Negative.  Fact Sheet for Patients:  EntrepreneurPulse.com.au  Fact Sheet for Healthcare Providers:  IncredibleEmployment.be  This test is no t yet approved or cleared by the [place] FDA and  has been authorized for detection and/or diagnosis of SARS-CoV-2 by FDA under an Emergency Use Authorization (EUA). This EUA will remain  in effect (meaning this test can be used) for the duration of the COVID-19 declaration under Section 564(b)(1) of the Act, 21 U.S.C.section 360bbb-3(b)(1), unless the authorization is terminated  or revoked  sooner.       Influenza A by PCR NEGATIVE NEGATIVE Final   Influenza B by PCR NEGATIVE NEGATIVE Final    Comment: (NOTE) The Xpert Xpress SARS-CoV-2/FLU/RSV plus assay is intended as an aid in the diagnosis of influenza from Nasopharyngeal swab specimens and should not be used as a sole basis for treatment. Nasal washings and aspirates are unacceptable for Xpert Xpress SARS-CoV-2/FLU/RSV testing.  Fact Sheet for Patients: EntrepreneurPulse.com.au  Fact Sheet for Healthcare Providers: IncredibleEmployment.be  This test is not yet approved or cleared by the [place] FDA and has been authorized for detection and/or diagnosis of SARS-CoV-2 by FDA under an Emergency Use Authorization (EUA). This EUA will remain in effect (meaning this test can be used) for the duration of the COVID-19 declaration under Section 564(b)(1) of the Act, 21 U.S.C. section 360bbb-3(b)(1), unless the authorization is terminated or revoked.  Performed at [facility], [address] [place], [place] [address]      Labs: BNP (last 3 results) Recent Labs    03/21/21 1541  BNP  67.6   Basic Metabolic Panel: Recent Labs  Lab 03/21/21 1541 03/22/21 0358 03/23/21 0455  NA 137 135 136  K 3.9 3.9 4.4  CL 99 96* 97*  CO2 33* 31 32  GLUCOSE 92 125* 106*  BUN 9 7 8   CREATININE 0.88 0.85 0.73  CALCIUM 8.3* 8.1* 8.2*   Liver Function Tests: Recent Labs  Lab 03/21/21 1541  AST 12*  ALT 13  ALKPHOS 48  BILITOT 0.5  PROT 7.6  ALBUMIN 3.0*   No results for input(s): LIPASE, AMYLASE in the last 168 hours. No results for input(s): AMMONIA in the last 168 hours. CBC: Recent Labs  Lab 03/21/21 1541 03/22/21 0358 03/23/21 0455  WBC 7.7 7.2 7.3  NEUTROABS 5.8  --   --   HGB 8.7* 8.7* 8.2*  HCT 30.9* 31.3* 30.4*  MCV 85.1 87.2 85.6  PLT 324 323 PLATELET CLUMPS NOTED ON SMEAR, UNABLE TO ESTIMATE   Cardiac Enzymes: No results for input(s):  CKTOTAL, CKMB, CKMBINDEX, TROPONINI in the last 168 hours. BNP: Invalid input(s): POCBNP CBG: No results for input(s): GLUCAP in the last 168 hours. D-Dimer No results for input(s): DDIMER in the last 72 hours. Hgb A1c No results for input(s): HGBA1C in the last 72 hours. Lipid Profile No results for input(s): CHOL, HDL, LDLCALC, TRIG, CHOLHDL, LDLDIRECT in the last 72 hours. Thyroid function studies No results for input(s): TSH, T4TOTAL, T3FREE, THYROIDAB in the last 72 hours.  Invalid input(s): FREET3 Anemia work up No results for input(s): VITAMINB12, FOLATE, FERRITIN, TIBC, IRON, RETICCTPCT in the last 72 hours. Urinalysis No results found for: COLORURINE, APPEARANCEUR, [place], [place], GLUCOSEU, [place], [place], [place], PROTEINUR, UROBILINOGEN, NITRITE, LEUKOCYTESUR Sepsis Labs Invalid input(s): PROCALCITONIN,  WBC,  LACTICIDVEN Microbiology Recent Results (from the past 240 hour(s))  Resp Panel by RT-PCR (Flu A&B, Covid) Nasopharyngeal Swab     Status: None   Collection Time: 03/21/21  4:48 PM   Specimen: Nasopharyngeal Swab; Nasopharyngeal(NP) swabs in vial transport medium  Result Value Ref Range Status   SARS Coronavirus 2 by RT PCR NEGATIVE NEGATIVE Final    Comment: (NOTE) SARS-CoV-2 target nucleic acids are NOT DETECTED.  The SARS-CoV-2 RNA is generally detectable in upper respiratory specimens during the acute phase of infection. The lowest concentration of SARS-CoV-2 viral copies this assay can detect is 138 copies/mL. A negative result does not preclude SARS-Cov-2 infection and should not be used as the sole basis for treatment or other patient management decisions. A negative result may occur with  improper specimen collection/handling, submission of specimen other than nasopharyngeal swab, presence of viral mutation(s) within the areas targeted by this assay, and inadequate number of viral copies(<138 copies/mL). A negative result must be combined  with clinical observations, patient history, and epidemiological information. The expected result is Negative.  Fact Sheet for Patients:  EntrepreneurPulse.com.au  Fact Sheet for Healthcare Providers:  IncredibleEmployment.be  This test is no t yet approved or cleared by the [place] FDA and  has been authorized for detection and/or diagnosis of SARS-CoV-2 by FDA under an Emergency Use Authorization (EUA). This EUA will remain  in effect (meaning this test can be used) for the duration of the COVID-19 declaration under Section 564(b)(1) of the Act, 21 U.S.C.section 360bbb-3(b)(1), unless the authorization is terminated  or revoked sooner.       Influenza A by PCR NEGATIVE NEGATIVE Final   Influenza B by PCR NEGATIVE NEGATIVE Final    Comment: (NOTE) The Xpert Xpress SARS-CoV-2/FLU/RSV plus assay is  intended as an aid in the diagnosis of influenza from Nasopharyngeal swab specimens and should not be used as a sole basis for treatment. Nasal washings and aspirates are unacceptable for Xpert Xpress SARS-CoV-2/FLU/RSV testing.  Fact Sheet for Patients: EntrepreneurPulse.com.au  Fact Sheet for Healthcare Providers: IncredibleEmployment.be  This test is not yet approved or cleared by the [place] FDA and has been authorized for detection and/or diagnosis of SARS-CoV-2 by FDA under an Emergency Use Authorization (EUA). This EUA will remain in effect (meaning this test can be used) for the duration of the COVID-19 declaration under Section 564(b)(1) of the Act, 21 U.S.C. section 360bbb-3(b)(1), unless the authorization is terminated or revoked.  Performed at [facility], [address], [place], [place] [address]      Time coordinating discharge:  32 minutes  SIGNED:   Barb Merino, MD  [facility] 03/23/2021, 12:43 PM

## 2021-06-04 ENCOUNTER — Inpatient Hospital Stay (HOSPITAL_COMMUNITY)
Admission: EM | Admit: 2021-06-04 | Discharge: 2021-06-10 | DRG: 291 | Disposition: A | Discharge status: Home / Self Care | Payer: No Typology Code available for payment source | Attending: Family Medicine | Admitting: Family Medicine

## 2021-06-04 ENCOUNTER — Emergency Department (HOSPITAL_COMMUNITY): Payer: No Typology Code available for payment source

## 2021-06-04 ENCOUNTER — Encounter (HOSPITAL_COMMUNITY): Payer: Self-pay | Admitting: Emergency Medicine

## 2021-06-04 DIAGNOSIS — R946 Abnormal results of thyroid function studies: Secondary | ICD-10-CM | POA: Diagnosis present

## 2021-06-04 DIAGNOSIS — R7989 Other specified abnormal findings of blood chemistry: Secondary | ICD-10-CM

## 2021-06-04 DIAGNOSIS — Z91128 Patient's intentional underdosing of medication regimen for other reason: Secondary | ICD-10-CM

## 2021-06-04 DIAGNOSIS — J9611 Chronic respiratory failure with hypoxia: Principal | ICD-10-CM

## 2021-06-04 DIAGNOSIS — F17210 Nicotine dependence, cigarettes, uncomplicated: Secondary | ICD-10-CM | POA: Diagnosis present

## 2021-06-04 DIAGNOSIS — J4520 Mild intermittent asthma, uncomplicated: Secondary | ICD-10-CM

## 2021-06-04 DIAGNOSIS — J45909 Unspecified asthma, uncomplicated: Secondary | ICD-10-CM | POA: Diagnosis present

## 2021-06-04 DIAGNOSIS — T501X6A Underdosing of loop [high-ceiling] diuretics, initial encounter: Secondary | ICD-10-CM | POA: Diagnosis present

## 2021-06-04 DIAGNOSIS — N939 Abnormal uterine and vaginal bleeding, unspecified: Secondary | ICD-10-CM | POA: Diagnosis present

## 2021-06-04 DIAGNOSIS — J9621 Acute and chronic respiratory failure with hypoxia: Secondary | ICD-10-CM | POA: Diagnosis present

## 2021-06-04 DIAGNOSIS — Z20822 Contact with and (suspected) exposure to covid-19: Secondary | ICD-10-CM | POA: Diagnosis present

## 2021-06-04 DIAGNOSIS — D62 Acute posthemorrhagic anemia: Secondary | ICD-10-CM | POA: Diagnosis present

## 2021-06-04 DIAGNOSIS — I5033 Acute on chronic diastolic (congestive) heart failure: Secondary | ICD-10-CM | POA: Diagnosis present

## 2021-06-04 DIAGNOSIS — J9601 Acute respiratory failure with hypoxia: Secondary | ICD-10-CM | POA: Diagnosis present

## 2021-06-04 DIAGNOSIS — J9622 Acute and chronic respiratory failure with hypercapnia: Secondary | ICD-10-CM | POA: Diagnosis present

## 2021-06-04 DIAGNOSIS — Z6841 Body Mass Index (BMI) 40.0 and over, adult: Secondary | ICD-10-CM

## 2021-06-04 DIAGNOSIS — R1031 Right lower quadrant pain: Secondary | ICD-10-CM | POA: Diagnosis present

## 2021-06-04 DIAGNOSIS — E873 Alkalosis: Secondary | ICD-10-CM | POA: Diagnosis present

## 2021-06-04 DIAGNOSIS — I5031 Acute diastolic (congestive) heart failure: Secondary | ICD-10-CM | POA: Diagnosis not present

## 2021-06-04 DIAGNOSIS — G4733 Obstructive sleep apnea (adult) (pediatric): Secondary | ICD-10-CM | POA: Diagnosis present

## 2021-06-04 DIAGNOSIS — J9612 Chronic respiratory failure with hypercapnia: Secondary | ICD-10-CM | POA: Diagnosis not present

## 2021-06-04 DIAGNOSIS — D259 Leiomyoma of uterus, unspecified: Secondary | ICD-10-CM | POA: Diagnosis present

## 2021-06-04 DIAGNOSIS — R1032 Left lower quadrant pain: Secondary | ICD-10-CM

## 2021-06-04 LAB — CBC WITH DIFFERENTIAL/PLATELET
Abs Immature Granulocytes: 0.03 10*3/uL (ref 0.00–0.07)
Basophils Absolute: 0 10*3/uL (ref 0.0–0.1)
Basophils Relative: 0 %
Eosinophils Absolute: 0.1 10*3/uL (ref 0.0–0.5)
Eosinophils Relative: 2 %
HCT: 40.1 % (ref 36.0–46.0)
Hemoglobin: 11.2 g/dL — ABNORMAL LOW (ref 12.0–15.0)
Immature Granulocytes: 1 %
Lymphocytes Relative: 23 %
Lymphs Abs: 1.3 10*3/uL (ref 0.7–4.0)
MCH: 27.2 pg (ref 26.0–34.0)
MCHC: 27.9 g/dL — ABNORMAL LOW (ref 30.0–36.0)
MCV: 97.3 fL (ref 80.0–100.0)
Monocytes Absolute: 0.3 10*3/uL (ref 0.1–1.0)
Monocytes Relative: 5 %
Neutro Abs: 3.7 10*3/uL (ref 1.7–7.7)
Neutrophils Relative %: 69 %
Platelets: 264 10*3/uL (ref 150–400)
RBC: 4.12 MIL/uL (ref 3.87–5.11)
RDW: 18.7 % — ABNORMAL HIGH (ref 11.5–15.5)
WBC: 5.4 10*3/uL (ref 4.0–10.5)
nRBC: 0 % (ref 0.0–0.2)

## 2021-06-04 LAB — COMPREHENSIVE METABOLIC PANEL
ALT: 12 U/L (ref 0–44)
AST: 40 U/L (ref 15–41)
Albumin: 3.3 g/dL — ABNORMAL LOW (ref 3.5–5.0)
Alkaline Phosphatase: 47 U/L (ref 38–126)
Anion gap: 11 (ref 5–15)
BUN: 10 mg/dL (ref 6–20)
CO2: 23 mmol/L (ref 22–32)
Calcium: 8.3 mg/dL — ABNORMAL LOW (ref 8.9–10.3)
Chloride: 102 mmol/L (ref 98–111)
Creatinine, Ser: 0.79 mg/dL (ref 0.44–1.00)
GFR, Estimated: >60 mL/min (ref >60)
Glucose, Bld: 74 mg/dL (ref 70–99)
Potassium: 5.9 mmol/L — ABNORMAL HIGH (ref 3.5–5.1)
Sodium: 136 mmol/L (ref 135–145)
Total Bilirubin: 2.4 mg/dL — ABNORMAL HIGH (ref 0.3–1.2)
Total Protein: 7.5 g/dL (ref 6.5–8.1)

## 2021-06-04 LAB — I-STAT CHEM 8, ED
BUN: 9 mg/dL (ref 6–20)
Calcium, Ion: 1.01 mmol/L — ABNORMAL LOW (ref 1.15–1.40)
Chloride: 100 mmol/L (ref 98–111)
Creatinine, Ser: 0.8 mg/dL (ref 0.44–1.00)
Glucose, Bld: 77 mg/dL (ref 70–99)
HCT: 38 % (ref 36.0–46.0)
Hemoglobin: 12.9 g/dL (ref 12.0–15.0)
Potassium: 4.4 mmol/L (ref 3.5–5.1)
Sodium: 139 mmol/L (ref 135–145)
TCO2: 31 mmol/L (ref 22–32)

## 2021-06-04 LAB — WET PREP, GENITAL
Clue Cells Wet Prep HPF POC: NONE SEEN
Sperm: NONE SEEN
Trich, Wet Prep: NONE SEEN
WBC, Wet Prep HPF POC: <10 (ref <10)
Yeast Wet Prep HPF POC: NONE SEEN

## 2021-06-04 LAB — BRAIN NATRIURETIC PEPTIDE: B Natriuretic Peptide: 50 pg/mL (ref 0.0–100.0)

## 2021-06-04 LAB — I-STAT BETA HCG BLOOD, ED (MC, WL, AP ONLY): I-stat hCG, quantitative: <5 m[IU]/mL (ref <5)

## 2021-06-04 LAB — TROPONIN I (HIGH SENSITIVITY)
Troponin I (High Sensitivity): 5 ng/L (ref <18)
Troponin I (High Sensitivity): 5 ng/L (ref <18)

## 2021-06-04 MED ORDER — FUROSEMIDE 10 MG/ML IJ SOLN
60.0000 mg | Freq: Two times a day (BID) | INTRAMUSCULAR | Status: DC
  Administered 2021-06-05 – 2021-06-06 (×4): 60 mg via INTRAVENOUS
  Filled 2021-06-04 (×4): qty 6

## 2021-06-04 MED ORDER — ONDANSETRON HCL 4 MG/2ML IJ SOLN: 4.0000 mg | Freq: Four times a day (QID) | INTRAMUSCULAR | Status: DC | PRN

## 2021-06-04 MED ORDER — FERROUS SULFATE 325 (65 FE) MG PO TABS
325.0000 mg | ORAL_TABLET | Freq: Every day | ORAL | Status: DC
  Administered 2021-06-05 – 2021-06-10 (×6): 325 mg via ORAL
  Filled 2021-06-04 (×6): qty 1

## 2021-06-04 MED ORDER — ACETAMINOPHEN 650 MG RE SUPP: 650.0000 mg | Freq: Four times a day (QID) | RECTAL | Status: DC | PRN

## 2021-06-04 MED ORDER — MORPHINE SULFATE (PF) 4 MG/ML IV SOLN
4.0000 mg | Freq: Once | INTRAVENOUS | Status: AC
  Administered 2021-06-04: 4 mg via INTRAVENOUS
  Filled 2021-06-04: qty 1

## 2021-06-04 MED ORDER — MAGNESIUM HYDROXIDE 400 MG/5ML PO SUSP
30.0000 mL | Freq: Every day | ORAL | Status: DC | PRN
  Filled 2021-06-04: qty 30

## 2021-06-04 MED ORDER — ONDANSETRON HCL 4 MG PO TABS: 4.0000 mg | ORAL_TABLET | Freq: Four times a day (QID) | ORAL | Status: DC | PRN

## 2021-06-04 MED ORDER — ASCORBIC ACID 500 MG PO TABS
1000.0000 mg | ORAL_TABLET | Freq: Every day | ORAL | Status: DC
  Administered 2021-06-05 – 2021-06-10 (×6): 1000 mg via ORAL
  Filled 2021-06-04 (×6): qty 2

## 2021-06-04 MED ORDER — MEGESTROL ACETATE 40 MG PO TABS
40.0000 mg | ORAL_TABLET | Freq: Two times a day (BID) | ORAL | Status: DC
  Filled 2021-06-04: qty 1

## 2021-06-04 MED ORDER — ALBUTEROL SULFATE (2.5 MG/3ML) 0.083% IN NEBU: 2.5000 mg | INHALATION_SOLUTION | RESPIRATORY_TRACT | Status: DC | PRN

## 2021-06-04 MED ORDER — ACETAMINOPHEN 325 MG PO TABS
650.0000 mg | ORAL_TABLET | Freq: Four times a day (QID) | ORAL | Status: DC | PRN
  Administered 2021-06-05 – 2021-06-06 (×4): 650 mg via ORAL
  Filled 2021-06-04 (×4): qty 2

## 2021-06-04 MED ORDER — TRAZODONE HCL 50 MG PO TABS
25.0000 mg | ORAL_TABLET | Freq: Every evening | ORAL | Status: DC | PRN
  Administered 2021-06-05 – 2021-06-09 (×4): 25 mg via ORAL
  Filled 2021-06-04 (×4): qty 1

## 2021-06-04 MED ORDER — ONDANSETRON HCL 4 MG/2ML IJ SOLN
4.0000 mg | Freq: Once | INTRAMUSCULAR | Status: AC
  Administered 2021-06-04: 4 mg via INTRAVENOUS
  Filled 2021-06-04: qty 2

## 2021-06-04 NOTE — ED Provider Notes (Incomplete)
[facility] EMERGENCY DEPARTMENT Provider Note   CSN: [id number] Arrival date & time: 06/04/21  1757     History Chief Complaint  Patient presents with   Vaginal Bleeding    Renee Paul is a 36 y.o. female presenting for worsening abdominal bleeding after her IUD fell out today.  Patient was seen on 3/2 for hysteroscopy for prolonged vaginal bleeding for the last year and an IUD was placed to help control her bleeding.  Patient was then seen on 3/11 because of vaginal discharge became very heavy and her OB/GYN told her to come to the ED to be assessed.  At that time the IUD was seem to be out of place and she was discharged home.  Patient reports this IUD fell that she is having extreme abdominal cramping and feels like she is delivering a child.  She reports many clots in her vaginal discharge.  Patient has not been taking furosemide due to her weakness and inability to get up to go to the bathroom.  She does report worsening shortness of breath.   Vaginal Bleeding Quality:  Bright red and heavier than menses Severity:  Moderate Onset quality:  Gradual Timing:  Constant     Home Medications Prior to Admission medications   Medication Sig Start Date End Date Taking? Authorizing Provider  albuterol (PROVENTIL HFA;VENTOLIN HFA) 108 (90 Base) MCG/ACT inhaler Inhale 2 puffs into the lungs every 4 (four) hours as needed for wheezing or shortness of breath. 09/06/16   Orpah Greek, MD  Ascorbic Acid (VITAMIN C) 1000 MG tablet Take 1,000 mg by mouth daily. 09/30/20   [provider]  ferrous sulfate 325 (65 FE) MG tablet Take 1 tablet (325 mg total) by mouth daily with breakfast. 03/23/21 04/22/21  Barb Merino, MD  furosemide (LASIX) 40 MG tablet Take 80 mg by mouth daily. 08/26/19   [provider]  ibuprofen (ADVIL) 800 MG tablet Take 800 mg by mouth every 6 (six) hours as needed for pain.    [provider]  medroxyPROGESTERone  (PROVERA) 10 MG tablet Take 10 mg by mouth 2 (two) times daily. X 30 days 03/05/21   [provider]      Allergies    Patient has no known allergies.    Review of Systems   Review of Systems  Genitourinary:  Positive for vaginal bleeding.   Physical Exam Updated Vital Signs BP (!) 150/98    Pulse 98    Temp 98.5 F (36.9 C)    Resp 17    SpO2 91%  Physical Exam Vitals and nursing note reviewed.  Constitutional:      General: She is in acute distress.     Appearance: She is obese.  Cardiovascular:     Rate and Rhythm: Normal rate and regular rhythm.  Pulmonary:     Effort: Respiratory distress present.     Comments: Tachypnea Increased WOB Abdominal:     Tenderness: There is abdominal tenderness. There is no guarding or rebound.     Comments: TTP of LLQ and RLQ  Skin:    General: Skin is warm and dry.  Neurological:     Mental Status: She is alert and oriented to person, place, and time.  Psychiatric:        Mood and Affect: Mood normal.        Behavior: Behavior normal.    ED Results / Procedures / Treatments   Labs (all labs ordered are listed, but only  abnormal results are displayed) Labs Reviewed  CBC WITH DIFFERENTIAL/PLATELET - Abnormal; Notable for the following components:      Result Value   Hemoglobin 11.2 (*)    MCHC 27.9 (*)    RDW 18.7 (*)    All other components within normal limits  COMPREHENSIVE METABOLIC PANEL - Abnormal; Notable for the following components:   Potassium 5.9 (*)    Calcium 8.3 (*)    Albumin 3.3 (*)    Total Bilirubin 2.4 (*)    All other components within normal limits  I-STAT CHEM 8, ED - Abnormal; Notable for the following components:   Calcium, Ion 1.01 (*)    All other components within normal limits  WET PREP, GENITAL  BRAIN NATRIURETIC PEPTIDE  URINALYSIS, ROUTINE W REFLEX MICROSCOPIC  I-STAT BETA HCG BLOOD, ED ([facility], [facility], AP ONLY)  TROPONIN I (HIGH SENSITIVITY)  TROPONIN I (HIGH SENSITIVITY)     EKG None  Radiology DG Chest Portable 1 View  Result Date: 06/04/2021 CLINICAL DATA:  Shortness of breath EXAM: PORTABLE CHEST 1 VIEW COMPARISON:  03/21/2021 FINDINGS: Underpenetrated AP portable examination. Cardiomegaly. Bandlike scarring and or atelectasis without acute appearing airspace opacity. The visualized skeletal structures are unremarkable. IMPRESSION: Cardiomegaly. Bandlike scarring and or atelectasis without acute appearing airspace opacity on underpenetrated AP portable examination. Electronically Signed   By: Delanna Ahmadi M.D.   On: 06/04/2021 20:15   US PELVIC COMPLETE WITH TRANSVAGINAL  Result Date: 06/04/2021 CLINICAL DATA:  Pelvic cramping.  History of fibroids. EXAM: TRANSABDOMINAL AND TRANSVAGINAL ULTRASOUND OF PELVIS TECHNIQUE: Both transabdominal and transvaginal ultrasound examinations of the pelvis were performed. Transabdominal technique was performed for global imaging of the pelvis including uterus, ovaries, adnexal regions, and pelvic cul-de-sac. It was necessary to proceed with endovaginal exam following the transabdominal exam to visualize the ovaries and endometrium. COMPARISON:  06/02/2021 FINDINGS: Uterus Measurements: 12.5 x 8.4 x 8.7 cm = volume: 914 mL. Uterus is anteverted. Multiple uterine myometrial lesions are demonstrated consistent with fibroids. Largest measures 3.7 cm in maximal diameter. Small nabothian cysts in the cervix. Endometrium Thickness: 18 mm.  No focal abnormality visualized. Right ovary Measurements: 4.5 x 2 x 2.5 cm = volume: 12 mL. Normal appearance/no adnexal mass. Left ovary Measurements: 4.3 x 2.3 x 2.6 cm = volume: 13 mL. Normal appearance/no adnexal mass. Other findings Flow is demonstrated in both ovaries on color flow Doppler imaging. No free fluid. Note that the examination is somewhat technically limited. Transvaginal images are mostly obscured due to bowel gas. IMPRESSION: 1. Multiple uterine fibroids, largest measuring 3.7 cm in  diameter. No change. 2. Thickened endometrium at 18 mm. The finding of intrauterine device seen previously in the lower uterine segment is not identified today but this area is not as well visualized. Possibly not seen due to artifact or removed in the interval. Clinical correlation suggested. Endometrial thickness is considered abnormal. Consider follow-up by [place] in 6-8 weeks, during the week immediately following menses (exam timing is critical). 3. Normal assigned appearance of the ovaries. Electronically Signed   By: Lucienne Capers M.D.   On: 06/04/2021 21:25    Procedures Procedures   Medications Ordered in ED Medications  megestrol (MEGACE) tablet 40 mg (has no administration in time range)  morphine (PF) 4 MG/ML injection 4 mg (4 mg Intravenous Given 06/04/21 2236)  ondansetron (ZOFRAN) injection 4 mg (4 mg Intravenous Given 06/04/21 2236)    ED Course/ Medical Decision Making/ A&P  Medical Decision Making Amount and/or Complexity of Data Reviewed Labs: ordered. Radiology: ordered.  Risk Prescription drug management.   ***   Spoke with OB/GYN who would like patient to be initiated on Megace 40 mg twice daily for her vaginal bleeding while admitted and recommending she follow-up with her primary OB/GYN after discharge and her respiratory status has been optimized.  Seeking admission for hypoxia and increased work of breathing.  Patient has a new oxygen requirement of 2 L.  Suspect likely volume increase due to not being on a furosemide for weeks given her vaginal bleeding.   {Document critical care time when appropriate:1} {Document review of labs and clinical decision tools ie heart score, Chads2Vasc2 etc:1}  {Document your independent review of radiology images, and any outside records:1} {Document your discussion with family members, caretakers, and with consultants:1} {Document social determinants of health affecting pt's care:1} {Document your  decision making why or why not admission, treatments were needed:1} Final Clinical Impression(s) / ED Diagnoses Final diagnoses:  None    Rx / DC Orders ED Discharge Orders     None

## 2021-06-04 NOTE — ED Provider Triage Note (Signed)
Emergency Medicine Provider Triage Evaluation Note ? ?Renee Paul , a 36 y.o. female  was evaluated in triage.  Pt complains of vaginal bleeding. She had a hysteroscopy on 3/2 . She has had vaginal bleeding since then. She was seen at another ED and was told she had a malpositioned IUD. She was told to f/u with her OBGYN. She states the next day her IUD came out and she has had continued heavy vaginal bleeding and pain since then  ? ?Review of Systems  ?Positive: Vaginal bleeding, pelvic pain ?Negative: Vaginal discharge, fever ? ?Physical Exam  ?There were no vitals taken for this visit. ?Gen:   Awake, no distress   ?Resp:  Normal effort  ?MSK:   Moves extremities without difficulty  ?Other:  Tearful, ttp to the pelvic area bilat ? ?Medical Decision Making  ?Medically screening exam initiated at 6:02 PM.  Appropriate orders placed.  Renee Paul was informed that the remainder of the evaluation will be completed by another provider, this initial triage assessment does not replace that evaluation, and the importance of remaining in the ED until their evaluation is complete. ? ? ?  ?Rodney Booze, PA-C ?06/04/21 1804 ? ?

## 2021-06-04 NOTE — ED Notes (Signed)
Pelvic cart at bedside. 

## 2021-06-04 NOTE — ED Triage Notes (Addendum)
Patient BIB [facility] for vaginal bleeding that increased today, hysteroscopy on 3/2 to evaluate vaginal bleeding. Patient states bleeding has increased such that she is using washcloth instead of sanitary pads to catch blood. Patient also complains of abdominal cramping. History of CHF, has not taken furosemide in several weeks. Patient is anxious, alert, and oriented. ? ?EMS Vitals ?172/88 ?HR 72 ?97% on room air ?

## 2021-06-05 ENCOUNTER — Inpatient Hospital Stay (HOSPITAL_COMMUNITY): Payer: No Typology Code available for payment source

## 2021-06-05 DIAGNOSIS — J9601 Acute respiratory failure with hypoxia: Secondary | ICD-10-CM | POA: Diagnosis not present

## 2021-06-05 DIAGNOSIS — N939 Abnormal uterine and vaginal bleeding, unspecified: Secondary | ICD-10-CM

## 2021-06-05 DIAGNOSIS — G4733 Obstructive sleep apnea (adult) (pediatric): Secondary | ICD-10-CM

## 2021-06-05 DIAGNOSIS — I5033 Acute on chronic diastolic (congestive) heart failure: Secondary | ICD-10-CM | POA: Diagnosis not present

## 2021-06-05 DIAGNOSIS — I5031 Acute diastolic (congestive) heart failure: Secondary | ICD-10-CM

## 2021-06-05 DIAGNOSIS — Z6841 Body Mass Index (BMI) 40.0 and over, adult: Secondary | ICD-10-CM

## 2021-06-05 LAB — ECHOCARDIOGRAM COMPLETE
AR max vel: 2.97 cm2
AV Area VTI: 2.62 cm2
AV Area mean vel: 2.77 cm2
AV Mean grad: 5 mmHg
AV Peak grad: 9.1 mmHg
Ao pk vel: 1.51 m/s
Height: 65 in
S' Lateral: 2.9 cm
Weight: 7590.4 oz

## 2021-06-05 LAB — BASIC METABOLIC PANEL
Anion gap: 6 (ref 5–15)
BUN: 7 mg/dL (ref 6–20)
CO2: 33 mmol/L — ABNORMAL HIGH (ref 22–32)
Calcium: 8.4 mg/dL — ABNORMAL LOW (ref 8.9–10.3)
Chloride: 100 mmol/L (ref 98–111)
Creatinine, Ser: 0.93 mg/dL (ref 0.44–1.00)
GFR, Estimated: >60 mL/min (ref >60)
Glucose, Bld: 133 mg/dL — ABNORMAL HIGH (ref 70–99)
Potassium: 4.3 mmol/L (ref 3.5–5.1)
Sodium: 139 mmol/L (ref 135–145)

## 2021-06-05 LAB — URINALYSIS, ROUTINE W REFLEX MICROSCOPIC
Bacteria, UA: NONE SEEN
Bilirubin Urine: NEGATIVE
Glucose, UA: NEGATIVE mg/dL
Ketones, ur: NEGATIVE mg/dL
Leukocytes,Ua: NEGATIVE
Nitrite: NEGATIVE
Protein, ur: NEGATIVE mg/dL
Specific Gravity, Urine: 1.014 (ref 1.005–1.030)
pH: 7 (ref 5.0–8.0)

## 2021-06-05 LAB — RESP PANEL BY RT-PCR (FLU A&B, COVID) ARPGX2
Influenza A by PCR: NEGATIVE
Influenza B by PCR: NEGATIVE
SARS Coronavirus 2 by RT PCR: NEGATIVE

## 2021-06-05 LAB — TSH: TSH: 5.785 u[IU]/mL — ABNORMAL HIGH (ref 0.350–4.500)

## 2021-06-05 LAB — CBC
HCT: 38.4 % (ref 36.0–46.0)
Hemoglobin: 10.9 g/dL — ABNORMAL LOW (ref 12.0–15.0)
MCH: 27.1 pg (ref 26.0–34.0)
MCHC: 28.4 g/dL — ABNORMAL LOW (ref 30.0–36.0)
MCV: 95.5 fL (ref 80.0–100.0)
Platelets: 271 10*3/uL (ref 150–400)
RBC: 4.02 MIL/uL (ref 3.87–5.11)
RDW: 18.5 % — ABNORMAL HIGH (ref 11.5–15.5)
WBC: 5.8 10*3/uL (ref 4.0–10.5)
nRBC: 0 % (ref 0.0–0.2)

## 2021-06-05 MED ORDER — PERFLUTREN LIPID MICROSPHERE
1.0000 mL | INTRAVENOUS | Status: AC | PRN
  Administered 2021-06-05: 7 mL via INTRAVENOUS
  Filled 2021-06-05: qty 10

## 2021-06-05 MED ORDER — MEGESTROL ACETATE 40 MG PO TABS
40.0000 mg | ORAL_TABLET | Freq: Two times a day (BID) | ORAL | Status: DC
  Administered 2021-06-05 – 2021-06-10 (×11): 40 mg via ORAL
  Filled 2021-06-05 (×13): qty 1

## 2021-06-05 NOTE — Progress Notes (Signed)
Echocardiogram ?2D Echocardiogram has been performed. ? ?Renee Paul ?06/05/2021, 1:53 PM ?

## 2021-06-05 NOTE — Assessment & Plan Note (Addendum)
Declines CPAP. Has definite nocturnal desaturations. ?

## 2021-06-05 NOTE — H&P (Signed)
?  ?  ?[place] ? ? ?PATIENT NAME: Renee Paul   ? ?MR#:  [id number] ? ?DATE OF BIRTH:  05/11/1985 ? ?DATE OF ADMISSION:  06/04/2021 ? ?PRIMARY CARE PHYSICIAN: [facility] [place]  ? ?Patient is coming from: Home ? ?REQUESTING/REFERRING PHYSICIAN: Leodis Liverpool, MD/, alert ? ?Alert yes hi and thank you for coming back consult body ?The patient ? ?CHIEF COMPLAINT:  ? ?Chief Complaint  ?Patient presents with  ?? Vaginal Bleeding  ?-Shortness of breath ? ?HISTORY OF PRESENT ILLNESS:  ?Renee Paul is a 36 y.o. African-American female with medical history significant for asthma, CHF and obesity, who presented to the emergency room with acute onset of increased vaginal bleeding after her IUD fell out today.  The patient had a hysteroscopy on 3/2 by her GYN doctor at [facility] and had an IUD inserted as she has been having bleeding for almost a year.  She was seen in at [facility] on Saturday night for vaginal discharge and an abdominal and pelvic CT scan done revealed that her IUD was out of place.  She was advised to follow-up with a GYN physician and next morning her IUD fell.  She denies any nausea or vomiting or abdominal pain.  She admits to cough as well as occasional wheezing and dyspnea with associated orthopnea, dyspnea on exertion and paroxysmal nocturnal dyspnea with lower extremity edema.  She stopped taking her Lasix about a week ago as she did not want to run frequently to the bathroom.  No chest pain or palpitations.  No dysuria, urinary frequency or urgency or flank pain.  No fever or chills.   ? ?ED Course: Upon presentation to the emergency room, BP was 148/97 with heart rate of 102 and respiratory rate of 22 with pulse symmetry of 77% on room air and 94-98 % on 3 days of O2 by nasal cannula.  Labs revealed hemoglobin of 11.2 and hematocrit 40.1 compared to 12.9 and 38 earlier and BMP was unremarkable.  Influenza antigens and COVID-19 PCR came  back negative.  Her wet prep was negative. ?EKG as reviewed by me : EKG showed normal sinus rhythm with a rate of 97 with right atrial enlargement. ?Imaging: Chest x-ray showed cardiomegaly and bandlike scarring or atelectasis without acute appearing airspace disease. ? ?Contact was made with GYN on-call and recommendation was for Megace 40 mg p.o. twice daily.  The patient was given 4 mg of IV morphine sulfate and 4 mg of IV Zofran.  She will be admitted to a cardiac telemetry bed for further evaluation and management. ?PAST MEDICAL HISTORY:  ? ?Past Medical History:  ?Diagnosis Date  ?? Asthma   ?? CHF (congestive heart failure) ([place])   ?? Morbid obesity ([place])   ? ? ?PAST SURGICAL HISTORY:  ? ?Past Surgical History:  ?Procedure Laterality Date  ?? CESAREAN SECTION    ?? CESAREAN SECTION    ?? CHOLECYSTECTOMY    ?? INTRAUTERINE DEVICE INSERTION    ?? IUD REMOVAL    ?? TUBAL LIGATION    ? ? ?SOCIAL HISTORY:  ? ?Social History  ? ?Tobacco Use  ?? Smoking status: Every Day  ?  Packs/day: 0.25  ?  Types: Cigarettes  ?? Smokeless tobacco: Never  ?Substance Use Topics  ?? Alcohol use: Yes  ?  Alcohol/week: 1.0 standard drink  ?  Types: 1 Cans of beer per week  ?  Comment: occ  ? ? ?FAMILY HISTORY:  ?No family  history on file.  No pertinent family history. ? ?DRUG ALLERGIES:  ?No Known Allergies ? ?REVIEW OF SYSTEMS:  ? ?ROS ?As per history of present illness. All pertinent systems were reviewed above. Constitutional, HEENT, cardiovascular, respiratory, GI, GU, musculoskeletal, neuro, psychiatric, endocrine, integumentary and hematologic systems were reviewed and are otherwise negative/unremarkable except for positive findings mentioned above in the HPI. ? ? ?MEDICATIONS AT HOME:  ? ?Prior to Admission medications   ?Medication Sig Start Date End Date Taking? Authorizing Provider  ?albuterol (PROVENTIL HFA;VENTOLIN HFA) 108 (90 Base) MCG/ACT inhaler Inhale 2 puffs into the lungs every 4 (four) hours as needed for wheezing  or shortness of breath. 09/06/16  Yes Pollina, Gwenyth Allegra, MD  ?Ascorbic Acid (VITAMIN C) 1000 MG tablet Take 1,000 mg by mouth daily. 09/30/20  Yes [provider]  ?furosemide (LASIX) 40 MG tablet Take 80 mg by mouth daily. 08/26/19  Yes [provider]  ?ferrous sulfate 325 (65 FE) MG tablet Take 1 tablet (325 mg total) by mouth daily with breakfast. 03/23/21 04/22/21  Barb Merino, MD  ? ?  ? ?VITAL SIGNS:  ?Blood pressure 140/79, pulse 78, temperature 98.2 ?F (36.8 ?C), temperature source Oral, resp. rate 16, height '5\' 5"'$  (1.651 m), weight (!) 215.2 kg, SpO2 94 %. ? ?PHYSICAL EXAMINATION:  ?Physical Exam ? ?GENERAL:  36 y.o.-year-old obese African-American female patient lying in the bed with no acute distress.  ?EYES: Pupils equal, round, reactive to light and accommodation. No scleral icterus. Extraocular muscles intact.  ?HEENT: Head atraumatic, normocephalic. Oropharynx and nasopharynx clear.  ?NECK:  Supple, no jugular venous distention. No thyroid enlargement, no tenderness.  ?LUNGS: Diminished bibasilar breath sounds with mild bibasal rales. No use of accessory muscles of respiration.  ?CARDIOVASCULAR: Regular rate and rhythm, S1, S2 normal. No murmurs, rubs, or gallops.  ?ABDOMEN: Soft, nondistended, nontender. Bowel sounds present. No organomegaly or mass.  ?EXTREMITIES: 1+ bilateral pitting lower extremity edema, with no cyanosis, or clubbing.  ?NEUROLOGIC: Cranial nerves II through XII are intact. Muscle strength 5/5 in all extremities. Sensation intact. Gait not checked.  ?PSYCHIATRIC: The patient is alert and oriented x 3.  Normal affect and good eye contact. ?SKIN: No obvious rash, lesion, or ulcer.  ? ?LABORATORY PANEL:  ? ?CBC ?Recent Labs  ?Lab 06/04/21 ?2221  ?WBC 5.4  ?HGB 11.2*  ?HCT 40.1  ?PLT 264  ? ?------------------------------------------------------------------------------------------------------------------ ? ?Chemistries  ?Recent Labs  ?Lab 06/04/21 ?2155  06/04/21 ?2220  ?NA 136 139  ?K 5.9* 4.4  ?CL 102 100  ?CO2 23  --   ?GLUCOSE 74 77  ?BUN 10 9  ?CREATININE 0.79 0.80  ?CALCIUM 8.3*  --   ?AST 40  --   ?ALT 12  --   ?ALKPHOS 47  --   ?BILITOT 2.4*  --   ? ?------------------------------------------------------------------------------------------------------------------ ? ?Cardiac Enzymes ?No results for input(s): TROPONINI in the last 168 hours. ?------------------------------------------------------------------------------------------------------------------ ? ?RADIOLOGY:  ?DG Chest Portable 1 View ? ?Result Date: 06/04/2021 ?CLINICAL DATA:  Shortness of breath EXAM: PORTABLE CHEST 1 VIEW COMPARISON:  03/21/2021 FINDINGS: Underpenetrated AP portable examination. Cardiomegaly. Bandlike scarring and or atelectasis without acute appearing airspace opacity. The visualized skeletal structures are unremarkable. IMPRESSION: Cardiomegaly. Bandlike scarring and or atelectasis without acute appearing airspace opacity on underpenetrated AP portable examination. Electronically Signed   By: Delanna Ahmadi M.D.   On: 06/04/2021 20:15  ? ?US PELVIC COMPLETE WITH TRANSVAGINAL ? ?Result Date: 06/04/2021 ?CLINICAL DATA:  Pelvic cramping.  History of fibroids. EXAM: TRANSABDOMINAL AND  TRANSVAGINAL ULTRASOUND OF PELVIS TECHNIQUE: Both transabdominal and transvaginal ultrasound examinations of the pelvis were performed. Transabdominal technique was performed for global imaging of the pelvis including uterus, ovaries, adnexal regions, and pelvic cul-de-sac. It was necessary to proceed with endovaginal exam following the transabdominal exam to visualize the ovaries and endometrium. COMPARISON:  06/02/2021 FINDINGS: Uterus Measurements: 12.5 x 8.4 x 8.7 cm = volume: 914 mL. Uterus is anteverted. Multiple uterine myometrial lesions are demonstrated consistent with fibroids. Largest measures 3.7 cm in maximal diameter. Small nabothian cysts in the cervix. Endometrium Thickness: 18 mm.  No  focal abnormality visualized. Right ovary Measurements: 4.5 x 2 x 2.5 cm = volume: 12 mL. Normal appearance/no adnexal mass. Left ovary Measurements: 4.3 x 2.3 x 2.6 cm = volume: 13 mL. Normal appearance/no adnex

## 2021-06-05 NOTE — Plan of Care (Signed)

## 2021-06-05 NOTE — Assessment & Plan Note (Signed)
-   No current exacerbation. ?- Albuterol nebulizer as needed. ?

## 2021-06-05 NOTE — Progress Notes (Signed)
Heart Failure Navigator Progress Note ? ?Following this hospitalization to assess for HV TOC readiness.  ? ?Will continue to follow if they continue with a cardiac work up, will consider for [place]..  ? ? ?Earnestine Leys, BSN, RN ?Heart Failure Nurse Navigator ?[phone]  ?

## 2021-06-05 NOTE — Consult Note (Signed)
[facility]ne[facility] Attending Consultation Note ? ?Renee Paul is a 36 y.o. [id number] who presented to the ER for evaluation of heavy vaginal bleeding.  ? ?I reviewed all of the following: ?Multiple notes from her care at [facility] - her operative note in March, her gyn/onc note from their visit prior to her surgery and the telephone encounters from the last week.  ?I have reviewed her imaging report from this hospital admission.  ? ?Historically, the patient confirms that she started having AUB about 6-8 months ago. She had multiple attempts in the her primary GYN's office for EMB but they were unable to get a sample. She then was referred to gyn/onc office, Dr. Gwenlyn Found. She went to the OR on 3/2 and had D&C, hysteroscopy and Mirena IUD insertion.  By 3/9 she had ongoing bleeding and clots and was directed to go to the ED. She was unable to go due to child care concerns. She called again on 3/11 and they recommended evaluation in the ED which she did go to. There she had a CT and TVUS both which showed the IUD likely in her vagina. She was sent home but not started on any medications at that time for her bleeding. On 3/13 she called Dr. Kennon Holter office regarding her HVB and IUD expulsion - they recommended follow up in the ED at [facility]. She did not go to [facility] ultimately because this hospital was closer.  ? ?She had thoughts of suicide when her bleeding was heaviest prior to presentation to the ER - she noted this in the phone call but also she reported this to me. She did not have a plan and if she had not been admitted for her bleeding was going to request to be committed for these thoughts. She states she wouldn't feel this way if the bleeding is improved.  ? 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 She does not have the ability to deal with other peoples problems at this time. Socially, she reports to me she drinks one 24 oz beer daily but a couple days a  week she drinks TWO 24 oz beers. Sometimes she has a glass of wine instead of beer. She also notes she is living out of a hotel with her children.  ? ? ?Past Medical History:  ?Diagnosis Date  ? Asthma   ? CHF (congestive heart failure) ([place])   ? Morbid obesity ([place])   ? ?Past Surgical History:  ?Procedure Laterality Date  ? CESAREAN SECTION    ? CESAREAN SECTION    ? CHOLECYSTECTOMY    ? INTRAUTERINE DEVICE INSERTION    ? IUD REMOVAL    ? TUBAL LIGATION    ? ?OB History  ?Gravida Para Term Preterm AB Living  ?'3 2 2 '$ 0 0 2  ?SAB IAB Ectopic Multiple Live Births  ?0 0 0 0    ?  ?# Outcome Date GA Lbr Len/2nd Weight Sex Delivery Anes PTL Lv  ?3 Term           ?2 Term           ?1 Gravida           ?   Birth Comments: System Generated. Please review and update pregnancy details.  ?Patient denies any other pertinent gynecologic issues.  ?No current facility-administered medications on file prior to encounter.  ? ?Current Outpatient Medications on File Prior to Encounter  ?Medication Sig Dispense Refill  ?  albuterol (PROVENTIL HFA;VENTOLIN HFA) 108 (90 Base) MCG/ACT inhaler Inhale 2 puffs into the lungs every 4 (four) hours as needed for wheezing or shortness of breath. 1 Inhaler 2  ? Ascorbic Acid (VITAMIN C) 1000 MG tablet Take 1,000 mg by mouth daily.    ? furosemide (LASIX) 40 MG tablet Take 80 mg by mouth daily.    ? ferrous sulfate 325 (65 FE) MG tablet Take 1 tablet (325 mg total) by mouth daily with breakfast. 30 tablet 0  ? ?No Known Allergies ? ?Social History:   reports that she has been smoking cigarettes. She has been smoking an average of .25 packs per day. She has never used smokeless tobacco. She reports current alcohol use of about 1.0 standard drink per week. She reports that she does not use drugs. ?No family history on file. (See details above with updated drinking) ? ?Review of Systems: Pertinent items noted in HPI and remainder of comprehensive ROS otherwise negative. ? ?PHYSICAL EXAM: ?Blood  pressure 114/75, pulse 79, temperature 98.2 ?F (36.8 ?C), temperature source Oral, resp. rate 18, height '5\' 5"'$  (1.651 m), weight (!) 215.2 kg, SpO2 95 %. ?CONSTITUTIONAL: Well-developed, well-nourished female in no acute distress.  ?HENT:  Normocephalic, atraumatic, External right and left ear normal. Oropharynx is clear and moist ?EYES: Conjunctivae and EOM are normal. Pupils are equal, round, and reactive to light. No scleral icterus.  ?NECK: Normal range of motion, supple, no masses ?SKIN: Skin is warm and dry. No rash noted. Not diaphoretic. No erythema. No pallor. ?NEUROLOGIC: Alert and oriented to person, place, and time. Normal reflexes, muscle tone coordination. No cranial nerve deficit noted. ?PSYCHIATRIC: Normal mood and affect. Normal behavior. Normal judgment and thought content. ?CARDIOVASCULAR: Normal heart rate noted, regular rhythm ?RESPIRATORY: Effort and breath sounds normal, no problems with respiration noted ?ABDOMEN: Soft, nontender, nondistended. ?PELVIC: Normal appearing external genitalia; no blood on pad. Prior to inspection, pt provided consent for me to examine.  ?MUSCULOSKELETAL: Normal range of motion. No tenderness.  No cyanosis, clubbing, or edema.  2+ distal pulses. ? ?Labs: ?Results for orders placed or performed during the hospital encounter of 06/04/21 (from the past 336 hour(s))  ?I-Stat beta hCG blood, ED  ? Collection Time: 06/04/21  9:48 PM  ?Result Value Ref Range  ? I-stat hCG, quantitative <5.0 <5 mIU/mL  ? Comment 3          ?Troponin I (High Sensitivity)  ? Collection Time: 06/04/21  9:53 PM  ?Result Value Ref Range  ? Troponin I (High Sensitivity) 5 <18 ng/L  ?Comprehensive metabolic panel  ? Collection Time: 06/04/21  9:55 PM  ?Result Value Ref Range  ? Sodium 136 135 - 145 mmol/L  ? Potassium 5.9 (H) 3.5 - 5.1 mmol/L  ? Chloride 102 98 - 111 mmol/L  ? CO2 23 22 - 32 mmol/L  ? Glucose, Bld 74 70 - 99 mg/dL  ? BUN 10 6 - 20 mg/dL  ? Creatinine, Ser 0.79 0.44 - 1.00 mg/dL   ? Calcium 8.3 (L) 8.9 - 10.3 mg/dL  ? Total Protein 7.5 6.5 - 8.1 g/dL  ? Albumin 3.3 (L) 3.5 - 5.0 g/dL  ? AST 40 15 - 41 U/L  ? ALT 12 0 - 44 U/L  ? Alkaline Phosphatase 47 38 - 126 U/L  ? Total Bilirubin 2.4 (H) 0.3 - 1.2 mg/dL  ? GFR, Estimated >60 >60 mL/min  ? Anion gap 11 5 - 15  ?I-stat chem 8, ED (not at [facility]  or [place])  ? Collection Time: 06/04/21 10:20 PM  ?Result Value Ref Range  ? Sodium 139 135 - 145 mmol/L  ? Potassium 4.4 3.5 - 5.1 mmol/L  ? Chloride 100 98 - 111 mmol/L  ? BUN 9 6 - 20 mg/dL  ? Creatinine, Ser 0.80 0.44 - 1.00 mg/dL  ? Glucose, Bld 77 70 - 99 mg/dL  ? Calcium, Ion 1.01 (L) 1.15 - 1.40 mmol/L  ? TCO2 31 22 - 32 mmol/L  ? Hemoglobin 12.9 12.0 - 15.0 g/dL  ? HCT 38.0 36.0 - 46.0 %  ?CBC with Differential  ? Collection Time: 06/04/21 10:21 PM  ?Result Value Ref Range  ? WBC 5.4 4.0 - 10.5 K/uL  ? RBC 4.12 3.87 - 5.11 MIL/uL  ? Hemoglobin 11.2 (L) 12.0 - 15.0 g/dL  ? HCT 40.1 36.0 - 46.0 %  ? MCV 97.3 80.0 - 100.0 fL  ? MCH 27.2 26.0 - 34.0 pg  ? MCHC 27.9 (L) 30.0 - 36.0 g/dL  ? RDW 18.7 (H) 11.5 - 15.5 %  ? Platelets 264 150 - 400 K/uL  ? nRBC 0.0 0.0 - 0.2 %  ? Neutrophils Relative % 69 %  ? Neutro Abs 3.7 1.7 - 7.7 K/uL  ? Lymphocytes Relative 23 %  ? Lymphs Abs 1.3 0.7 - 4.0 K/uL  ? Monocytes Relative 5 %  ? Monocytes Absolute 0.3 0.1 - 1.0 K/uL  ? Eosinophils Relative 2 %  ? Eosinophils Absolute 0.1 0.0 - 0.5 K/uL  ? Basophils Relative 0 %  ? Basophils Absolute 0.0 0.0 - 0.1 K/uL  ? Immature Granulocytes 1 %  ? Abs Immature Granulocytes 0.03 0.00 - 0.07 K/uL  ?Brain natriuretic peptide  ? Collection Time: 06/04/21 10:21 PM  ?Result Value Ref Range  ? B Natriuretic Peptide 50.0 0.0 - 100.0 pg/mL  ?Troponin I (High Sensitivity)  ? Collection Time: 06/04/21 10:21 PM  ?Result Value Ref Range  ? Troponin I (High Sensitivity) 5 <18 ng/L  ?Wet prep, genital  ? Collection Time: 06/04/21 10:25 PM  ? Specimen: Nasopharyngeal Swab  ?Result Value Ref Range  ? Yeast Wet Prep HPF POC NONE SEEN NONE  SEEN  ? Trich, Wet Prep NONE SEEN NONE SEEN  ? Clue Cells Wet Prep HPF POC NONE SEEN NONE SEEN  ? WBC, Wet Prep HPF POC <10 <10  ? Sperm NONE SEEN   ?Resp Panel by RT-PCR (Flu A&B, Covid) Nasopharyngeal Swab

## 2021-06-05 NOTE — Assessment & Plan Note (Addendum)
Bleeding improving/decreasing. ?- Continue megace '40mg'$  po BID at least until follow up with gyn as outpatient next week. OB/GYN consulted and evaluated the patient 3/14, 3/16. IUD may continue to be best option for the patient who is at very high periprocedural risk. ?

## 2021-06-05 NOTE — Assessment & Plan Note (Addendum)
Clinically volume status is difficult to determine, though weight continues steady decline 215kg > 213 > 210 > 206.7kg > 205kg. Echo limited due to habitus, though LVEF appeared preserved, IVC was dilated and blunted, unclear diastolic function.  ?- Cr stable, UOP another ~3L yesterday. Offered my recommendation for medical optimization would be continue IV lasix '80mg'$  TID, though pt opts for discharge. Will restart home '80mg'$  po daily, in hopes of increasing adherence, as a compromise between need to keep fluid off and pt's desire to minimize frequent trips to urinate.  ?- She is aware that oral daily diuretic is necessary to prevent readmission/worsening respiratory failure ?

## 2021-06-05 NOTE — Assessment & Plan Note (Addendum)
Wean oxygen as tolerated. Pt actually is supposed to use oxygen chronically, still requires oxygen with exertion. Despite education that not wearing oxygen when hypoxic, even if tolerable, can lead to pulmonary HTN, etc., she may end up declining this. ?

## 2021-06-05 NOTE — Progress Notes (Signed)
?PROGRESS NOTE ? ? ? ?Renee Paul  SPQ:[id number] DOB: [birth year] DOA: 06/04/2021 ?PCP: Medicine, [place] ? ? ?Brief Narrative:  ?Renee Paul is a 36 y.o. African-American female with medical history significant for asthma, CHF and obesity, who presented to the emergency room with acute onset of increased vaginal bleeding after her IUD fell out. The patient had a hysteroscopy on 3/2 by her GYN doctor at [facility] and had an IUD inserted as she has been having bleeding for almost a year. She was seen in at [facility] on Saturday night for vaginal discharge and an abdominal and pelvic CT scan done revealed that her IUD was out of place. ? ?She admits to cough as well as occasional wheezing and dyspnea with associated orthopnea, dyspnea on exertion and paroxysmal nocturnal dyspnea with lower extremity edema.  She stopped taking her Lasix about a week ago as she did not want to run frequently to the bathroom. ? ?Assessment & Plan: ?  ?Active Problems: ?  Acute on chronic diastolic CHF (congestive heart failure) ([place]) ?  Acute respiratory failure with hypoxia ([place]) ?  Vaginal bleeding ?  Asthma ?  OSA (obstructive sleep apnea) ? ?Acute on chronic diastolic CHF (congestive heart failure) ([place]) ?Acute hypoxic respiratory failure and volume overload ?- In the setting of medication noncompliancel ?- We will continue diuresis with IV Lasix. ?- Echo completed this afternoon, pending read by cardiology ?- Continue to wean oxygen as tolerated -likely underlying obesity hypoventilation syndrome given BMI of nearly 80 ?  ?Vaginal bleeding, subacute versus chronic ?- H&H continue to downtrend ?- Gynecology reconsulted this morning to follow along and manage IUD/active bleeding with chronic component she states with bleeding consistently for the past year recent hysteroscopy 05/24/2021 at [facility] ?- Recommendation by overnight coverage for Megace 40 twice daily ?   ?Asthma ?- No current exacerbation. ?- Albuterol nebulizer as needed. ?  ?Medication noncompliance ?-Profound, discussed at length at bedside need for ongoing compliance given her recent hospitalization is most certainly secondary to noncompliance with Lasix ? ?OSA (obstructive sleep apnea) ?- We will utilize CPAP nightly. ?  ?Morbid obesity ?-BMI nearly 80, lengthy discussion at bedside about need for lifestyle and dietary changes, unfortunately patient appears to have gained weight since last evaluation in December ?  ?DVT prophylaxis: SCDs. ?Code Status: Full ?Family Communication: None present ? ?Status is: Inpatient ? ?Dispo: The patient is from: Home ?             Anticipated d/c is to: To be determined ?             Anticipated d/c date is: 48 to 72 hours ?             Patient currently not medically stable for discharge ? ?Consultants:  ?OB/GYN ? ?Procedures:  ?None ? ?Antimicrobials:  ?None ? ?Subjective: ?No acute issues or events overnight, patient continues to complain of shortness of breath, orthopnea, generalized fatigue weakness; denies chest pain fevers chills nausea vomiting diarrhea constipation ? ?Objective: ?Vitals:  ? 06/05/21 0115 06/05/21 0200 06/05/21 0226 06/05/21 0227  ?BP: 124/68 109/60  140/79  ?Pulse: 67 75 75 78  ?Resp: '14 11 14 16  '$ ?Temp:  98.3 ?F (36.8 ?C)  98.2 ?F (36.8 ?C)  ?TempSrc:  Oral  Oral  ?SpO2: 100% 94% 93% 94%  ?Weight:   (!) 215.2 kg   ?Height:   '5\' 5"'$  (1.651 m)   ? ? ?Intake/Output Summary (Last  24 hours) at 06/05/2021 0805 ?Last data filed at 06/05/2021 0400 ?Gross per 24 hour  ?Intake 480 ml  ?Output 500 ml  ?Net -20 ml  ? ?Filed Weights  ? 06/05/21 0226  ?Weight: (!) 215.2 kg  ? ? ?Examination: ? ?General:  Pleasantly resting in bed, No acute distress. ?Lungs: Diminished breath sounds bilaterally. ?Heart:  Regular rate and rhythm.  Without murmurs, rubs, or gallops. ?Abdomen: Obese, nontender. ?Extremities: 2+ pitting edema. ? ?Data Reviewed: I have personally reviewed  following labs and imaging studies ? ?CBC: ?Recent Labs  ?Lab 06/04/21 ?2220 06/04/21 ?2221 06/05/21 ?7616  ?WBC  --  5.4 5.8  ?NEUTROABS  --  3.7  --   ?HGB 12.9 11.2* 10.9*  ?HCT 38.0 40.1 38.4  ?MCV  --  97.3 95.5  ?PLT  --  264 271  ? ?Basic Metabolic Panel: ?Recent Labs  ?Lab 06/04/21 ?2155 06/04/21 ?2220 06/05/21 ?0737  ?NA 136 139 139  ?K 5.9* 4.4 4.3  ?CL 102 100 100  ?CO2 23  --  33*  ?GLUCOSE 74 77 133*  ?BUN '10 9 7  '$ ?CREATININE 0.79 0.80 0.93  ?CALCIUM 8.3*  --  8.4*  ? ?GFR: ?Estimated Creatinine Clearance: 157.3 mL/min (by C-G formula based on SCr of 0.93 mg/dL). ?Liver Function Tests: ?Recent Labs  ?Lab 06/04/21 ?2155  ?AST 40  ?ALT 12  ?ALKPHOS 47  ?BILITOT 2.4*  ?PROT 7.5  ?ALBUMIN 3.3*  ? ?No results for input(s): LIPASE, AMYLASE in the last 168 hours. ?No results for input(s): AMMONIA in the last 168 hours. ?Coagulation Profile: ?No results for input(s): INR, PROTIME in the last 168 hours. ?Cardiac Enzymes: ?No results for input(s): CKTOTAL, CKMB, CKMBINDEX, TROPONINI in the last 168 hours. ?BNP (last 3 results) ?No results for input(s): PROBNP in the last 8760 hours. ?HbA1C: ?No results for input(s): HGBA1C in the last 72 hours. ?CBG: ?No results for input(s): GLUCAP in the last 168 hours. ?Lipid Profile: ?No results for input(s): CHOL, HDL, LDLCALC, TRIG, CHOLHDL, LDLDIRECT in the last 72 hours. ?Thyroid Function Tests: ?Recent Labs  ?  06/05/21 ?1062  ?TSH 5.785*  ? ?Anemia Panel: ?No results for input(s): VITAMINB12, FOLATE, FERRITIN, TIBC, IRON, RETICCTPCT in the last 72 hours. ?Sepsis Labs: ?No results for input(s): PROCALCITON, LATICACIDVEN in the last 168 hours. ? ?Recent Results (from the past 240 hour(s))  ?Wet prep, genital     Status: None  ? Collection Time: 06/04/21 10:25 PM  ? Specimen: Nasopharyngeal Swab  ?Result Value Ref Range Status  ? Yeast Wet Prep HPF POC NONE SEEN NONE SEEN Final  ? Trich, Wet Prep NONE SEEN NONE SEEN Final  ? Clue Cells Wet Prep HPF POC NONE SEEN NONE SEEN  Final  ? WBC, Wet Prep HPF POC <10 <10 Final  ? Sperm NONE SEEN  Final  ?  Comment: Performed at [facility], [address], [place], [place] [address]  ?Resp Panel by RT-PCR (Flu A&B, Covid) Nasopharyngeal Swab     Status: None  ? Collection Time: 06/05/21 12:11 AM  ? Specimen: Nasopharyngeal Swab; Nasopharyngeal(NP) swabs in vial transport medium  ?Result Value Ref Range Status  ? SARS Coronavirus 2 by RT PCR NEGATIVE NEGATIVE Final  ?  Comment: (NOTE) ?SARS-CoV-2 target nucleic acids are NOT DETECTED. ? ?The SARS-CoV-2 RNA is generally detectable in upper respiratory ?specimens during the acute phase of infection. The lowest ?concentration of SARS-CoV-2 viral copies this assay can detect is ?138 copies/mL. A negative result does not  preclude SARS-Cov-2 ?infection and should not be used as the sole basis for treatment or ?other patient management decisions. A negative result may occur with  ?improper specimen collection/handling, submission of specimen other ?than nasopharyngeal swab, presence of viral mutation(s) within the ?areas targeted by this assay, and inadequate number of viral ?copies(<138 copies/mL). A negative result must be combined with ?clinical observations, patient history, and epidemiological ?information. The expected result is Negative. ? ?Fact Sheet for Patients:  ?EntrepreneurPulse.com.au ? ?Fact Sheet for Healthcare Providers:  ?IncredibleEmployment.be ? ?This test is no t yet approved or cleared by the [place] FDA and  ?has been authorized for detection and/or diagnosis of SARS-CoV-2 by ?FDA under an Emergency Use Authorization (EUA). This EUA will remain  ?in effect (meaning this test can be used) for the duration of the ?COVID-19 declaration under Section 564(b)(1) of the Act, 21 ?U.S.C.section 360bbb-3(b)(1), unless the authorization is terminated  ?or revoked sooner.  ? ? ?  ? Influenza A by PCR NEGATIVE NEGATIVE Final  ? Influenza B by  PCR NEGATIVE NEGATIVE Final  ?  Comment: (NOTE) ?The Xpert Xpress SARS-CoV-2/FLU/RSV plus assay is intended as an aid ?in the diagnosis of influenza from Nasopharyngeal swab specimens and ?should not be used a

## 2021-06-06 ENCOUNTER — Encounter (HOSPITAL_COMMUNITY): Payer: Self-pay | Admitting: Family Medicine

## 2021-06-06 DIAGNOSIS — J9601 Acute respiratory failure with hypoxia: Secondary | ICD-10-CM | POA: Diagnosis not present

## 2021-06-06 DIAGNOSIS — J9612 Chronic respiratory failure with hypercapnia: Secondary | ICD-10-CM

## 2021-06-06 DIAGNOSIS — J9611 Chronic respiratory failure with hypoxia: Secondary | ICD-10-CM

## 2021-06-06 DIAGNOSIS — J4520 Mild intermittent asthma, uncomplicated: Secondary | ICD-10-CM | POA: Diagnosis not present

## 2021-06-06 DIAGNOSIS — I5033 Acute on chronic diastolic (congestive) heart failure: Secondary | ICD-10-CM | POA: Diagnosis not present

## 2021-06-06 LAB — BASIC METABOLIC PANEL
Anion gap: 8 (ref 5–15)
BUN: 9 mg/dL (ref 6–20)
CO2: 35 mmol/L — ABNORMAL HIGH (ref 22–32)
Calcium: 8.4 mg/dL — ABNORMAL LOW (ref 8.9–10.3)
Chloride: 94 mmol/L — ABNORMAL LOW (ref 98–111)
Creatinine, Ser: 0.82 mg/dL (ref 0.44–1.00)
GFR, Estimated: >60 mL/min (ref >60)
Glucose, Bld: 103 mg/dL — ABNORMAL HIGH (ref 70–99)
Potassium: 4.3 mmol/L (ref 3.5–5.1)
Sodium: 137 mmol/L (ref 135–145)

## 2021-06-06 LAB — CBC
HCT: 36.6 % (ref 36.0–46.0)
Hemoglobin: 10.5 g/dL — ABNORMAL LOW (ref 12.0–15.0)
MCH: 27.6 pg (ref 26.0–34.0)
MCHC: 28.7 g/dL — ABNORMAL LOW (ref 30.0–36.0)
MCV: 96.1 fL (ref 80.0–100.0)
Platelets: 189 10*3/uL (ref 150–400)
RBC: 3.81 MIL/uL — ABNORMAL LOW (ref 3.87–5.11)
RDW: 18.2 % — ABNORMAL HIGH (ref 11.5–15.5)
WBC: 5.4 10*3/uL (ref 4.0–10.5)
nRBC: 0 % (ref 0.0–0.2)

## 2021-06-06 MED ORDER — FUROSEMIDE 10 MG/ML IJ SOLN
60.0000 mg | Freq: Three times a day (TID) | INTRAMUSCULAR | Status: DC
  Administered 2021-06-06 – 2021-06-07 (×5): 60 mg via INTRAVENOUS
  Filled 2021-06-06 (×5): qty 6

## 2021-06-06 MED ORDER — ACETAMINOPHEN 500 MG PO TABS
1000.0000 mg | ORAL_TABLET | Freq: Three times a day (TID) | ORAL | Status: DC | PRN
  Administered 2021-06-06: 1000 mg via ORAL
  Filled 2021-06-06: qty 2

## 2021-06-06 NOTE — Assessment & Plan Note (Addendum)
Body mass index is 75.83 kg/m?.  ?This, along with poor SDOH profile, is primary driver of comorbidities at this time.  ?

## 2021-06-06 NOTE — Plan of Care (Signed)

## 2021-06-06 NOTE — TOC Initial Note (Signed)
Transition of Care (TOC) - Initial/Assessment Note  ? ? ?Patient Details  ?Name: Renee Paul ?MRN: [id number] ?Date of Birth: 02-04-1986 ? ?Transition of Care (TOC) CM/SW Contact:    ?Pollie Friar, RN ?Phone Number: ?06/06/2021, 1:41 PM ? ?Clinical Narrative:                 ?Patient lives with her spouse and minor children in a hotel or air b&b. She states she has been having issues trying to find housing for them with her eviction history. She and her spouse work but she has been currently missing work due to her health issues.  ?She does wear oxygen at home through [employer]. She feels she could use a shower seat at home.  ?She denies any issues with her home medications and she does drive herself to appointments.  ?TOC following. ? ?Expected Discharge Plan: Home/Self Care ?Barriers to Discharge: Continued Medical Work up ? ? ?Patient Goals and CMS Choice ?  ?  ?  ? ?Expected Discharge Plan and Services ?Expected Discharge Plan: Home/Self Care ?  ?Discharge Planning Services: CM Consult ?  ?Living arrangements for the past 2 months: Hotel/Motel ?                ?  ?  ?  ?  ?  ?  ?  ?  ?  ?  ? ?Prior Living Arrangements/Services ?Living arrangements for the past 2 months: Hotel/Motel ?Lives with:: Minor Children, Adult Children, Spouse ?Patient language and need for interpreter reviewed:: Yes ?Do you feel safe going back to the place where you live?: Yes      ?  ?  ?Current home services: DME (oxygen through [employer]) ?Criminal Activity/Legal Involvement Pertinent to Current Situation/Hospitalization: No - Comment as needed ? ?Activities of Daily Living ?  ?  ? ?Permission Sought/Granted ?  ?  ?   ?   ?   ?   ? ?Emotional Assessment ?Appearance:: Appears stated age ?Attitude/Demeanor/Rapport: Engaged ?Affect (typically observed): Accepting ?Orientation: : Oriented to Self, Oriented to Place, Oriented to  Time, Oriented to Situation ?  ?Psych Involvement: No (comment) ? ?Admission diagnosis:  Bilateral lower  abdominal cramping [R10.31, R10.32] ?Acute respiratory failure with hypoxia ([place]) [J96.01] ?Patient Active Problem List  ? Diagnosis Date Noted  ? Acute respiratory failure with hypoxia ([place]) 06/04/2021  ? Asthma   ? CHF (congestive heart failure) ([place])   ? Acute on chronic diastolic CHF (congestive heart failure) ([place]) 03/21/2021  ? Vaginal bleeding 03/21/2021  ? Symptomatic anemia 03/21/2021  ? Chronic respiratory failure with hypoxia and hypercapnia ([place]) 03/21/2021  ? OSA (obstructive sleep apnea) 03/21/2021  ? ?PCP:  Medicine, [place] ?Pharmacy:   ?[place] ?[place] [address] ?Phone: [phone] Fax: [phone] ? ?[id number] [place] [place] - [address] ?[id number]?Phone: [phone]: [phone]? ? ? ? ?Social Determinants of Health (SDOH) Interventions ?  ? ?Readmission Risk Interventions ?No flowsheet data found. ? ? ?

## 2021-06-06 NOTE — Progress Notes (Signed)
Heart Failure Nurse Navigator Progress Note ? ?PCP: Medicine, [place] ?PCP-Cardiologist: Dr.Richard Erline Levine ?Admission Diagnosis: Vaginal Bleeding, SOB ?Admitted from: Home ? ?Presentation:   ?Jani Gravel presented with acute onset of vaginal bleeding after her IUD feel out and SOB. Has been having bleeding issues x1 year. Has been having a cough, wheezing, and dyspnea on exertion, lower extremity +1 pitting edema. O2 on adm was 77%, increased to 94% after O2 at 2L was placed, diminished breath sounds. Patient stated hasn't taken her lasix over a week, because she didn't want to have to go to the bathroom. She has been SOB with exertion for awhile. She is extremely overwhelmed health situation and life events. Family currently staying in a hotel for this week d/t not able to get a apartment, both husband and pt have evictions on their records and can't rent. Family slept in their car Saturday night. 2/3 kids currently at a relatives house so they can attend school. Oldest at hotel with husband . Pt stated concern for her job due to absences r/t her health. issues with getting her meds, and non compliance of taking them. Food, they eat what they can. Patient working on quitting smoking, down to 1 pk per day vs 2.  ? ?ECHO/ LVEF: 60-65% ? ?Clinical Course: ? ?Past Medical History:  ?Diagnosis Date  ? Asthma   ? CHF (congestive heart failure) ([place])   ? Morbid obesity ([place])   ?  ? ?Social History  ? ?Socioeconomic History  ? Marital status: Married  ?  Spouse name: Cecilio Asper  ? Number of children: 3  ? Years of education: Not on file  ? Highest education level: 12th grade  ?Occupational History  ? Occupation: Airline pilot  ?  Employer: [employer]  ?  Comment: escolation speacialist.  ?Tobacco Use  ? Smoking status: Every Day  ?  Packs/day: 1.00  ?  Years: 18.00  ?  Pack years: 18.00  ?  Types: Cigarettes  ? Smokeless tobacco: Never  ? Tobacco comments:  ?  Patient working on quiting down from 2 packs  per day to 1 day.   ?Vaping Use  ? Vaping Use: Never used  ?Substance and Sexual Activity  ? Alcohol use: Yes  ?  Alcohol/week: 8.0 standard drinks  ?  Types: 7 Glasses of wine, 1 Cans of beer per week  ?  Comment: 7 glasses of wine a week.  ? Drug use: No  ?  Comment: none  ? Sexual activity: Not on file  ?Other Topics Concern  ? Not on file  ?Social History Narrative  ? Not on file  ? ?Social Determinants of Health  ? ?Financial Resource Strain: Not on file  ?Food Insecurity: Not on file  ?Transportation Needs: Not on file  ?Physical Activity: Not on file  ?Stress: Not on file  ?Social Connections: Not on file  ? ? ?High Risk Criteria for Readmission and/or Poor Patient Outcomes: ?Heart failure hospital admissions (last 6 months): 3  ?No Show rate: 11% ?Difficult social situation: yes, housing currently in a hotel or AirBB with husband /3 kids.  ?Demonstrates medication adherence: non compliant on taking her lasix.  ?Primary Language: english ?Literacy level: HS graduate ? ?Barriers of Care:   ?Medication cost, compliance with meds, Housing for family, potential job loss with health issues.  ? ?Considerations/Referrals:  ? ?Referral made to Heart Failure Pharmacist Stewardship: Med costs for compliance. ?Referral made to Heart Failure CSW/NCM TOC: Housing, in a hotel/Air B&B  with husband and 3 kids.  ?Referral made to Heart & Vascular TOC clinic: yes, 3 admits for HF over last 6 months.  ? ?Items for Follow-up on DC/TOC: ?Medication costs ?Housing- CSW ?Counseling- Pt stress with health issues and life events.  ? ? ?Earnestine Leys, BSN, RN ?Heart Failure Nurse Navigator ?[phone]  ?

## 2021-06-06 NOTE — Progress Notes (Signed)
?Progress Note ? ?PatientAudrielle Paul PYK:[id number] DOB: [birth year]  ?DOA: 06/04/2021  DOS: 06/06/2021  ?  ?Brief hospital course: ?Kandra Graven is a 36 y.o. African-American female with medical history significant for asthma, CHF and obesity, who presented to the emergency room with acute onset of increased vaginal bleeding after her IUD fell out. The patient had a hysteroscopy on 3/2 by her GYN doctor at [facility] and had an IUD inserted as she has been having bleeding for almost a year. She was seen in at [facility] on Saturday night for vaginal discharge and an abdominal and pelvic CT scan done revealed that her IUD was out of place. ?  ?She admits to cough as well as occasional wheezing and dyspnea with associated orthopnea, dyspnea on exertion and paroxysmal nocturnal dyspnea with lower extremity edema.  She stopped taking her Lasix about a week ago as she did not want to run frequently to the bathroom. ? ?Assessment and Plan: ?Acute on chronic diastolic CHF (congestive heart failure) ([place]) ?- Augment diuresis to lasix '60mg'$  IV TID. Contraction alkalosis is noted, though BUN:Cr is low. Clinically volume status is difficult to determine. Echo limited due to habitus, though LVEF appeared preserved, IVC is dilated and blunted, unclear diastolic function.  ?- Monitor I/O, weights, and BMP. ? ?Acute respiratory failure with hypoxia ([place]) ?Wean oxygen as tolerated. Pt actually is supposed to use oxygen chronically. Will optimize volume status and ambulate to quantify chronic component of hypoxia. ? ?Vaginal bleeding ?- Monitor CBC again in AM until Hgb stable. Continues to have bleeding.  ?- Continue megace '40mg'$  po BID at least until follow up with gyn as outpatient next week. OB/GYN consulted and evaluated the patient 3/14. IUD may continue to be best option for the patient. ? ?Asthma ?- No current exacerbation. ?- Albuterol nebulizer as needed. ? ?OSA (obstructive sleep apnea) ?-  We will utilize CPAP nightly. ? ?Morbid obesity ([place]) ?Body mass index is 78.21 kg/m?.  ?This, along with poor SDOH profile, is primary driver of comorbidities at this time.  ? ?Subjective: Dyspnea is stable, not very different from admission. Subjectively adequate UOP. Weight down 2kg, almost 3L UOP /24 hrs. Has f/u with Dr. Gwenlyn Found 3/21.  ? ?Objective: ?Vitals:  ? 06/06/21 5397 06/06/21 1353 06/06/21 1509 06/06/21 1511  ?BP: 128/79 139/72    ?Pulse: 69 62    ?Resp: 18 19    ?Temp: 98.5 ?F (36.9 ?C) 98.4 ?F (36.9 ?C)    ?TempSrc: Oral Axillary    ?SpO2: 95% 100% 95% 93%  ?Weight:      ?Height:      ? ?Gen: Obese, tired-appearing but rousable and oriented 36 y.o. female in no distress ?Pulm: Nonlabored, distant.  ?CV: Regular rate and rhythm. No definite murmur, rub, or gallop. UTD JVD, trace pitting and suspected nonpitting dependent edema. ?GI: Abdomen soft, non-tender, non-distended, with normoactive bowel sounds.  ?Ext: Warm, no deformities ?Skin: No rashes, lesions or ulcers on visualized skin. ?Neuro: Alert and oriented. No focal neurological deficits. ?Psych: Judgement and insight appear fair. Mood euthymic & affect congruent. Behavior is appropriate.   ? ?Data Personally reviewed: ? ?CBC: ?Recent Labs  ?Lab 06/04/21 ?2220 06/04/21 ?2221 06/05/21 ?6734 06/06/21 ?1937  ?WBC  --  5.4 5.8 5.4  ?NEUTROABS  --  3.7  --   --   ?HGB 12.9 11.2* 10.9* 10.5*  ?HCT 38.0 40.1 38.4 36.6  ?MCV  --  97.3 95.5 96.1  ?PLT  --  264 271 189  ? ?Basic Metabolic Panel: ?Recent Labs  ?Lab 06/04/21 ?2155 06/04/21 ?2220 06/05/21 ?6599 06/06/21 ?3570  ?NA 136 139 139 137  ?K 5.9* 4.4 4.3 4.3  ?CL 102 100 100 94*  ?CO2 23  --  33* 35*  ?GLUCOSE 74 77 133* 103*  ?BUN '10 9 7 9  '$ ?CREATININE 0.79 0.80 0.93 0.82  ?CALCIUM 8.3*  --  8.4* 8.4*  ? ?GFR: ?Estimated Creatinine Clearance: 177.2 mL/min (by C-G formula based on SCr of 0.82 mg/dL). ?Liver Function Tests: ?Recent Labs  ?Lab 06/04/21 ?2155  ?AST 40  ?ALT 12  ?ALKPHOS 47  ?BILITOT 2.4*   ?PROT 7.5  ?ALBUMIN 3.3*  ? ?No results for input(s): LIPASE, AMYLASE in the last 168 hours. ?No results for input(s): AMMONIA in the last 168 hours. ?Coagulation Profile: ?No results for input(s): INR, PROTIME in the last 168 hours. ?Cardiac Enzymes: ?No results for input(s): CKTOTAL, CKMB, CKMBINDEX, TROPONINI in the last 168 hours. ?BNP (last 3 results) ?No results for input(s): PROBNP in the last 8760 hours. ?HbA1C: ?No results for input(s): HGBA1C in the last 72 hours. ?CBG: ?No results for input(s): GLUCAP in the last 168 hours. ?Lipid Profile: ?No results for input(s): CHOL, HDL, LDLCALC, TRIG, CHOLHDL, LDLDIRECT in the last 72 hours. ?Thyroid Function Tests: ?Recent Labs  ?  06/05/21 ?1779  ?TSH 5.785*  ? ?Anemia Panel: ?No results for input(s): VITAMINB12, FOLATE, FERRITIN, TIBC, IRON, RETICCTPCT in the last 72 hours. ?Urine analysis: ?   ?Component Value Date/Time  ? [place] YELLOW 06/05/2021 0416  ? APPEARANCEUR CLEAR 06/05/2021 0416  ? LABSPEC 1.014 06/05/2021 0416  ? PHURINE 7.0 06/05/2021 0416  ? GLUCOSEU NEGATIVE 06/05/2021 0416  ? HGBUR LARGE (A) 06/05/2021 0416  ? [place] NEGATIVE 06/05/2021 0416  ? [place] NEGATIVE 06/05/2021 0416  ? [place] NEGATIVE 06/05/2021 0416  ? NITRITE NEGATIVE 06/05/2021 0416  ? LEUKOCYTESUR NEGATIVE 06/05/2021 0416  ? ?Recent Results (from the past 240 hour(s))  ?Wet prep, genital     Status: None  ? Collection Time: 06/04/21 10:25 PM  ? Specimen: Nasopharyngeal Swab  ?Result Value Ref Range Status  ? Yeast Wet Prep HPF POC NONE SEEN NONE SEEN Final  ? Trich, Wet Prep NONE SEEN NONE SEEN Final  ? Clue Cells Wet Prep HPF POC NONE SEEN NONE SEEN Final  ? WBC, Wet Prep HPF POC <10 <10 Final  ? Sperm NONE SEEN  Final  ?  Comment: Performed at [facility], [address] [place], [place] [address]  ?Resp Panel by RT-PCR (Flu A&B, Covid) Nasopharyngeal Swab     Status: None  ? Collection Time: 06/05/21 12:11 AM  ? Specimen: Nasopharyngeal Swab;  Nasopharyngeal(NP) swabs in vial transport medium  ?Result Value Ref Range Status  ? SARS Coronavirus 2 by RT PCR NEGATIVE NEGATIVE Final  ?  Comment: (NOTE) ?SARS-CoV-2 target nucleic acids are NOT DETECTED. ? ?The SARS-CoV-2 RNA is generally detectable in upper respiratory ?specimens during the acute phase of infection. The lowest ?concentration of SARS-CoV-2 viral copies this assay can detect is ?138 copies/mL. A negative result does not preclude SARS-Cov-2 ?infection and should not be used as the sole basis for treatment or ?other patient management decisions. A negative result may occur with  ?improper specimen collection/handling, submission of specimen other ?than nasopharyngeal swab, presence of viral mutation(s) within the ?areas targeted by this assay, and inadequate number of viral ?copies(<138 copies/mL). A negative result must be combined with ?clinical observations, patient history, and  epidemiological ?information. The expected result is Negative. ? ?Fact Sheet for Patients:  ?EntrepreneurPulse.com.au ? ?Fact Sheet for Healthcare Providers:  ?IncredibleEmployment.be ? ?This test is no t yet approved or cleared by the [place] FDA and  ?has been authorized for detection and/or diagnosis of SARS-CoV-2 by ?FDA under an Emergency Use Authorization (EUA). This EUA will remain  ?in effect (meaning this test can be used) for the duration of the ?COVID-19 declaration under Section 564(b)(1) of the Act, 21 ?U.S.C.section 360bbb-3(b)(1), unless the authorization is terminated  ?or revoked sooner.  ? ? ?  ? Influenza A by PCR NEGATIVE NEGATIVE Final  ? Influenza B by PCR NEGATIVE NEGATIVE Final  ?  Comment: (NOTE) ?The Xpert Xpress SARS-CoV-2/FLU/RSV plus assay is intended as an aid ?in the diagnosis of influenza from Nasopharyngeal swab specimens and ?should not be used as a sole basis for treatment. Nasal washings and ?aspirates are unacceptable for Xpert Xpress  SARS-CoV-2/FLU/RSV ?testing. ? ?Fact Sheet for Patients: ?EntrepreneurPulse.com.au ? ?Fact Sheet for Healthcare Providers: ?IncredibleEmployment.be ? ?This test is not yet approved or cle

## 2021-06-06 NOTE — Progress Notes (Signed)
Mobility Specialist Progress Note  ? ? 06/06/21 1405  ?Mobility  ?Activity Refused mobility  ? ?Pt c/o headache. Will f/u as schedule permits.  ? ?Renee Paul ?Mobility Specialist  ?M.S. 5N: [phone]  ?

## 2021-06-07 ENCOUNTER — Other Ambulatory Visit (HOSPITAL_COMMUNITY): Payer: Self-pay

## 2021-06-07 DIAGNOSIS — J9601 Acute respiratory failure with hypoxia: Secondary | ICD-10-CM | POA: Diagnosis not present

## 2021-06-07 DIAGNOSIS — D62 Acute posthemorrhagic anemia: Secondary | ICD-10-CM

## 2021-06-07 DIAGNOSIS — J9611 Chronic respiratory failure with hypoxia: Secondary | ICD-10-CM | POA: Diagnosis not present

## 2021-06-07 DIAGNOSIS — N939 Abnormal uterine and vaginal bleeding, unspecified: Secondary | ICD-10-CM | POA: Diagnosis not present

## 2021-06-07 DIAGNOSIS — J4520 Mild intermittent asthma, uncomplicated: Secondary | ICD-10-CM | POA: Diagnosis not present

## 2021-06-07 DIAGNOSIS — I5033 Acute on chronic diastolic (congestive) heart failure: Secondary | ICD-10-CM | POA: Diagnosis not present

## 2021-06-07 LAB — BASIC METABOLIC PANEL
Anion gap: 12 (ref 5–15)
BUN: 12 mg/dL (ref 6–20)
CO2: 32 mmol/L (ref 22–32)
Calcium: 8.3 mg/dL — ABNORMAL LOW (ref 8.9–10.3)
Chloride: 91 mmol/L — ABNORMAL LOW (ref 98–111)
Creatinine, Ser: 0.66 mg/dL (ref 0.44–1.00)
GFR, Estimated: >60 mL/min (ref >60)
Glucose, Bld: 97 mg/dL (ref 70–99)
Potassium: 3.8 mmol/L (ref 3.5–5.1)
Sodium: 135 mmol/L (ref 135–145)

## 2021-06-07 LAB — T4, FREE: Free T4: 0.59 ng/dL — ABNORMAL LOW (ref 0.61–1.12)

## 2021-06-07 NOTE — Progress Notes (Signed)
Heart Failure Stewardship Pharmacist Progress Note ? ? ?PCP: Medicine, [place] ?PCP-Cardiologist: None  ? ? ?HPI:  ?36 yo F with PMH of asthma, CHF, and obesity. She presented to the ED on 3/13 with acute onset of vaginal bleeding after her IUD fell out. She was also notable for cough, dyspnea, PND, and orthopnea. CXR with cardiomegaly without acute appearing airspace opacity. An ECHO was done on 3/14 and LVEF was 60-65% (although technically difficult study with body habitus). ? ?Current HF Medications: ?Diuretic: furosemide 60 mg IV TID ? ?Prior to admission HF Medications: ?Diuretic: furosemide 80 mg daily ? ?Pertinent Lab Values: ?Serum creatinine 0.66, BUN 12, Potassium 3.8, Sodium 135, BNP 50 ? ?Vital Signs: ?Weight: 463 lbs (admission weight: 474 lbs) ?Blood pressure: 130/80s  ?Heart rate: 70s  ?I/O: -3.5L yesterday; net -4.3L ? ?Medication Assistance / Insurance Benefits Check: ?Does the patient have prescription insurance?  Yes ?Type of insurance plan: Aetna ? ?Not contracted with [facility] - unable to run test claims ? ?Outpatient Pharmacy:  ?[facility] ?Is the patient willing to use [facility] at discharge? Yes ?Is the patient willing to transition their outpatient pharmacy to utilize a [facility] outpatient pharmacy?   Pending ?  ? ?Assessment: ?1. Acute on chronic diastolic CHF (EF 09-32%), due to NICM. NYHA class III symptoms. ?- Continue furosemide 60 mg IV TID  ?- Consider adding Entresto 24/26 mg BID  ?- Consider checking A1c and adding SGLT2i  ?  ?Plan: ?1) Medication changes recommended at this time: ?- Add Entresto 24/26 mg BID ?- Check A1c ? ?2) Patient assistance: ?- Insurance not contracted with [facility] - checking to see if we can still fill at [facility] (unable to run copay checks) ? ?3)  Education  ?- To be completed prior to discharge ? ?Kerby Nora, PharmD, BCPS ?Heart Failure Stewardship Pharmacist ?Phone [phone] ? ? ?

## 2021-06-07 NOTE — Progress Notes (Signed)
?Progress Note ? ?PatientAiman Paul QMG:[id number] DOB: 1985/12/08  ?DOA: 06/04/2021  DOS: 06/07/2021  ?  ?Brief hospital course: ?Renee Paul is a 36 y.o. African-American female with medical history significant for asthma, CHF and obesity, who presented to the emergency room with acute onset of increased vaginal bleeding after her IUD fell out. The patient had a hysteroscopy on 3/2 by her GYN doctor at [facility] and had an IUD inserted as she has been having bleeding for almost a year. She was seen in at [facility] regional hospital on Saturday night for vaginal discharge and an abdominal and pelvic CT scan done revealed that her IUD was out of place. ?  ?She admits to cough as well as occasional wheezing and dyspnea with associated orthopnea, dyspnea on exertion and paroxysmal nocturnal dyspnea with lower extremity edema.  She stopped taking her Lasix about a week ago as she did not want to run frequently to the bathroom. ? ?Assessment and Plan: ?Acute on chronic diastolic CHF (congestive heart failure) ([place]) ?- Cr stable, 3.5L UOP over past 24 hours, will continue lasix '60mg'$  IV TID. Clinically volume status is difficult to determine. Echo limited due to habitus, though LVEF appeared preserved, IVC is dilated and blunted, unclear diastolic function.  ?- Monitor I/O, weights, and BMP. ? ?Acute respiratory failure with hypoxia ([place]) ?Wean oxygen as tolerated. Pt actually is supposed to use oxygen chronically. Will optimize volume status and ambulate to quantify chronic component of hypoxia. ? ?Abnormal uterine bleeding ?Bleeding improving/decreasing. ?- Continue megace '40mg'$  po BID at least until follow up with gyn as outpatient next week. OB/GYN consulted and evaluated the patient 3/14, 3/16. IUD may continue to be best option for the patient who is at very high periprocedural risk. ? ?Asthma ?- No current exacerbation. ?- Albuterol nebulizer as needed. ? ?OSA (obstructive sleep apnea) ?- We will  utilize CPAP nightly. ? ?Acute blood loss anemia ?- Recheck H/H in AM.  ?- Continue oral iron. ? ?Morbid obesity ([place]) ?Body mass index is 78.21 kg/m?.  ?This, along with poor SDOH profile, is primary driver of comorbidities at this time.  ? ?Subjective: No major change in past 24 hours in dyspnea, but urinating frequently. No chest pain. VB improving. Weight down another ~2kg. ? ?Objective: ?Vitals:  ? 06/06/21 1511 06/06/21 2135 06/07/21 0443 06/07/21 1100  ?BP:  134/80 137/77 (!) 152/81  ?Pulse:  89 76 76  ?Resp:  '18 17 18  '$ ?Temp:  98.3 ?F (36.8 ?C) 98.7 ?F (37.1 ?C)   ?TempSrc:  Oral Oral   ?SpO2: 93% 95% 95% 100%  ?Weight:   (!) 210.1 kg   ?Height:      ? ?Gen: 36 y.o. female in no distress ?Pulm: Nonlabored breathing supplemental oxygen, distant. ?CV: Regular rate and rhythm. No murmur, rub, or gallop. UTD JVD, +pitting and nonpitting dependent edema. ?GI: Abdomen soft, non-tender, non-distended, with normoactive bowel sounds.  ?Ext: Warm, no deformities ?Skin: No new rashes, lesions or ulcers on visualized skin. ?Neuro: Alert and oriented. No focal neurological deficits. ?Psych: Judgement and insight appear fair. Mood euthymic & affect congruent. Behavior is appropriate.   ? ?Data Personally reviewed: ? ?CBC: ?Recent Labs  ?Lab 06/04/21 ?2220 06/04/21 ?2221 06/05/21 ?3267 06/06/21 ?1245  ?WBC  --  5.4 5.8 5.4  ?NEUTROABS  --  3.7  --   --   ?HGB 12.9 11.2* 10.9* 10.5*  ?HCT 38.0 40.1 38.4 36.6  ?MCV  --  97.3 95.5 96.1  ?PLT  --  264 271 189  ? ?Basic Metabolic Panel: ?Recent Labs  ?Lab 06/04/21 ?2155 06/04/21 ?2220 06/05/21 ?7939 06/06/21 ?0300 06/07/21 ?0348  ?NA 136 139 139 137 135  ?K 5.9* 4.4 4.3 4.3 3.8  ?CL 102 100 100 94* 91*  ?CO2 23  --  33* 35* 32  ?GLUCOSE 74 77 133* 103* 97  ?BUN '10 9 7 9 12  '$ ?CREATININE 0.79 0.80 0.93 0.82 0.66  ?CALCIUM 8.3*  --  8.4* 8.4* 8.3*  ? ?GFR: ?Estimated Creatinine Clearance: 179.7 mL/min (by C-G formula based on SCr of 0.66 mg/dL). ?Liver Function Tests: ?Recent Labs   ?Lab 06/04/21 ?2155  ?AST 40  ?ALT 12  ?ALKPHOS 47  ?BILITOT 2.4*  ?PROT 7.5  ?ALBUMIN 3.3*  ? ?No results for input(s): LIPASE, AMYLASE in the last 168 hours. ?No results for input(s): AMMONIA in the last 168 hours. ?Coagulation Profile: ?No results for input(s): INR, PROTIME in the last 168 hours. ?Cardiac Enzymes: ?No results for input(s): CKTOTAL, CKMB, CKMBINDEX, TROPONINI in the last 168 hours. ?BNP (last 3 results) ?No results for input(s): PROBNP in the last 8760 hours. ?HbA1C: ?No results for input(s): HGBA1C in the last 72 hours. ?CBG: ?No results for input(s): GLUCAP in the last 168 hours. ?Lipid Profile: ?No results for input(s): CHOL, HDL, LDLCALC, TRIG, CHOLHDL, LDLDIRECT in the last 72 hours. ?Thyroid Function Tests: ?Recent Labs  ?  06/05/21 ?9233 06/07/21 ?0348  ?TSH 5.785*  --   ?FREET4  --  0.59*  ? ?Anemia Panel: ?No results for input(s): VITAMINB12, FOLATE, FERRITIN, TIBC, IRON, RETICCTPCT in the last 72 hours. ?Urine analysis: ?   ?Component Value Date/Time  ? [place] YELLOW 06/05/2021 0416  ? APPEARANCEUR CLEAR 06/05/2021 0416  ? LABSPEC 1.014 06/05/2021 0416  ? PHURINE 7.0 06/05/2021 0416  ? GLUCOSEU NEGATIVE 06/05/2021 0416  ? HGBUR LARGE (A) 06/05/2021 0416  ? [place] NEGATIVE 06/05/2021 0416  ? [place] NEGATIVE 06/05/2021 0416  ? [place] NEGATIVE 06/05/2021 0416  ? NITRITE NEGATIVE 06/05/2021 0416  ? LEUKOCYTESUR NEGATIVE 06/05/2021 0416  ? ?Recent Results (from the past 240 hour(s))  ?Wet prep, genital     Status: None  ? Collection Time: 06/04/21 10:25 PM  ? Specimen: Nasopharyngeal Swab  ?Result Value Ref Range Status  ? Yeast Wet Prep HPF POC NONE SEEN NONE SEEN Final  ? Trich, Wet Prep NONE SEEN NONE SEEN Final  ? Clue Cells Wet Prep HPF POC NONE SEEN NONE SEEN Final  ? WBC, Wet Prep HPF POC <10 <10 Final  ? Sperm NONE SEEN  Final  ?  Comment: Performed at [facility], [address] [place], [place] [address]  ?Resp Panel by RT-PCR (Flu A&B, Covid) Nasopharyngeal  Swab     Status: None  ? Collection Time: 06/05/21 12:11 AM  ? Specimen: Nasopharyngeal Swab; Nasopharyngeal(NP) swabs in vial transport medium  ?Result Value Ref Range Status  ? SARS Coronavirus 2 by RT PCR NEGATIVE NEGATIVE Final  ?  Comment: (NOTE) ?SARS-CoV-2 target nucleic acids are NOT DETECTED. ? ?The SARS-CoV-2 RNA is generally detectable in upper respiratory ?specimens during the acute phase of infection. The lowest ?concentration of SARS-CoV-2 viral copies this assay can detect is ?138 copies/mL. A negative result does not preclude SARS-Cov-2 ?infection and should not be used as the sole basis for treatment or ?other patient management decisions. A negative result may occur with  ?improper specimen collection/handling, submission of specimen other ?than nasopharyngeal swab, presence of viral mutation(s) within the ?areas targeted by  this assay, and inadequate number of viral ?copies(<138 copies/mL). A negative result must be combined with ?clinical observations, patient history, and epidemiological ?information. The expected result is Negative. ? ?Fact Sheet for Patients:  ?EntrepreneurPulse.com.au ? ?Fact Sheet for Healthcare Providers:  ?IncredibleEmployment.be ? ?This test is no t yet approved or cleared by the [place] FDA and  ?has been authorized for detection and/or diagnosis of SARS-CoV-2 by ?FDA under an Emergency Use Authorization (EUA). This EUA will remain  ?in effect (meaning this test can be used) for the duration of the ?COVID-19 declaration under Section 564(b)(1) of the Act, 21 ?U.S.C.section 360bbb-3(b)(1), unless the authorization is terminated  ?or revoked sooner.  ? ? ?  ? Influenza A by PCR NEGATIVE NEGATIVE Final  ? Influenza B by PCR NEGATIVE NEGATIVE Final  ?  Comment: (NOTE) ?The Xpert Xpress SARS-CoV-2/FLU/RSV plus assay is intended as an aid ?in the diagnosis of influenza from Nasopharyngeal swab specimens and ?should not be used as a sole  basis for treatment. Nasal washings and ?aspirates are unacceptable for Xpert Xpress SARS-CoV-2/FLU/RSV ?testing. ? ?Fact Sheet for Patients: ?EntrepreneurPulse.com.au ? ?Fact Sheet

## 2021-06-07 NOTE — Progress Notes (Signed)
SATURATION QUALIFICATIONS: (This note is used to comply with regulatory documentation for home oxygen) ? ?Patient Saturations on Room Air at Rest = 87% ? ?Patient Saturations on Room Air while Ambulating = 83% ? ?Patient Saturations on 4 Liters of oxygen while Ambulating = 90% ? ? ?

## 2021-06-07 NOTE — Progress Notes (Signed)
Subjective: ?Patient reports bleeding is minimal at this time. She feels better from a bleeding perspective but is frustrated by her other medical conditions. She is frustrated by her history of the bleeding. She reports she doesn't want a hysterectomy if she can avoid it. She also doesn't want an IUD because the past one fell out and she had a history of one becoming embedded. She thinks she might be able to be discharged on Saturday per her report.    ? ?Objective: ?Vitals:  ? 06/07/21 0443 06/07/21 1100  ?BP: 137/77 (!) 152/81  ?Pulse: 76 76  ?Resp: 17 18  ?Temp: 98.7 ?F (37.1 ?C)   ?SpO2: 95% 100%  ? ?I have reviewed patient's vital signs and labs. ? ?General: alert, cooperative, and morbidly obese ?Resp: clear to auscultation bilaterally ?Cardio: regular rate and rhythm ?GI: soft, non-tender; bowel sounds normal; no masses,  no organomegaly but exam limited by habitus ?Vaginal Bleeding: minimal ? ? ?CBC Latest Ref Rng & Units 06/06/2021 06/05/2021 06/04/2021  ?WBC 4.0 - 10.5 K/uL 5.4 5.8 5.4  ?Hemoglobin 12.0 - 15.0 g/dL 10.5(L) 10.9(L) 11.2(L)  ?Hematocrit 36.0 - 46.0 % 36.6 38.4 40.1  ?Platelets 150 - 400 K/uL 189 271 264  ? ?Assessment/Plan: ?AUB ?- Reviewed multifactorial causes for her bleeding: hypothyroidism, potential liver disease from alcohol dependence, fibroids and obesity. Fibroids are probably contributing little because location appears to be transmural from my review of her images from her ultrasound here. Today we revisited these causes and the impact of her weight on bleeding as well.  ?- Discussed ultimately she is high risk for surgery (including D&C, risk of serious complication is 16%) and reviewed this is due to her prior surgical history, current medical history and her obesity. She does not want a hysterectomy as noted above nor do I think that would be in her best interest until all non-surgical options have been exhausted. ?- At this time, her bleeding has responded well to the Megace.  Ultimately we reviewed that she will need to follow up with Dr. Gwenlyn Found for the best long-term plan and ultimately Megace taper to whatever that alternative is.  ?- Reviewed that it will be important to keep her f/u on 3/21 and she plans to do so.  ?- Anemia is likely from recent blood loss, but improved from 2 months ago (likely from Provera use during that time). She is on PO iron.  ?- At this time we will sign off but can be re-consulted for any new questions/concerns or you can call our service 24/7: [phone]  ? ? LOS: 3 days  ? ? ?Radene Gunning ?06/07/2021, 2:43 PM ? ? ? ? ?

## 2021-06-07 NOTE — Assessment & Plan Note (Addendum)
-   Recheck H/H reveals stability. Recommend recheck at follow up. ?- Continue oral iron. Give a dose of IV iron here w/ferritin and iron low normal, elevated TIBC, 7% Tsat. ?

## 2021-06-07 NOTE — Progress Notes (Signed)
Mobility Specialist Progress Note  ? ? 06/07/21 1542  ?Mobility  ?Activity Ambulated independently in hallway  ?Level of Assistance Independent  ?Assistive Device None  ?Distance Ambulated (ft) 180 ft  ?Activity Response Tolerated fair  ?$Mobility charge 1 Mobility  ? ?Post-Mobility: 79 HR, BP, 96% on 4L SpO2 ? ?Pt received in bed and agreeable. Ambulated on 4LO2 to maintain SpO2 >/=88%. Returned to sitting up in bed with call bell in reach.  ? ?Pt became emotional after return to bed about needing an immediate solution to her housing situation.  ? ?Renee Paul ?Mobility Specialist  ?M.S. 5N: [phone]  ?

## 2021-06-08 DIAGNOSIS — R7989 Other specified abnormal findings of blood chemistry: Secondary | ICD-10-CM

## 2021-06-08 LAB — FERRITIN: Ferritin: 16 ng/mL (ref 11–307)

## 2021-06-08 LAB — VITAMIN B12: Vitamin B-12: 256 pg/mL (ref 180–914)

## 2021-06-08 LAB — BASIC METABOLIC PANEL
Anion gap: 13 (ref 5–15)
BUN: 10 mg/dL (ref 6–20)
CO2: 33 mmol/L — ABNORMAL HIGH (ref 22–32)
Calcium: 8.8 mg/dL — ABNORMAL LOW (ref 8.9–10.3)
Chloride: 91 mmol/L — ABNORMAL LOW (ref 98–111)
Creatinine, Ser: 0.68 mg/dL (ref 0.44–1.00)
GFR, Estimated: >60 mL/min (ref >60)
Glucose, Bld: 95 mg/dL (ref 70–99)
Potassium: 3.9 mmol/L (ref 3.5–5.1)
Sodium: 137 mmol/L (ref 135–145)

## 2021-06-08 LAB — HEMOGLOBIN AND HEMATOCRIT, BLOOD
HCT: 39.6 % (ref 36.0–46.0)
Hemoglobin: 11.9 g/dL — ABNORMAL LOW (ref 12.0–15.0)

## 2021-06-08 LAB — IRON AND TIBC
Iron: 33 ug/dL (ref 28–170)
Saturation Ratios: 7 % — ABNORMAL LOW (ref 10.4–31.8)
TIBC: 462 ug/dL — ABNORMAL HIGH (ref 250–450)
UIBC: 429 ug/dL

## 2021-06-08 LAB — FOLATE: Folate: 11.4 ng/mL (ref >5.9)

## 2021-06-08 MED ORDER — FUROSEMIDE 10 MG/ML IJ SOLN
80.0000 mg | Freq: Three times a day (TID) | INTRAMUSCULAR | Status: DC
  Administered 2021-06-08 – 2021-06-10 (×7): 80 mg via INTRAVENOUS
  Filled 2021-06-08 (×7): qty 8

## 2021-06-08 MED ORDER — SODIUM CHLORIDE 0.9 % IV SOLN
510.0000 mg | Freq: Once | INTRAVENOUS | Status: AC
  Administered 2021-06-08: 510 mg via INTRAVENOUS
  Filled 2021-06-08: qty 17

## 2021-06-08 NOTE — Progress Notes (Signed)
Attempted walking sat x2 this shift.  Each time she refused to wear O2. ?Pt desat as soon as she started to ambulate to 83% on RA.  Refused to put O2. ?Pt is resting in a chair. O2 sat 90% on RA. ? ?Idolina Primer, RN ?

## 2021-06-08 NOTE — Progress Notes (Signed)
Mobility Specialist Progress Note  ? ? 06/08/21 1313  ?Mobility  ?Activity Ambulated independently in hallway  ?Level of Assistance Independent  ?Assistive Device None  ?Distance Ambulated (ft) 250 ft  ?Activity Response Tolerated fair  ?$Mobility charge 1 Mobility  ? ?Post-Mobility: 97% on 3L SpO2 ? ?Pt received in bed and agreeable. Pt expressed she is not comfortable with wearing oxygen in public and wanted to walk without O2. Took x1 short standing rest break and encouraged pursed lip breathing throughout. Pt SOB with exertion but did not c/o anything. Returned to bed and put O2 back on after sitting in chair to recover.  ? ?Renee Paul ?Mobility Specialist  ?M.S. 5N: [phone]  ?

## 2021-06-08 NOTE — Progress Notes (Signed)
?Progress Note ? ?PatientMaribel Paul Paul DOB: 27-Mar-1985  ?DOA: 06/04/2021  DOS: 06/08/2021  ?  ?Brief hospital course: ?Renee Paul is a 36 y.o. African-American female with medical history significant for asthma, CHF and obesity, who presented to the emergency room with acute onset of increased vaginal bleeding after her IUD fell out. The patient had a hysteroscopy on 3/2 by her GYN doctor at [facility] and had an IUD inserted as she has been having bleeding for almost a year. She was seen in at [facility] hospital on Saturday night for vaginal discharge and an abdominal and pelvic CT scan done revealed that her IUD was out of place. ?  ?She admits to cough as well as occasional wheezing and dyspnea with associated orthopnea, dyspnea on exertion and paroxysmal nocturnal dyspnea with lower extremity edema.  She stopped taking her Lasix about a week ago as she did not want to run frequently to the bathroom. ? ?Assessment and Plan: ?Acute on chronic diastolic CHF (congestive heart failure) ([place]) ?- Cr stable, UOP fair yesterday but still net positive.  ?- Augment lasix to '80mg'$  IV TID. Clinically volume status is difficult to determine, though weight continues steady decline 215kg > 213 > 210 > 206.7kg. Echo limited due to habitus, though LVEF appeared preserved, IVC is dilated and blunted, unclear diastolic function.  ?- Monitor I/O, weights, and BMP. ? ?Acute respiratory failure with hypoxia ([place]) ?Wean oxygen as tolerated. Pt actually is supposed to use oxygen chronically. Will optimize volume status and ambulate to quantify chronic component of hypoxia. ? ?Abnormal uterine bleeding ?Bleeding improving/decreasing. ?- Continue megace '40mg'$  po BID at least until follow up with gyn as outpatient next week. OB/GYN consulted and evaluated the patient 3/14, 3/16. IUD may continue to be best option for the patient who is at very high periprocedural risk. ? ?Asthma ?- No current  exacerbation. ?- Albuterol nebulizer as needed. ? ?OSA (obstructive sleep apnea) ?- We will utilize CPAP nightly. ? ?Elevated TSH ?Mild with mildly low free T4. Checked in acute illness and has no specific hypothyroid symptoms, so recommend rechecking after acute illness is over. ? ?Acute blood loss anemia ?- Recheck H/H reveals stability. ?- Continue oral iron. Give a dose of IV iron here w/ferritin and iron low normal, elevated TIBC, 7% Tsat. ? ?Morbid obesity ([place]) ?Body mass index is 75.83 kg/m?.  ?This, along with poor SDOH profile, is primary driver of comorbidities at this time.  ? ?Subjective: Short of breath with exertion worse than baseline, but says she's leaving tomorrow. No chest pain, bleeding much better. No other complaints this AM. ? ?Objective: ?Vitals:  ? 06/07/21 1100 06/07/21 1920 06/08/21 0538 06/08/21 1411  ?BP: (!) 152/81 124/79 136/75 124/75  ?Pulse: 76 80 84 76  ?Resp: '18 18  17  '$ ?Temp:  98.4 ?F (36.9 ?C) 98.4 ?F (36.9 ?C) 98.4 ?F (36.9 ?C)  ?TempSrc:  Oral Oral Oral  ?SpO2: 100% 96% 96% 96%  ?Weight:   (!) 206.7 kg   ?Height:      ? ?Gen: 36 y.o. female in no distress ?Pulm: Nonlabored breathing supplemental oxygen, distant but clear. ?CV: Regular rate and rhythm. No murmur, rub, or gallop. UTD JVD, probable nonpitting dependent edema. ?GI: Abdomen soft, non-tender, non-distended, with normoactive bowel sounds.  ?Ext: Warm, no deformities ?Skin: No new rashes, lesions or ulcers on visualized skin. ?Neuro: Alert and oriented. No focal neurological deficits. ?Psych: Judgement and insight appear fair. Mood euthymic & affect congruent. Behavior  is appropriate.   ? ?Data Personally reviewed: ? ?CBC: ?Recent Labs  ?Lab 06/04/21 ?2220 06/04/21 ?2221 06/05/21 ?7353 06/06/21 ?2992 06/08/21 ?0303  ?WBC  --  5.4 5.8 5.4  --   ?NEUTROABS  --  3.7  --   --   --   ?HGB 12.9 11.2* 10.9* 10.5* 11.9*  ?HCT 38.0 40.1 38.4 36.6 39.6  ?MCV  --  97.3 95.5 96.1  --   ?PLT  --  264 271 189  --   ? ?Basic  Metabolic Panel: ?Recent Labs  ?Lab 06/04/21 ?2155 06/04/21 ?2220 06/05/21 ?4268 06/06/21 ?3419 06/07/21 ?6222 06/08/21 ?0303  ?NA 136 139 139 137 135 137  ?K 5.9* 4.4 4.3 4.3 3.8 3.9  ?CL 102 100 100 94* 91* 91*  ?CO2 23  --  33* 35* 32 33*  ?GLUCOSE 74 77 133* 103* 97 95  ?BUN '10 9 7 9 12 10  '$ ?CREATININE 0.79 0.80 0.93 0.82 0.66 0.68  ?CALCIUM 8.3*  --  8.4* 8.4* 8.3* 8.8*  ? ?GFR: ?Estimated Creatinine Clearance: 177.7 mL/min (by C-G formula based on SCr of 0.68 mg/dL). ?Liver Function Tests: ?Recent Labs  ?Lab 06/04/21 ?2155  ?AST 40  ?ALT 12  ?ALKPHOS 47  ?BILITOT 2.4*  ?PROT 7.5  ?ALBUMIN 3.3*  ? ?No results for input(s): LIPASE, AMYLASE in the last 168 hours. ?No results for input(s): AMMONIA in the last 168 hours. ?Coagulation Profile: ?No results for input(s): INR, PROTIME in the last 168 hours. ?Cardiac Enzymes: ?No results for input(s): CKTOTAL, CKMB, CKMBINDEX, TROPONINI in the last 168 hours. ?BNP (last 3 results) ?No results for input(s): PROBNP in the last 8760 hours. ?HbA1C: ?No results for input(s): HGBA1C in the last 72 hours. ?CBG: ?No results for input(s): GLUCAP in the last 168 hours. ?Lipid Profile: ?No results for input(s): CHOL, HDL, LDLCALC, TRIG, CHOLHDL, LDLDIRECT in the last 72 hours. ?Thyroid Function Tests: ?Recent Labs  ?  06/07/21 ?0348  ?FREET4 0.59*  ? ?Anemia Panel: ?Recent Labs  ?  06/08/21 ?0303  ?VITAMINB12 256  ?FOLATE 11.4  ?FERRITIN 16  ?TIBC 462*  ?IRON 33  ? ?Urine analysis: ?   ?Component Value Date/Time  ? [place] YELLOW 06/05/2021 0416  ? APPEARANCEUR CLEAR 06/05/2021 0416  ? LABSPEC 1.014 06/05/2021 0416  ? PHURINE 7.0 06/05/2021 0416  ? GLUCOSEU NEGATIVE 06/05/2021 0416  ? HGBUR LARGE (A) 06/05/2021 0416  ? [place] NEGATIVE 06/05/2021 0416  ? [place] NEGATIVE 06/05/2021 0416  ? [place] NEGATIVE 06/05/2021 0416  ? NITRITE NEGATIVE 06/05/2021 0416  ? LEUKOCYTESUR NEGATIVE 06/05/2021 0416  ? ?Recent Results (from the past 240 hour(s))  ?Wet prep, genital      Status: None  ? Collection Time: 06/04/21 10:25 PM  ? Specimen: Nasopharyngeal Swab  ?Result Value Ref Range Status  ? Yeast Wet Prep HPF POC NONE SEEN NONE SEEN Final  ? Trich, Wet Prep NONE SEEN NONE SEEN Final  ? Clue Cells Wet Prep HPF POC NONE SEEN NONE SEEN Final  ? WBC, Wet Prep HPF POC <10 <10 Final  ? Sperm NONE SEEN  Final  ?  Comment: Performed at [facility], [address], [place], [place] [address]  ?Resp Panel by RT-PCR (Flu A&B, Covid) Nasopharyngeal Swab     Status: None  ? Collection Time: 06/05/21 12:11 AM  ? Specimen: Nasopharyngeal Swab; Nasopharyngeal(NP) swabs in vial transport medium  ?Result Value Ref Range Status  ? SARS Coronavirus 2 by RT PCR NEGATIVE NEGATIVE Final  ?  Comment: (NOTE) ?SARS-CoV-2 target nucleic acids are NOT DETECTED. ? ?The SARS-CoV-2 RNA is generally detectable in upper respiratory ?specimens during the acute phase of infection. The lowest ?concentration of SARS-CoV-2 viral copies this assay can detect is ?138 copies/mL. A negative result does not preclude SARS-Cov-2 ?infection and should not be used as the sole basis for treatment or ?other patient management decisions. A negative result may occur with  ?improper specimen collection/handling, submission of specimen other ?than nasopharyngeal swab, presence of viral mutation(s) within the ?areas targeted by this assay, and inadequate number of viral ?copies(<138 copies/mL). A negative result must be combined with ?clinical observations, patient history, and epidemiological ?information. The expected result is Negative. ? ?Fact Sheet for Patients:  ?EntrepreneurPulse.com.au ? ?Fact Sheet for Healthcare Providers:  ?IncredibleEmployment.be ? ?This test is no t yet approved or cleared by the [place] FDA and  ?has been authorized for detection and/or diagnosis of SARS-CoV-2 by ?FDA under an Emergency Use Authorization (EUA). This EUA will remain  ?in effect (meaning this  test can be used) for the duration of the ?COVID-19 declaration under Section 564(b)(1) of the Act, 21 ?U.S.C.section 360bbb-3(b)(1), unless the authorization is terminated  ?or revoked sooner.  ? ? ?  ? Influenza A by [facility]

## 2021-06-08 NOTE — Assessment & Plan Note (Signed)
Mild with mildly low free T4. Checked in acute illness and has no specific hypothyroid symptoms, so recommend rechecking after acute illness is over. ?

## 2021-06-09 LAB — BASIC METABOLIC PANEL
Anion gap: 8 (ref 5–15)
BUN: 12 mg/dL (ref 6–20)
CO2: 37 mmol/L — ABNORMAL HIGH (ref 22–32)
Calcium: 8.7 mg/dL — ABNORMAL LOW (ref 8.9–10.3)
Chloride: 93 mmol/L — ABNORMAL LOW (ref 98–111)
Creatinine, Ser: 0.86 mg/dL (ref 0.44–1.00)
GFR, Estimated: >60 mL/min (ref >60)
Glucose, Bld: 96 mg/dL (ref 70–99)
Potassium: 3.5 mmol/L (ref 3.5–5.1)
Sodium: 138 mmol/L (ref 135–145)

## 2021-06-09 NOTE — Progress Notes (Signed)
1900 Patient refused to ambulate with staff during shift today. Agreed at 1900 to ambulate. Patient ambulated independently with NT monitoring O2 sats, on room air. O2 sats on room air 95% and higher, when ambulating in the hall. Steady, slow gait with ambulation. Patients oxygen saturation decreased when in the room and required oxygen, dropping below 90%.  ?

## 2021-06-09 NOTE — Progress Notes (Signed)
Mobility Specialist Progress Note ? ? 06/09/21 1800  ?Mobility  ?Activity Refused mobility  ? ?Pt c/o being tired. After max encouragement pt claiming that they will just leave because "we are not taking care of the main issue here and are worried about the wrong things". Will have MS f/u on Monday if pt is still present. ? ?Holland Falling ?Mobility Specialist ?Phone Number [phone] ? ?

## 2021-06-09 NOTE — TOC Progression Note (Addendum)
Transition of Care (TOC) - Progression Note  ? ? ?Patient Details  ?Name: Renee Paul ?MRN: [id number] ?Date of Birth: 12/11/1985 ? ?Transition of Care (TOC) CM/SW Contact  ?Konrad Penta, RN ?Phone Number: [phone] ?06/09/2021, 10:25 AM ? ?Clinical Narrative:  Spoke with [name] at bedside to discuss TOC needs. Confirms she and her family live in [place] hotel for now. She has been looking for housing but has been unsuccessful due to recent eviction on credit report. [name] has been provided additional resources to contact regarding housing options. States she will continue to follow up.  ? ?[name] confirms she wears home oxygen thru [employer]. Jermaine with [employer] contacted on 06/09/21 for portable oxygen for [name] for when she transitions home.  ? ?[name] states she could benefit from shower chair but due to her living situation, she does not want any other equipment to have to "lug around." ? ?[name] states she will have transportation home upon hospital discharge tomorrow. States her daughter will pick her up.  ? ?No further TOC needs identified.  ? ? ? ?Expected Discharge Plan: Home/Self Care ?Barriers to Discharge: Continued Medical Work up ? ?Expected Discharge Plan and Services ?Expected Discharge Plan: Home/Self Care ?  ?Discharge Planning Services: CM Consult ?  ?Living arrangements for the past 2 months: Hotel/Motel ?Expected Discharge Date: 06/10/21              ?  ?  ?  ?  ?  ?  ?  ?  ?  ?  ? ? ?Social Determinants of Health (SDOH) Interventions ?  ? ?Readmission Risk Interventions ?No flowsheet data found. ? ?

## 2021-06-09 NOTE — Progress Notes (Signed)
?Progress Note ? ?PatientGerilyn Paul CWC:[id number] DOB: July 04, 1985  ?DOA: 06/04/2021  DOS: 06/09/2021  ?  ?Brief hospital course: ?Renee Paul is a 36 y.o. African-American female with medical history significant for asthma, CHF and obesity, who presented to the emergency room with acute onset of increased vaginal bleeding after her IUD fell out. The patient had a hysteroscopy on 3/2 by her GYN doctor at [facility] and had an IUD inserted as she has been having bleeding for almost a year. She was seen in at [facility] regional hospital on Saturday night for vaginal discharge and an abdominal and pelvic CT scan done revealed that her IUD was out of place. ?  ?She admits to cough as well as occasional wheezing and dyspnea with associated orthopnea, dyspnea on exertion and paroxysmal nocturnal dyspnea with lower extremity edema.  She stopped taking her Lasix about a week ago as she did not want to run frequently to the bathroom. ? ?Assessment and Plan: ?Acute on chronic diastolic CHF (congestive heart failure) ([place]) ?Clinically volume status is difficult to determine, though weight continues steady decline 215kg > 213 > 210 > 206.7kg > 205kg. Echo limited due to habitus, though LVEF appeared preserved, IVC is dilated and blunted, unclear diastolic function.  ?- Cr stable, UOP almost 4L yesterday, net down 8L for admission. Down 215kg >> 205kg (another kg over past 24 hours).  ?- Continue lasix '80mg'$  IV TID.  ?- She is aware that oral daily diuretic will be recommended at discharge, likely necessary to prevent readmission/worsening respiratory failure ?- Monitor I/O, weights, and BMP. ? ?Acute respiratory failure with hypoxia ([place]) ?Wean oxygen as tolerated. Pt actually is supposed to use oxygen chronically. Will optimize volume status and ambulate to quantify chronic component of hypoxia. ? ?Abnormal uterine bleeding ?Bleeding improving/decreasing. ?- Continue megace '40mg'$  po BID at least until follow up  with gyn as outpatient next week. OB/GYN consulted and evaluated the patient 3/14, 3/16. IUD may continue to be best option for the patient who is at very high periprocedural risk. ? ?Asthma ?- No current exacerbation. ?- Albuterol nebulizer as needed. ? ?OSA (obstructive sleep apnea) ?Declines CPAP. Has definite nocturnal desaturations. ? ?Elevated TSH ?Mild with mildly low free T4. Checked in acute illness and has no specific hypothyroid symptoms, so recommend rechecking after acute illness is over. ? ?Acute blood loss anemia ?- Recheck H/H reveals stability. ?- Continue oral iron. Give a dose of IV iron here w/ferritin and iron low normal, elevated TIBC, 7% Tsat. ? ?Morbid obesity ([place]) ?Body mass index is 75.83 kg/m?.  ?This, along with poor SDOH profile, is primary driver of comorbidities at this time.  ? ?Subjective: She's been dead set on leaving. Was going to leave AMA yesterday, reconsidered. Wanted to leave today but she continues to be significantly short of breath with exertion and refusing oxygen despite this and recorded hypoxia. Has been urinating significantly.  ? ?She's severely stressed by uncertain housing situation, wants to leave to be there for her family. We called her family and they helped me convince her to stay.  ? ?Objective: ?Vitals:  ? 06/08/21 1411 06/08/21 2129 06/09/21 0545 06/09/21 0629  ?BP: 124/75 133/69 120/73   ?Pulse: 76 76 78 69  ?Resp: 17  18   ?Temp: 98.4 ?F (36.9 ?C) 98.8 ?F (37.1 ?C) 98.7 ?F (37.1 ?C)   ?TempSrc: Oral Oral Oral   ?SpO2: 96% 94% 94% 99%  ?Weight:    (!) 205.7 kg  ?Height:      ? ?  Gen: 36 y.o. female in no distress ?Pulm: Nonlabored with supplemental oxygen, distant without crackles. ?CV: Regular rate and rhythm. No murmur, rub, or gallop. No definite JVD, nonpitting dependent edema. ?GI: Abdomen soft, non-tender, non-distended, with normoactive bowel sounds.  ?Ext: Warm, no deformities ?Skin: No rashes, lesions or ulcers on visualized skin. ?Neuro: Alert  and oriented. No focal neurological deficits. ?Psych: Judgement and insight appear fair. Mood euthymic & affect congruent. Behavior is appropriate.   ? ?Data Personally reviewed: ? ?CBC: ?Recent Labs  ?Lab 06/04/21 ?2220 06/04/21 ?2221 06/05/21 ?4270 06/06/21 ?6237 06/08/21 ?0303  ?WBC  --  5.4 5.8 5.4  --   ?NEUTROABS  --  3.7  --   --   --   ?HGB 12.9 11.2* 10.9* 10.5* 11.9*  ?HCT 38.0 40.1 38.4 36.6 39.6  ?MCV  --  97.3 95.5 96.1  --   ?PLT  --  264 271 189  --   ? ?Basic Metabolic Panel: ?Recent Labs  ?Lab 06/05/21 ?6283 06/06/21 ?1517 06/07/21 ?6160 06/08/21 ?0303 06/09/21 ?7371  ?NA 139 137 135 137 138  ?K 4.3 4.3 3.8 3.9 3.5  ?CL 100 94* 91* 91* 93*  ?CO2 33* 35* 32 33* 37*  ?GLUCOSE 133* 103* 97 95 96  ?BUN '7 9 12 10 12  '$ ?CREATININE 0.93 0.82 0.66 0.68 0.86  ?CALCIUM 8.4* 8.4* 8.3* 8.8* 8.7*  ? ?GFR: ?Estimated Creatinine Clearance: 164.7 mL/min (by C-G formula based on SCr of 0.86 mg/dL). ?Liver Function Tests: ?Recent Labs  ?Lab 06/04/21 ?2155  ?AST 40  ?ALT 12  ?ALKPHOS 47  ?BILITOT 2.4*  ?PROT 7.5  ?ALBUMIN 3.3*  ? ?No results for input(s): LIPASE, AMYLASE in the last 168 hours. ?No results for input(s): AMMONIA in the last 168 hours. ?Coagulation Profile: ?No results for input(s): INR, PROTIME in the last 168 hours. ?Cardiac Enzymes: ?No results for input(s): CKTOTAL, CKMB, CKMBINDEX, TROPONINI in the last 168 hours. ?BNP (last 3 results) ?No results for input(s): PROBNP in the last 8760 hours. ?HbA1C: ?No results for input(s): HGBA1C in the last 72 hours. ?CBG: ?No results for input(s): GLUCAP in the last 168 hours. ?Lipid Profile: ?No results for input(s): CHOL, HDL, LDLCALC, TRIG, CHOLHDL, LDLDIRECT in the last 72 hours. ?Thyroid Function Tests: ?Recent Labs  ?  06/07/21 ?0348  ?FREET4 0.59*  ? ?Anemia Panel: ?Recent Labs  ?  06/08/21 ?0303  ?VITAMINB12 256  ?FOLATE 11.4  ?FERRITIN 16  ?TIBC 462*  ?IRON 33  ? ?Urine analysis: ?   ?Component Value Date/Time  ? [place] YELLOW 06/05/2021 0416  ?  APPEARANCEUR CLEAR 06/05/2021 0416  ? LABSPEC 1.014 06/05/2021 0416  ? PHURINE 7.0 06/05/2021 0416  ? GLUCOSEU NEGATIVE 06/05/2021 0416  ? HGBUR LARGE (A) 06/05/2021 0416  ? [place] NEGATIVE 06/05/2021 0416  ? [place] NEGATIVE 06/05/2021 0416  ? [place] NEGATIVE 06/05/2021 0416  ? NITRITE NEGATIVE 06/05/2021 0416  ? LEUKOCYTESUR NEGATIVE 06/05/2021 0416  ? ?Recent Results (from the past 240 hour(s))  ?Wet prep, genital     Status: None  ? Collection Time: 06/04/21 10:25 PM  ? Specimen: Nasopharyngeal Swab  ?Result Value Ref Range Status  ? Yeast Wet Prep HPF POC NONE SEEN NONE SEEN Final  ? Trich, Wet Prep NONE SEEN NONE SEEN Final  ? Clue Cells Wet Prep HPF POC NONE SEEN NONE SEEN Final  ? WBC, Wet Prep HPF POC <10 <10 Final  ? Sperm NONE SEEN  Final  ?  Comment: Performed at [facility],  [address] [place], [place] [address]  ?Resp Panel by RT-PCR (Flu A&B, Covid) Nasopharyngeal Swab     Status: None  ? Collection Time: 06/05/21 12:11 AM  ? Specimen: Nasopharyngeal Swab; Nasopharyngeal(NP) swabs in vial transport medium  ?Result Value Ref Range Status  ? SARS Coronavirus 2 by RT PCR NEGATIVE NEGATIVE Final  ?  Comment: (NOTE) ?SARS-CoV-2 target nucleic acids are NOT DETECTED. ? ?The SARS-CoV-2 RNA is generally detectable in upper respiratory ?specimens during the acute phase of infection. The lowest ?concentration of SARS-CoV-2 viral copies this assay can detect is ?138 copies/mL. A negative result does not preclude SARS-Cov-2 ?infection and should not be used as the sole basis for treatment or ?other patient management decisions. A negative result may occur with  ?improper specimen collection/handling, submission of specimen other ?than nasopharyngeal swab, presence of viral mutation(s) within the ?areas targeted by this assay, and inadequate number of viral ?copies(<138 copies/mL). A negative result must be combined with ?clinical observations, patient history, and  epidemiological ?information. The expected result is Negative. ? ?Fact Sheet for Patients:  ?EntrepreneurPulse.com.au ? ?Fact Sheet for Healthcare Providers:  ?IncredibleEmployment.be ? ?This test

## 2021-06-10 DIAGNOSIS — R7989 Other specified abnormal findings of blood chemistry: Secondary | ICD-10-CM

## 2021-06-10 DIAGNOSIS — D62 Acute posthemorrhagic anemia: Secondary | ICD-10-CM

## 2021-06-10 LAB — BASIC METABOLIC PANEL
Anion gap: 10 (ref 5–15)
BUN: 12 mg/dL (ref 6–20)
CO2: 33 mmol/L — ABNORMAL HIGH (ref 22–32)
Calcium: 8.5 mg/dL — ABNORMAL LOW (ref 8.9–10.3)
Chloride: 93 mmol/L — ABNORMAL LOW (ref 98–111)
Creatinine, Ser: 0.82 mg/dL (ref 0.44–1.00)
GFR, Estimated: >60 mL/min (ref >60)
Glucose, Bld: 95 mg/dL (ref 70–99)
Potassium: 3.4 mmol/L — ABNORMAL LOW (ref 3.5–5.1)
Sodium: 136 mmol/L (ref 135–145)

## 2021-06-10 MED ORDER — MEGESTROL ACETATE 40 MG PO TABS: 40.0000 mg | ORAL_TABLET | Freq: Two times a day (BID) | ORAL | 0 refills | Status: DC

## 2021-06-10 MED ORDER — FERROUS SULFATE 325 (65 FE) MG PO TABS: 325.0000 mg | ORAL_TABLET | Freq: Every day | ORAL | 1 refills | Status: DC

## 2021-06-10 MED ORDER — FUROSEMIDE 40 MG PO TABS: 80.0000 mg | ORAL_TABLET | Freq: Every day | ORAL | 1 refills | Status: DC

## 2021-06-10 MED ORDER — POTASSIUM CHLORIDE CRYS ER 20 MEQ PO TBCR
20.0000 meq | EXTENDED_RELEASE_TABLET | Freq: Once | ORAL | Status: AC
  Administered 2021-06-10: 20 meq via ORAL
  Filled 2021-06-10: qty 1

## 2021-06-10 NOTE — Discharge Summary (Signed)
?Physician Discharge Summary ?  ?Patient: Renee Paul MRN: [id number] DOB: March 10, 1986  ?Admit date:     06/04/2021  ?Discharge date: 06/10/21  ?Discharge Physician: Patrecia Pour  ? ?PCP: Medicine, [place]  ? ?Recommendations at discharge:  ?Follow up with gynecology, Dr. Gwenlyn Found for definitive management of AUB. Discharged on megace '40mg'$  BID pending this follow up.  ?Follow up with PCP and/or cardiology for continued monitoring of BMP, CBC, and volume status. Discharged on lasix '80mg'$  oral daily.  ?Recheck Cr, K, Hgb, and thyroid function tests at follow up. ? ?Discharge Diagnoses: ?Active Problems: ?  Acute on chronic diastolic CHF (congestive heart failure) ([place]) ?  Acute respiratory failure with hypoxia ([place]) ?  Abnormal uterine bleeding ?  Asthma ?  OSA (obstructive sleep apnea) ?  Chronic respiratory failure with hypoxia and hypercapnia (H[facility]) ?  Morbid obesity ([place]) ?  Acute blood loss anemia ?  Elevated TSH ? ?Hospital Course: ?Renee Paul is a 36 y.o. African-American female with medical history significant for asthma, CHF and obesity, who presented to the emergency room with acute onset of increased vaginal bleeding after her IUD fell out. The patient had a hysteroscopy on 3/2 by her GYN doctor at [facility] and had an IUD inserted as she has been having bleeding for almost a year. She was seen in at [facility] hospital on Saturday night for vaginal discharge and an abdominal and pelvic CT scan done revealed that her IUD was out of place. ?  ?She admits to cough as well as occasional wheezing and dyspnea with associated orthopnea, dyspnea on exertion and paroxysmal nocturnal dyspnea with lower extremity edema.  She stopped taking her Lasix about a week ago as she did not want to run frequently to the bathroom. ? ?The patient was admitted and IV diuresis was augmented with good urine output and volume status. She is well below her previous dry weight, though  likely has more volume to lose. She deals with a very difficult socioeconomic situation that impacts her health regularly, and has requested discharge for several days as she is needed to support her family. Having significantly improved mobility, albeit with supplemental oxygen (which she was said to require PTA), she is stable for a safe discharge at this time.  ? ?Assessment and Plan: ?Acute on chronic diastolic CHF (congestive heart failure) ([place]) ?Clinically volume status is difficult to determine, though weight continues steady decline 215kg > 213 > 210 > 206.7kg > 205kg. Echo limited due to habitus, though LVEF appeared preserved, IVC was dilated and blunted, unclear diastolic function.  ?- Cr stable, UOP another ~3L yesterday. Offered my recommendation for medical optimization would be continue IV lasix '80mg'$  TID, though pt opts for discharge. Will restart home '80mg'$  po daily, in hopes of increasing adherence, as a compromise between need to keep fluid off and pt's desire to minimize frequent trips to urinate.  ?- She is aware that oral daily diuretic is necessary to prevent readmission/worsening respiratory failure ? ?Acute respiratory failure with hypoxia ([place]) ?Wean oxygen as tolerated. Pt actually is supposed to use oxygen chronically, still requires oxygen with exertion. Despite education that not wearing oxygen when hypoxic, even if tolerable, can lead to pulmonary HTN, etc., she may end up declining this. ? ?Abnormal uterine bleeding ?Bleeding improving/decreasing. ?- Continue megace '40mg'$  po BID at least until follow up with gyn as outpatient next week. OB/GYN consulted and evaluated the patient 3/14, 3/16. IUD may continue to be  best option for the patient who is at very high periprocedural risk. ? ?Asthma ?- No current exacerbation. ?- Albuterol nebulizer as needed. ? ?OSA (obstructive sleep apnea) ?Declines CPAP. Has definite nocturnal desaturations. ? ?Elevated TSH ?Mild with mildly low free T4.  Checked in acute illness and has no specific hypothyroid symptoms, so recommend rechecking after acute illness is over. ? ?Acute blood loss anemia ?- Recheck H/H reveals stability. Recommend recheck at follow up. ?- Continue oral iron. Give a dose of IV iron here w/ferritin and iron low normal, elevated TIBC, 7% Tsat. ? ?Morbid obesity ([place]) ?Body mass index is 75.83 kg/m?.  ?This, along with poor SDOH profile, is primary driver of comorbidities at this time.  ? ?Consultants: GYN ?Procedures performed: None  ?Disposition: Home ?Diet recommendation:  ?Discharge Diet Orders (From admission, onward)  ? ?  Start     Ordered  ? 06/10/21 0000  Diet - low sodium heart healthy       ? 06/10/21 0835  ? ?  ?  ? ?  ? ?Cardiac diet ?DISCHARGE MEDICATION: ?Allergies as of 06/10/2021   ?No Known Allergies ?  ? ?  ?Medication List  ?  ? ?TAKE these medications   ? ?albuterol 108 (90 Base) MCG/ACT inhaler ?Commonly known as: VENTOLIN HFA ?Inhale 2 puffs into the lungs every 4 (four) hours as needed for wheezing or shortness of breath. ?  ?ferrous sulfate 325 (65 FE) MG tablet ?Take 1 tablet (325 mg total) by mouth daily with breakfast. ?  ?furosemide 40 MG tablet ?Commonly known as: LASIX ?Take 2 tablets (80 mg total) by mouth daily. ?  ?megestrol 40 MG tablet ?Commonly known as: MEGACE ?Take 1 tablet (40 mg total) by mouth 2 (two) times daily. ?  ?vitamin C 1000 MG tablet ?Take 1,000 mg by mouth daily. ?  ? ?  ? ?  ?  ? ? ?  ?Durable Medical Equipment  ?(From admission, onward)  ?  ? ? ?  ? ?  Start     Ordered  ? 06/10/21 0000  For home use only DME oxygen       ?Question Answer Comment  ?Length of Need 6 Months   ?Mode or (Route) Nasal cannula   ?Liters per Minute 2   ?Frequency Continuous (stationary and portable oxygen unit needed)   ?Oxygen delivery system Gas   ?  ? 06/10/21 0835  ? ?  ?  ? ?  ? ? Follow-up Information   ? ? Medicine, [facility] Family Follow up.   ?Specialty: Family Medicine ? ?  ?  ? ?  Ovidio Hanger, MD Follow up.   ?Specialty: Obstetrics and Gynecology ?Contact information: ?[address] ?[place] [address] ?[phone] ? ? ?  ?  ? ?  ?  ? ?  ? ?Subjective: Feels she's breathing better and has to leave today. Her family's stay at hotel concludes today. No chest pain.  ? ?Discharge Exam: ?Filed Weights  ? 06/07/21 0443 06/08/21 0538 06/09/21 0629  ?Weight: (!) 210.1 kg (!) 206.7 kg (!) 205.7 kg  ?Obese in no distress. Respirations even and nonlabored. Distant without crackles. RRR. Habitus complicates volume assessment, though legs have significantly less girth than prior exams. ? ?Condition at discharge:  Stable for safe discharge, though not medically optimized. ? ?The results of significant diagnostics from this hospitalization (including imaging, microbiology, ancillary and laboratory) are listed below for reference.  ? ?Imaging Studies: ?DG Chest Portable 1 View ? ?  Result Date: 06/04/2021 ?CLINICAL DATA:  Shortness of breath EXAM: PORTABLE CHEST 1 VIEW COMPARISON:  03/21/2021 FINDINGS: Underpenetrated AP portable examination. Cardiomegaly. Bandlike scarring and or atelectasis without acute appearing airspace opacity. The visualized skeletal structures are unremarkable. IMPRESSION: Cardiomegaly. Bandlike scarring and or atelectasis without acute appearing airspace opacity on underpenetrated AP portable examination. Electronically Signed   By: Delanna Ahmadi M.D.   On: 06/04/2021 20:15  ? ?ECHOCARDIOGRAM COMPLETE ? ?Result Date: 06/05/2021 ?   ECHOCARDIOGRAM REPORT   Patient Name:   Renee Paul Date of Exam: 06/05/2021 Medical Rec #:  [id number]       Height:       65.0 in Accession #:    [id number]      Weight:       474.4 lb Date of Birth:  1985/09/25       BSA:          2.855 m? Patient Age:    36 years        BP:           140/79 mmHg Patient Gender: F               HR:           78 bpm. Exam Location:  Inpatient Procedure: 2D Echo and Intracardiac Opacification Agent  Indications:    Acute diastolic CHF  History:        Patient has no prior history of Echocardiogram examinations.                 CHF.  Sonographer:    Arlyss Gandy Referring Phys: [id number] [place]

## 2021-06-10 NOTE — TOC Transition Note (Signed)
Transition of Care (TOC) - CM/SW Discharge Note ? ? ?Patient Details  ?Name: Renee Paul ?MRN: [id number] ?Date of Birth: 08/25/1985 ? ?Transition of Care (TOC) CM/SW Contact:  ?Pollie Friar, RN ?Phone Number: ?06/10/2021, 2:18 PM ? ? ?Clinical Narrative:    ?Patient is discharging home with self care. [employer] delivered an oxygen tank to the bedside for transport home. Patient is not sure she is going to take the tank home as she doesn't want to be responsible for the tank and not sure it will fit in her car. She wants to see when her spouse arrives. If not CM has asked the bedside RN to keep the tank and label it with patients information for the DME company to pick up tomorrow.  ? ? ? ?Final next level of care: Other (comment) ([employer]) ?Barriers to Discharge: No Barriers Identified ? ? ?Patient Goals and CMS Choice ?  ?  ?  ? ?Discharge Placement ?  ?           ?  ?  ?  ?  ? ?Discharge Plan and Services ?  ?Discharge Planning Services: CM Consult ?           ?  ?  ?  ?  ?  ?  ?  ?  ?  ?  ? ?Social Determinants of Health (SDOH) Interventions ?  ? ? ?Readmission Risk Interventions ?No flowsheet data found. ? ? ? ? ?

## 2021-06-18 ENCOUNTER — Encounter (HOSPITAL_COMMUNITY): Payer: No Typology Code available for payment source

## 2021-06-18 ENCOUNTER — Telehealth (HOSPITAL_COMMUNITY): Payer: Self-pay | Admitting: *Deleted

## 2021-06-18 NOTE — Telephone Encounter (Signed)
Heart Failure Nurse Navigator Progress Note  ? ?Called patient for appointment reminder.Stated she wanted to cancel, she had to go back to work, didn't want to reschedule. Encourage her to follow-up with her PCP.  ? ?Earnestine Leys, BSN, RN ?Heart Failure Nurse Navigator ?[phone]  ?

## 2021-10-22 ENCOUNTER — Emergency Department (HOSPITAL_BASED_OUTPATIENT_CLINIC_OR_DEPARTMENT_OTHER): Payer: No Typology Code available for payment source

## 2021-10-22 ENCOUNTER — Encounter (HOSPITAL_BASED_OUTPATIENT_CLINIC_OR_DEPARTMENT_OTHER): Payer: Self-pay

## 2021-10-22 ENCOUNTER — Inpatient Hospital Stay (HOSPITAL_BASED_OUTPATIENT_CLINIC_OR_DEPARTMENT_OTHER)
Admission: EM | Admit: 2021-10-22 | Discharge: 2021-10-27 | DRG: 291 | Disposition: A | Discharge status: Home Health | Payer: No Typology Code available for payment source | Attending: Internal Medicine | Admitting: Internal Medicine

## 2021-10-22 ENCOUNTER — Other Ambulatory Visit: Payer: Self-pay

## 2021-10-22 DIAGNOSIS — J9621 Acute and chronic respiratory failure with hypoxia: Secondary | ICD-10-CM | POA: Diagnosis present

## 2021-10-22 DIAGNOSIS — I509 Heart failure, unspecified: Secondary | ICD-10-CM

## 2021-10-22 DIAGNOSIS — J45901 Unspecified asthma with (acute) exacerbation: Secondary | ICD-10-CM | POA: Diagnosis present

## 2021-10-22 DIAGNOSIS — I110 Hypertensive heart disease with heart failure: Principal | ICD-10-CM | POA: Diagnosis present

## 2021-10-22 DIAGNOSIS — B9789 Other viral agents as the cause of diseases classified elsewhere: Secondary | ICD-10-CM | POA: Diagnosis present

## 2021-10-22 DIAGNOSIS — F17210 Nicotine dependence, cigarettes, uncomplicated: Secondary | ICD-10-CM | POA: Diagnosis present

## 2021-10-22 DIAGNOSIS — Z6841 Body Mass Index (BMI) 40.0 and over, adult: Secondary | ICD-10-CM | POA: Diagnosis not present

## 2021-10-22 DIAGNOSIS — J810 Acute pulmonary edema: Secondary | ICD-10-CM | POA: Diagnosis present

## 2021-10-22 DIAGNOSIS — J45909 Unspecified asthma, uncomplicated: Secondary | ICD-10-CM | POA: Diagnosis not present

## 2021-10-22 DIAGNOSIS — Z5900 Homelessness unspecified: Secondary | ICD-10-CM | POA: Diagnosis not present

## 2021-10-22 DIAGNOSIS — I5033 Acute on chronic diastolic (congestive) heart failure: Secondary | ICD-10-CM | POA: Diagnosis present

## 2021-10-22 DIAGNOSIS — D509 Iron deficiency anemia, unspecified: Secondary | ICD-10-CM | POA: Diagnosis present

## 2021-10-22 DIAGNOSIS — Z20822 Contact with and (suspected) exposure to covid-19: Secondary | ICD-10-CM | POA: Diagnosis present

## 2021-10-22 DIAGNOSIS — Z91148 Patient's other noncompliance with medication regimen for other reason: Secondary | ICD-10-CM | POA: Diagnosis not present

## 2021-10-22 LAB — CBC WITH DIFFERENTIAL/PLATELET
Abs Immature Granulocytes: 0.04 10*3/uL (ref 0.00–0.07)
Basophils Absolute: 0 10*3/uL (ref 0.0–0.1)
Basophils Relative: 0 %
Eosinophils Absolute: 0.2 10*3/uL (ref 0.0–0.5)
Eosinophils Relative: 3 %
HCT: 43.6 % (ref 36.0–46.0)
Hemoglobin: 12.9 g/dL (ref 12.0–15.0)
Immature Granulocytes: 1 %
Lymphocytes Relative: 19 %
Lymphs Abs: 1.3 10*3/uL (ref 0.7–4.0)
MCH: 27.4 pg (ref 26.0–34.0)
MCHC: 29.6 g/dL — ABNORMAL LOW (ref 30.0–36.0)
MCV: 92.8 fL (ref 80.0–100.0)
Monocytes Absolute: 0.3 10*3/uL (ref 0.1–1.0)
Monocytes Relative: 5 %
Neutro Abs: 5.1 10*3/uL (ref 1.7–7.7)
Neutrophils Relative %: 72 %
Platelets: 275 10*3/uL (ref 150–400)
RBC: 4.7 MIL/uL (ref 3.87–5.11)
RDW: 16.6 % — ABNORMAL HIGH (ref 11.5–15.5)
WBC: 7 10*3/uL (ref 4.0–10.5)
nRBC: 0.4 % — ABNORMAL HIGH (ref 0.0–0.2)

## 2021-10-22 LAB — COMPREHENSIVE METABOLIC PANEL
ALT: 18 U/L (ref 0–44)
AST: 25 U/L (ref 15–41)
Albumin: 3.1 g/dL — ABNORMAL LOW (ref 3.5–5.0)
Alkaline Phosphatase: 50 U/L (ref 38–126)
Anion gap: 7 (ref 5–15)
BUN: 13 mg/dL (ref 6–20)
CO2: 29 mmol/L (ref 22–32)
Calcium: 8.2 mg/dL — ABNORMAL LOW (ref 8.9–10.3)
Chloride: 100 mmol/L (ref 98–111)
Creatinine, Ser: 0.82 mg/dL (ref 0.44–1.00)
GFR, Estimated: >60 mL/min (ref >60)
Glucose, Bld: 167 mg/dL — ABNORMAL HIGH (ref 70–99)
Potassium: 4.1 mmol/L (ref 3.5–5.1)
Sodium: 136 mmol/L (ref 135–145)
Total Bilirubin: 1.1 mg/dL (ref 0.3–1.2)
Total Protein: 7.9 g/dL (ref 6.5–8.1)

## 2021-10-22 LAB — TROPONIN I (HIGH SENSITIVITY)
Troponin I (High Sensitivity): 13 ng/L (ref <18)
Troponin I (High Sensitivity): 11 ng/L (ref <18)

## 2021-10-22 LAB — BRAIN NATRIURETIC PEPTIDE: B Natriuretic Peptide: 135.7 pg/mL — ABNORMAL HIGH (ref 0.0–100.0)

## 2021-10-22 LAB — LIPASE, BLOOD: Lipase: 49 U/L (ref 11–51)

## 2021-10-22 LAB — HCG, SERUM, QUALITATIVE: Preg, Serum: NEGATIVE

## 2021-10-22 MED ORDER — GUAIFENESIN ER 600 MG PO TB12
1200.0000 mg | ORAL_TABLET | Freq: Two times a day (BID) | ORAL | Status: DC
  Administered 2021-10-22 – 2021-10-27 (×10): 1200 mg via ORAL
  Filled 2021-10-22 (×11): qty 2

## 2021-10-22 MED ORDER — ALBUTEROL SULFATE (2.5 MG/3ML) 0.083% IN NEBU
2.5000 mg | INHALATION_SOLUTION | Freq: Once | RESPIRATORY_TRACT | Status: AC
  Administered 2021-10-22: 2.5 mg via RESPIRATORY_TRACT

## 2021-10-22 MED ORDER — SODIUM CHLORIDE 0.9 % IV SOLN
250.0000 mL | INTRAVENOUS | Status: DC | PRN
Start: 2021-10-22 — End: 2021-10-27

## 2021-10-22 MED ORDER — NICOTINE 21 MG/24HR TD PT24
21.0000 mg | MEDICATED_PATCH | Freq: Every day | TRANSDERMAL | Status: DC
  Administered 2021-10-22 – 2021-10-26 (×3): 21 mg via TRANSDERMAL
  Filled 2021-10-22 (×4): qty 1

## 2021-10-22 MED ORDER — SODIUM CHLORIDE 0.9% FLUSH: 3.0000 mL | INTRAVENOUS | Status: DC | PRN

## 2021-10-22 MED ORDER — MEGESTROL ACETATE 40 MG PO TABS
40.0000 mg | ORAL_TABLET | Freq: Two times a day (BID) | ORAL | Status: DC
  Administered 2021-10-22 – 2021-10-27 (×10): 40 mg via ORAL
  Filled 2021-10-22 (×12): qty 1

## 2021-10-22 MED ORDER — FERROUS SULFATE 325 (65 FE) MG PO TABS
325.0000 mg | ORAL_TABLET | Freq: Every day | ORAL | Status: DC
  Administered 2021-10-23 – 2021-10-27 (×5): 325 mg via ORAL
  Filled 2021-10-22 (×5): qty 1

## 2021-10-22 MED ORDER — MELATONIN 5 MG PO TABS
5.0000 mg | ORAL_TABLET | Freq: Every day | ORAL | Status: DC
  Administered 2021-10-22 – 2021-10-26 (×5): 5 mg via ORAL
  Filled 2021-10-22 (×5): qty 1

## 2021-10-22 MED ORDER — IPRATROPIUM BROMIDE HFA 17 MCG/ACT IN AERS: 2.0000 | INHALATION_SPRAY | Freq: Four times a day (QID) | RESPIRATORY_TRACT | Status: DC | PRN

## 2021-10-22 MED ORDER — ENOXAPARIN SODIUM 100 MG/ML IJ SOSY
100.0000 mg | PREFILLED_SYRINGE | INTRAMUSCULAR | Status: DC
  Administered 2021-10-22 – 2021-10-25 (×4): 100 mg via SUBCUTANEOUS
  Filled 2021-10-22 (×5): qty 1

## 2021-10-22 MED ORDER — SODIUM CHLORIDE 0.9% FLUSH
3.0000 mL | Freq: Two times a day (BID) | INTRAVENOUS | Status: DC
Start: 2021-10-22 — End: 2021-10-27
  Administered 2021-10-22 – 2021-10-27 (×9): 3 mL via INTRAVENOUS

## 2021-10-22 MED ORDER — IPRATROPIUM-ALBUTEROL 0.5-2.5 (3) MG/3ML IN SOLN
3.0000 mL | Freq: Four times a day (QID) | RESPIRATORY_TRACT | Status: DC
  Administered 2021-10-22 – 2021-10-23 (×4): 3 mL via RESPIRATORY_TRACT
  Filled 2021-10-22 (×4): qty 3

## 2021-10-22 MED ORDER — ALBUTEROL SULFATE (2.5 MG/3ML) 0.083% IN NEBU: 2.5000 mg | INHALATION_SOLUTION | Freq: Once | RESPIRATORY_TRACT | Status: DC

## 2021-10-22 MED ORDER — ASCORBIC ACID 500 MG PO TABS
1000.0000 mg | ORAL_TABLET | Freq: Every day | ORAL | Status: DC
  Administered 2021-10-22 – 2021-10-27 (×6): 1000 mg via ORAL
  Filled 2021-10-22 (×6): qty 2

## 2021-10-22 MED ORDER — ONDANSETRON HCL 4 MG/2ML IJ SOLN: 4.0000 mg | Freq: Four times a day (QID) | INTRAMUSCULAR | Status: DC | PRN

## 2021-10-22 MED ORDER — MOMETASONE FURO-FORMOTEROL FUM 200-5 MCG/ACT IN AERO
2.0000 | INHALATION_SPRAY | Freq: Two times a day (BID) | RESPIRATORY_TRACT | Status: DC
  Administered 2021-10-23 – 2021-10-24 (×3): 2 via RESPIRATORY_TRACT
  Filled 2021-10-22: qty 8.8

## 2021-10-22 MED ORDER — IPRATROPIUM-ALBUTEROL 0.5-2.5 (3) MG/3ML IN SOLN
3.0000 mL | Freq: Once | RESPIRATORY_TRACT | Status: AC
  Administered 2021-10-22: 3 mL via RESPIRATORY_TRACT

## 2021-10-22 MED ORDER — ENOXAPARIN SODIUM 40 MG/0.4ML IJ SOSY: 40.0000 mg | PREFILLED_SYRINGE | INTRAMUSCULAR | Status: DC

## 2021-10-22 MED ORDER — IPRATROPIUM-ALBUTEROL 0.5-2.5 (3) MG/3ML IN SOLN
RESPIRATORY_TRACT | Status: AC
  Filled 2021-10-22: qty 3

## 2021-10-22 MED ORDER — FUROSEMIDE 10 MG/ML IJ SOLN
60.0000 mg | Freq: Two times a day (BID) | INTRAMUSCULAR | Status: DC
  Administered 2021-10-22 – 2021-10-24 (×4): 60 mg via INTRAVENOUS
  Filled 2021-10-22 (×4): qty 6

## 2021-10-22 MED ORDER — ALBUTEROL (5 MG/ML) CONTINUOUS INHALATION SOLN
INHALATION_SOLUTION | RESPIRATORY_TRACT | Status: AC
  Filled 2021-10-22: qty 0.5

## 2021-10-22 MED ORDER — ACETAMINOPHEN 325 MG PO TABS
650.0000 mg | ORAL_TABLET | ORAL | Status: DC | PRN
  Administered 2021-10-23 (×2): 650 mg via ORAL
  Filled 2021-10-22 (×2): qty 2

## 2021-10-22 MED ORDER — FUROSEMIDE 10 MG/ML IJ SOLN
60.0000 mg | Freq: Once | INTRAMUSCULAR | Status: AC
  Administered 2021-10-22: 60 mg via INTRAVENOUS
  Filled 2021-10-22: qty 6

## 2021-10-22 MED ORDER — IPRATROPIUM BROMIDE 0.02 % IN SOLN: 0.5000 mg | Freq: Four times a day (QID) | RESPIRATORY_TRACT | Status: DC | PRN

## 2021-10-22 NOTE — ED Notes (Signed)
States supposed to wear O2 via [place] at home, didn't have any, SpO2 78% on RA

## 2021-10-22 NOTE — Progress Notes (Signed)
Patient arrived to room 3E07 in NAD, VS stable. Patient oriented to room and call bell in reach.

## 2021-10-22 NOTE — ED Triage Notes (Signed)
States has been having cough, SOB, nausea, chest tightness x 3 days. Hx of asthma. Hasnt taken inhalers. Hx CHF, states hasnt taken diuretic x 1 week

## 2021-10-22 NOTE — ED Notes (Signed)
Report given to Sam RN, receiving nurse at [facility]

## 2021-10-22 NOTE — Progress Notes (Signed)
Plan of Care Note for accepted transfer   Patient: Renee Paul MRN: [id number]   [place]: 10/22/2021  Facility requesting transfer: [facility] Requesting Provider: Humphrey Rolls Reason for transfer: Acute on chronic diastolic CHF Facility course: Patient with h/o chronic respiratory failure associated with OHS/OSA, chronic diastolic CHF, and super morbid obesity (BMI 81) who was recently admitted (7/18-20) at [facility] for acute on chronic hypercarbic respiratory failure.  She was "temporarily homeless" and lost her oxygen tank.  She returned to the hospital from 7/2-24 for medication non-compliance; she reported ongoing homelessness, living out of her car.  She returned today because she still has not gotten her medications and has had SOB, CP, and orthopnea.  Patient is volume overloaded.  Multiple recent hospitalizations in [place], not taking medications due to "unstable housing."  CXR with pulm edema, has increased O2 requirement.   Plan of care: The patient is accepted for admission to Telemetry unit, at [facility].   Author: Karmen Bongo, MD 10/22/2021  Check [email or web address] for on-call coverage.  Nursing staff, Please call [place] number on Amion as soon as patient's arrival, so appropriate admitting provider can evaluate the pt.

## 2021-10-22 NOTE — Progress Notes (Signed)
PHARMACIST - PHYSICIAN COMMUNICATION  CONCERNING:  Enoxaparin (Lovenox) for DVT Prophylaxis    RECOMMENDATION: Patient was prescribed enoxaprin '40mg'$  q24 hours for VTE prophylaxis.   Filed Weights   10/22/21 0915 10/22/21 1103 10/22/21 1622  Weight: (!) 210.9 kg (465 lb) (!) 220.8 kg (486 lb 12.8 oz) (!) 213.1 kg (469 lb 12.8 oz)    Body mass index is 83.22 kg/m.  Estimated Creatinine Clearance: 171.4 mL/min (by C-G formula based on SCr of 0.82 mg/dL).   Based on [place] patient is candidate for enoxaparin 0.'5mg'$ /kg TBW SQ every 24 hours based on BMI being >30.  DESCRIPTION: Pharmacy has adjusted enoxaparin dose per [facility] policy.  Patient is now receiving enoxaparin 100 mg every 24 hours    Berta Minor, PharmD Clinical Pharmacist  10/22/2021 5:55 PM

## 2021-10-22 NOTE — Progress Notes (Signed)
RT ordered and albuterol 2.5 and a duoneb. RT had issues with the medicines after overriding the medicine

## 2021-10-22 NOTE — ED Provider Notes (Cosign Needed Addendum)
[facility] DEPART[facility] Provider Note   CSN: [id number] Arrival date & time: 10/22/21  0906     History  Chief Complaint  Patient presents with   Chest Pain   Shortness of Breath    Renee Paul is a 36 y.o. female.  Patient is a 36 year old female with past medical history of HFpEF, class III obesity, untreated OSA, asthma and history of abnormal uterine bleeding presenting to ED with worsening shortness of breath, chest tightness and orthopnea consistent with CHF exacerbation in setting of medication nonadherence.  Patient was recently hospitalized from 07/18-07/20 and 07/23-07/24 for the same reason.  She has been unable to take her medications since leaving the hospital due to her difficult social situation.  She states she is in the process of obtaining stable housing but is current living with a family member.  She has lost all the medications given to her at discharge.  The history is provided by the patient.  Chest Pain Pain location:  Unable to specify Pain quality: tightness   Pain radiates to:  Does not radiate Pain severity:  No pain Onset quality:  Gradual Duration:  5 days Timing:  Intermittent Progression:  Waxing and waning Chronicity:  Recurrent Relieved by:  Certain positions Worsened by:  Certain positions and exertion Associated symptoms: cough, fatigue, lower extremity edema, orthopnea, shortness of breath and syncope   Associated symptoms: no abdominal pain, no back pain, no diaphoresis, no dizziness, no fever and no headache   Risk factors: obesity and smoking   Shortness of Breath Severity:  Moderate Onset quality:  Gradual Duration:  5 days Timing:  Constant Progression:  Waxing and waning Chronicity:  Recurrent Relieved by:  Diuretics, oxygen and rest Worsened by:  Movement and activity Associated symptoms: chest pain, cough and syncope   Associated symptoms: no abdominal pain, no diaphoresis, no fever, no headaches, no  rash and no sore throat   Risk factors: alcohol use, obesity and tobacco use        Home Medications Prior to Admission medications   Medication Sig Start Date End Date Taking? Authorizing Provider  albuterol (PROVENTIL HFA;VENTOLIN HFA) 108 (90 Base) MCG/ACT inhaler Inhale 2 puffs into the lungs every 4 (four) hours as needed for wheezing or shortness of breath. 09/06/16   Orpah Greek, MD  Ascorbic Acid (VITAMIN C) 1000 MG tablet Take 1,000 mg by mouth daily. 09/30/20   [provider]  ferrous sulfate 325 (65 FE) MG tablet Take 1 tablet (325 mg total) by mouth daily with breakfast. 06/10/21   Patrecia Pour, MD  furosemide (LASIX) 40 MG tablet Take 2 tablets (80 mg total) by mouth daily. 06/10/21   Patrecia Pour, MD  megestrol (MEGACE) 40 MG tablet Take 1 tablet (40 mg total) by mouth 2 (two) times daily. 06/10/21   Patrecia Pour, MD      Allergies    Patient has no known allergies.    Review of Systems   Review of Systems  Constitutional:  Positive for fatigue. Negative for diaphoresis and fever.  HENT:  Negative for sinus pressure, sinus pain and sore throat.   Eyes:  Negative for discharge and redness.  Respiratory:  Positive for cough, chest tightness and shortness of breath.   Cardiovascular:  Positive for chest pain, orthopnea, leg swelling and syncope.  Gastrointestinal:  Negative for abdominal pain and constipation.  Endocrine: Negative for cold intolerance and heat intolerance.  Genitourinary:  Negative for difficulty urinating and dysuria.  Musculoskeletal:  Negative for arthralgias and back pain.  Skin:  Negative for rash and wound.  Allergic/Immunologic: Negative for food allergies.  Neurological:  Negative for dizziness and headaches.  Psychiatric/Behavioral:  Negative for agitation, behavioral problems and confusion.     Physical Exam Updated Vital Signs BP (!) 127/93   Pulse 66   Temp 98.4 F (36.9 C) (Oral)   Resp 19   Ht '5\' 5"'$  (1.651 m)   Wt  (!) 220.8 kg   LMP  (LMP Unknown)   SpO2 97%   BMI 81.01 kg/m  Physical Exam: General: significantly obese female in no acute distress HENT:  in place, MMM CV: RRR, distant heart sounds, No JVD appreciated, no pitting edema Resp: bibasilar rales, expiratory wheeze present Abd: Soft, non-distended MSK: No asymmetry, significant edema on legs that is non-pitting Skin: No lesions noted on examined skin Neuro: alert and oriented x4 Psych: normal mood and normal affect  ED Results / Procedures / Treatments   Labs (all labs ordered are listed, but only abnormal results are displayed) Labs Reviewed  BRAIN NATRIURETIC PEPTIDE - Abnormal; Notable for the following components:      Result Value   B Natriuretic Peptide 135.7 (*)    All other components within normal limits  COMPREHENSIVE METABOLIC PANEL - Abnormal; Notable for the following components:   Glucose, Bld 167 (*)    Calcium 8.2 (*)    Albumin 3.1 (*)    All other components within normal limits  CBC WITH DIFFERENTIAL/PLATELET - Abnormal; Notable for the following components:   MCHC 29.6 (*)    RDW 16.6 (*)    nRBC 0.4 (*)    All other components within normal limits  HCG, SERUM, QUALITATIVE  LIPASE, BLOOD  TROPONIN I (HIGH SENSITIVITY)  TROPONIN I (HIGH SENSITIVITY)    EKG EKG Interpretation  Date/Time:  Monday October 22 2021 09:15:27 EDT Ventricular Rate:  97 PR Interval:  151 QRS Duration: 79 QT Interval:  374 QTC Calculation: 476 R Axis:   115 Text Interpretation: Sinus rhythm Consider right atrial enlargement Right axis deviation Abnormal R-wave progression, late transition Confirmed by Nanda Quinton [phone]) on 10/22/2021 9:18:41 AM  Radiology DG Chest Portable 1 View  Result Date: 10/22/2021 CLINICAL DATA:  Provided history: Shortness of breath. Additional history provided: Cough, chest tightness, history of asthma, history of CHF. EXAM: PORTABLE CHEST 1 VIEW COMPARISON:  Prior chest radiographs October 14, 2021 and earlier. FINDINGS: Cardiomegaly with central pulmonary vascular congestion. Mild prominence of the interstitial lung markings within the mid-to-lower lung fields. No evidence of pleural effusion or pneumothorax. No acute bony abnormality identified. IMPRESSION: Cardiomegaly with central pulmonary vascular congestion. Mild prominence of the interstitial lung markings within the mid-to-lower lung fields. This may reflect pulmonary interstitial edema. However, atypical/viral pneumonia can of a similar imaging appearance and clinical correlation is recommended. Electronically Signed   By: Kellie Simmering D.O.   On: 10/22/2021 09:48    Procedures ED EKG  Date/Time: 10/22/2021 10:30 AM  Performed by: Idamae Schuller, MD Authorized by: Tacy Learn, PA-C   Rate:    ECG rate assessment: normal   Rhythm:    Rhythm: sinus rhythm   Ectopy:    Ectopy: none   QRS:    QRS axis:  Normal   QRS intervals:  Normal   QRS conduction: normal   ST segments:    ST segments:  Normal Other findings:    Other findings: prolonged qTc interval  Medications Ordered in ED Medications  ipratropium-albuterol (DUONEB) 0.5-2.5 (3) MG/3ML nebulizer solution (3 mLs  Not Given 10/22/21 0927)  albuterol (VENTOLIN) (5 MG/ML) 0.5% continuous inhalation solution (2.5 mg  Not Given 10/22/21 0928)  albuterol (PROVENTIL) (2.5 MG/3ML) 0.083% nebulizer solution 2.5 mg (2.5 mg Nebulization Not Given 10/22/21 0933)  ipratropium-albuterol (DUONEB) 0.5-2.5 (3) MG/3ML nebulizer solution 3 mL (3 mLs Nebulization Given 10/22/21 0926)  albuterol (PROVENTIL) (2.5 MG/3ML) 0.083% nebulizer solution 2.5 mg (2.5 mg Nebulization Given 10/22/21 0929)  furosemide (LASIX) injection 60 mg (60 mg Intravenous Given 10/22/21 1047)    ED Course/ Medical Decision Making/ A&P                           Medical Decision Making Patient presents with shortness of breath, and chest tightness, and worsening orthopnea.  Symptoms exacerbated CHF  exacerbation in setting of nonadherence to her diuretic regimen.  ACS was considered but ruled out given negative EKG findings and negative serologic data.  Chest x-ray showed pulmonary edema consistent with picture of volume overload.  Lab work overall is reassuring.  BNP is elevated but history is listed limited due to her morbid obesity.  Patient has poor social situation and has required multiple admissions this month.  Plan is to get patient admitted for IV diuresis and have her seen by social work to ensure patient is safe and prevent further admissions. -IV Lasix 60 mg given in the ED, Re-dose Lasix based on response. -Hospitalist consulted for admission  Amount and/or Complexity of Data Reviewed External Data Reviewed: labs, radiology, ECG and notes.    Details: Previous data reviewed and shows pt has poor social situation leading to difficulty with medication adherence leading to repeat admissions. Previous discharge summaries reviewed and ED visits reviewed. Labs: ordered.    Details: CBC, BMP, lipase, troponin and BNP ordered. Radiology: ordered and independent interpretation performed.    Details: CXR oredered and shows cardiomegaly and pulmonary edema. ECG/medicine tests: ordered and independent interpretation performed.    Details: ECG ordered and shows no ischemic changes.  Risk Prescription drug management. Decision regarding hospitalization.           Final Clinical Impression(s) / ED Diagnoses Final diagnoses:  Acute on chronic respiratory failure with hypoxia ([place])  Acute pulmonary edema [facility])    Rx / DC Orders ED Discharge Orders     None         Idamae Schuller, MD 10/22/21 1111    Idamae Schuller, MD 10/22/21 1112    Idamae Schuller, MD 10/22/21 [place], [facility], MD 10/23/21 [phone]

## 2021-10-22 NOTE — H&P (Signed)
History and Physical    Renee Paul SAY:[id number] DOB: March 11, 1986 DOA: 10/22/2021  PCP: [facility], [place] (Confirm with patient/family/NH records and if not entered, this has to be entered at [facility] point of entry) Patient coming from: Home  I have personally briefly reviewed patient's old medical records in [place]  Chief Complaint: SOB, leg swelling  HPI: Renee Paul is a 36 y.o. female with medical history significant of chronic HFpEF, HTN, asthma, sleep apnea, chronic hypoxic respiratory 1 to 2 L as needed, cigarette smoker morbid obesity, chronic iron deficiency anemia secondary to menorrhagia, presented for increasing shortness of breath and leg swelling.  Patient reported she has not been compliant with her Lasix recently due to frequent moving to and from her old home to a new house and "did not want to take Lasix for going to bathroom on the road".  Increasing, she started to feel exertional dyspnea and noticeable leg swelling.  Last week she developed a productive cough of colored phlegm, and worsening of exertional dyspnea.  Last night she had trouble lying flat to sleep because shortness of breath.  No chest pain no fever or chills.  ED Course: Patient was found hypoxic with saturation 78% on room air, stabilized on 4 L.  Catheter showed bilateral mild pulmonary edema.  Patient was given 60 mg IV Lasix and reported some improvement of breathing symptoms.  Review of Systems: As per HPI otherwise 14 point review of systems negative.   Past Medical History:  Diagnosis Date   Asthma    CHF (congestive heart failure) ([place])    Morbid obesity ([place])     Past Surgical History:  Procedure Laterality Date   CESAREAN SECTION     CESAREAN SECTION     CHOLECYSTECTOMY     INTRAUTERINE DEVICE INSERTION     IUD REMOVAL     TUBAL LIGATION       reports that she has been smoking cigarettes. She has a 9.00 pack-year smoking history. She has never  used smokeless tobacco. She reports current alcohol use of about 8.0 standard drinks of alcohol per week. She reports that she does not use drugs.  No Known Allergies  History reviewed. No pertinent family history.   Prior to Admission medications   Medication Sig Start Date End Date Taking? Authorizing Provider  albuterol (PROVENTIL HFA;VENTOLIN HFA) 108 (90 Base) MCG/ACT inhaler Inhale 2 puffs into the lungs every 4 (four) hours as needed for wheezing or shortness of breath. 09/06/16   Orpah Greek, MD  Ascorbic Acid (VITAMIN C) 1000 MG tablet Take 1,000 mg by mouth daily. 09/30/20   [provider]  ferrous sulfate 325 (65 FE) MG tablet Take 1 tablet (325 mg total) by mouth daily with breakfast. 06/10/21   Patrecia Pour, MD  furosemide (LASIX) 40 MG tablet Take 2 tablets (80 mg total) by mouth daily. 06/10/21   Patrecia Pour, MD  megestrol (MEGACE) 40 MG tablet Take 1 tablet (40 mg total) by mouth 2 (two) times daily. 06/10/21   Patrecia Pour, MD    Physical Exam: Vitals:   10/22/21 1338 10/22/21 1500 10/22/21 1622 10/22/21 1628  BP:  (!) 129/93  129/72  Pulse:  76  87  Resp:  16  18  Temp: 98.9 F (37.2 C)   98.3 F (36.8 C)  TempSrc: Oral   Oral  SpO2:  92%  92%  Weight:   (!) 213.1 kg   Height:  $'5\' 3"'W$  (1.6 m)     Constitutional: NAD, calm, comfortable Vitals:   10/22/21 1338 10/22/21 1500 10/22/21 1622 10/22/21 1628  BP:  (!) 129/93  129/72  Pulse:  76  87  Resp:  16  18  Temp: 98.9 F (37.2 C)   98.3 F (36.8 C)  TempSrc: Oral   Oral  SpO2:  92%  92%  Weight:   (!) 213.1 kg   Height:   '5\' 3"'$  (1.6 m)    Eyes: PERRL, lids and conjunctivae normal ENMT: Mucous membranes are moist. Posterior pharynx clear of any exudate or lesions.Normal dentition.  Neck: normal, supple, no masses, no thyromegaly Respiratory: clear to auscultation bilaterally, no wheezing, bilateral fine crackles to the mid level of the lung fields, increasing breathing effort. Normal  respiratory effort. No accessory muscle use.  Cardiovascular: Regular rate and rhythm, no murmurs / rubs / gallops.  2+ extremity edema. 2+ pedal pulses. No carotid bruits.  Abdomen: no tenderness, no masses palpated. No hepatosplenomegaly. Bowel sounds positive.  Musculoskeletal: no clubbing / cyanosis. No joint deformity upper and lower extremities. Good ROM, no contractures. Normal muscle tone.  Skin: no rashes, lesions, ulcers. No induration Neurologic: CN 2-12 grossly intact. Sensation intact, DTR normal. Strength 5/5 in all 4.  Psychiatric: Normal judgment and insight. Alert and oriented x 3. Normal mood.     Labs on Admission: I have personally reviewed following labs and imaging studies  CBC: Recent Labs  Lab 10/22/21 0927  WBC 7.0  NEUTROABS 5.1  HGB 12.9  HCT 43.6  MCV 92.8  PLT 202   Basic Metabolic Panel: Recent Labs  Lab 10/22/21 0927  NA 136  K 4.1  CL 100  CO2 29  GLUCOSE 167*  BUN 13  CREATININE 0.82  CALCIUM 8.2*   GFR: Estimated Creatinine Clearance: 171.4 mL/min (by C-G formula based on SCr of 0.82 mg/dL). Liver Function Tests: Recent Labs  Lab 10/22/21 0927  AST 25  ALT 18  ALKPHOS 50  BILITOT 1.1  PROT 7.9  ALBUMIN 3.1*   Recent Labs  Lab 10/22/21 0927  LIPASE 49   No results for input(s): "AMMONIA" in the last 168 hours. Coagulation Profile: No results for input(s): "INR", "PROTIME" in the last 168 hours. Cardiac Enzymes: No results for input(s): "CKTOTAL", "CKMB", "CKMBINDEX", "TROPONINI" in the last 168 hours. BNP (last 3 results) No results for input(s): "PROBNP" in the last 8760 hours. HbA1C: No results for input(s): "HGBA1C" in the last 72 hours. CBG: No results for input(s): "GLUCAP" in the last 168 hours. Lipid Profile: No results for input(s): "CHOL", "HDL", "LDLCALC", "TRIG", "CHOLHDL", "LDLDIRECT" in the last 72 hours. Thyroid Function Tests: No results for input(s): "TSH", "T4TOTAL", "FREET4", "T3FREE", "THYROIDAB"  in the last 72 hours. Anemia Panel: No results for input(s): "VITAMINB12", "FOLATE", "FERRITIN", "TIBC", "IRON", "RETICCTPCT" in the last 72 hours. Urine analysis:    Component Value Date/Time   COLORURINE YELLOW 06/05/2021 0416   APPEARANCEUR CLEAR 06/05/2021 0416   LABSPEC 1.014 06/05/2021 0416   PHURINE 7.0 06/05/2021 0416   GLUCOSEU NEGATIVE 06/05/2021 0416   HGBUR LARGE (A) 06/05/2021 0416   BILIRUBINUR NEGATIVE 06/05/2021 0416   KETONESUR NEGATIVE 06/05/2021 0416   PROTEINUR NEGATIVE 06/05/2021 0416   NITRITE NEGATIVE 06/05/2021 0416   LEUKOCYTESUR NEGATIVE 06/05/2021 0416    Radiological Exams on Admission: DG Chest Portable 1 View  Result Date: 10/22/2021 CLINICAL DATA:  Provided history: Shortness of breath. Additional history provided: Cough, chest tightness, history of asthma, history  of CHF. EXAM: PORTABLE CHEST 1 VIEW COMPARISON:  Prior chest radiographs October 14, 2021 and earlier. FINDINGS: Cardiomegaly with central pulmonary vascular congestion. Mild prominence of the interstitial lung markings within the mid-to-lower lung fields. No evidence of pleural effusion or pneumothorax. No acute bony abnormality identified. IMPRESSION: Cardiomegaly with central pulmonary vascular congestion. Mild prominence of the interstitial lung markings within the mid-to-lower lung fields. This may reflect pulmonary interstitial edema. However, atypical/viral pneumonia can of a similar imaging appearance and clinical correlation is recommended. Electronically Signed   By: Kellie Simmering D.O.   On: 10/22/2021 09:48    EKG: Independently reviewed.  Sinus rhythm, no acute ST changes.  Assessment/Plan Principal Problem:   Acute on chronic diastolic (congestive) heart failure (HCC) Active Problems:   CHF (congestive heart failure) ([place])  (please populate well all problems here in Problem List. (For example, if patient is on BP meds at home and you resume or decide to hold them, it is a problem that  needs to be her. Same for CAD, COPD, HLD and so on)  Acute on chronic HFpEF -Secondary to noncompliant with diuresis -Continue Lasix IV 60 mg twice daily -Daily BMP -Echocardiogram was done about 3 months ago, will not repeat at this time.  Acute on chronic hypoxic respiratory failure -Secondary to acute CHF decompensation, treatment as above.  Cigarette smoke -Cessation consultation performed -Nicotine patch  Acute asthma exacerbation -DuoNebs, as needed Atrovent -Add Dulera -Incentive spirometry  Chronic iron deficiency anemia -H&H stable, continue iron supplement  DVT prophylaxis: Lovenox Code Status: Full code Family Communication: None at bedside Disposition Plan: Patient sick with significant fluid overload, requiring IV diuresis expect more than 2 midnight hospital stay Consults called: None Admission status: Telemetry admission   Lequita Halt MD [facility] Pager [phone]  10/22/2021, 6:07 PM

## 2021-10-23 ENCOUNTER — Inpatient Hospital Stay (HOSPITAL_COMMUNITY): Payer: No Typology Code available for payment source

## 2021-10-23 DIAGNOSIS — I5033 Acute on chronic diastolic (congestive) heart failure: Secondary | ICD-10-CM | POA: Diagnosis not present

## 2021-10-23 DIAGNOSIS — J45901 Unspecified asthma with (acute) exacerbation: Secondary | ICD-10-CM | POA: Diagnosis present

## 2021-10-23 DIAGNOSIS — D509 Iron deficiency anemia, unspecified: Secondary | ICD-10-CM

## 2021-10-23 DIAGNOSIS — J45909 Unspecified asthma, uncomplicated: Secondary | ICD-10-CM | POA: Diagnosis not present

## 2021-10-23 LAB — BASIC METABOLIC PANEL
Anion gap: 8 (ref 5–15)
BUN: 11 mg/dL (ref 6–20)
CO2: 31 mmol/L (ref 22–32)
Calcium: 8.5 mg/dL — ABNORMAL LOW (ref 8.9–10.3)
Chloride: 99 mmol/L (ref 98–111)
Creatinine, Ser: 0.82 mg/dL (ref 0.44–1.00)
GFR, Estimated: >60 mL/min (ref >60)
Glucose, Bld: 110 mg/dL — ABNORMAL HIGH (ref 70–99)
Potassium: 3.8 mmol/L (ref 3.5–5.1)
Sodium: 138 mmol/L (ref 135–145)

## 2021-10-23 MED ORDER — EMPAGLIFLOZIN 10 MG PO TABS
10.0000 mg | ORAL_TABLET | Freq: Every day | ORAL | Status: DC
  Filled 2021-10-23 (×4): qty 1

## 2021-10-23 MED ORDER — OXYCODONE HCL 5 MG PO TABS
5.0000 mg | ORAL_TABLET | ORAL | Status: AC | PRN
  Administered 2021-10-24 (×2): 5 mg via ORAL
  Filled 2021-10-23 (×2): qty 1

## 2021-10-23 MED ORDER — IPRATROPIUM-ALBUTEROL 0.5-2.5 (3) MG/3ML IN SOLN: 3.0000 mL | RESPIRATORY_TRACT | Status: DC | PRN

## 2021-10-23 MED ORDER — IPRATROPIUM-ALBUTEROL 0.5-2.5 (3) MG/3ML IN SOLN
3.0000 mL | Freq: Three times a day (TID) | RESPIRATORY_TRACT | Status: DC
Start: 2021-10-23 — End: 2021-10-24
  Administered 2021-10-23 – 2021-10-24 (×2): 3 mL via RESPIRATORY_TRACT
  Filled 2021-10-23 (×2): qty 3

## 2021-10-23 NOTE — Assessment & Plan Note (Signed)
Patient had uterine bleeding in the past with no evidence of current bleeding or blood loss. Her Hgb is stable at 12,9

## 2021-10-23 NOTE — Assessment & Plan Note (Signed)
05/2021 Echocardiogram with preserved LV systolic function 60 to 66% with preserved RV systolic function, moderate left atrial dilatation.   Urine output is 3,200 cc over last 24 hrs Blood pressure systolic 060 to 045 mmHg.  Improved volume status after diuresis. [place] right lower extremity from this month was negative for DVT (care everywhere)  Continue to hold on RAS inhibition due to risk of hypotension.

## 2021-10-23 NOTE — Hospital Course (Signed)
Renee Paul was admitted to the hospital with the working diagnosis of heart failure decompensation.   36 yo female with the past medical history of heart failure, asthma, sleep apnea, chronic hypoxemic respiratory failure and obesity class 3 who presented with dyspnea and lower extremity edema. Patient has not been compliant with diuretic therapy as outpatient. Reported worsening lower extremity edema and dyspnea, and recently orthopnea and PND. On her initial physical examination her 02 saturation was 74%, with blood pressure 129/93, HR 76, RR 16 lungs with bilateral rales and increase work of breathing, heart with S1 and S2 present and rhythmic, abdomen not distended and positive lower extremity edema.   Na 136 K 4,1 Cl 100 bicarbonate 29 glucose 167 bun 13 cr 0,82 BNP 135  High sensitive troponin 13 and 11  Wbc 7,0 hgb 12,9 pl 275  Chest radiograph with cardiomegaly, bilateral hilar vascular congestion, cephalization of the vasculature and bilateral interstitial infiltrates.   EKG 97 bpm, right axis deviation, right atrial enlargement, with normal intervals, sinus rhythm, with no significant ST segment or T wave changes.   Patient was placed on IV furosemide for diuresis.  Supplemental 02 per [place]  Volume status has improved, but patient continue to have respiratory symptoms. Positive sick contacts at home, plan to check viral panel including covid 19.

## 2021-10-23 NOTE — Progress Notes (Signed)
Pt refused Jardiance at this time d/t to many side effects

## 2021-10-23 NOTE — Plan of Care (Signed)
  Problem: Clinical Measurements: Goal: Respiratory complications will improve Outcome: Progressing   Problem: Coping: Goal: Level of anxiety will decrease Outcome: Progressing   Problem: Elimination: Goal: Will not experience complications related to urinary retention Outcome: Progressing   

## 2021-10-23 NOTE — Plan of Care (Signed)
  Problem: Clinical Measurements: Goal: Respiratory complications will improve Outcome: Progressing   Problem: Coping: Goal: Level of anxiety will decrease Outcome: Progressing   Problem: Pain Managment: Goal: General experience of comfort will improve Outcome: Progressing   

## 2021-10-23 NOTE — Assessment & Plan Note (Signed)
Stable with no signs of acute exacerbation, patient's wheezing likely more related to acute pulmonary edema.

## 2021-10-23 NOTE — Progress Notes (Signed)
Pt is asleep and arousal; 02 4L 89% increased to 5L 90 and 91%.

## 2021-10-23 NOTE — Assessment & Plan Note (Signed)
Calculated BMI is 83,0

## 2021-10-23 NOTE — Progress Notes (Signed)
  Progress Note   Patient: Renee Paul QJF:[id number] DOB: [birth year] DOA: 10/22/2021     1 DOS: the patient was seen and examined on 10/23/2021   Brief hospital course: Renee Paul was admitted to the hospital with the working diagnosis of heart failure decompensation.   36 yo female with the past medical history of heart failure, asthma, sleep apnea, chronic hypoxemic respiratory failure and obesity class 3 who presented with dyspnea and lower extremity edema. Patient has not been compliant with diuretic therapy as outpatient. Reported worsening lower extremity edema and dyspnea, and recently orthopnea and PND. On her initial physical examination her 02 saturation was 74%, with blood pressure 129/93, HR 76, RR 16 lungs with bilateral rales and increase work of breathing, heart with S1 and S2 present and rhythmic, abdomen not distended and positive lower extremity edema.   Na 136 K 4,1 Cl 100 bicarbonate 29 glucose 167 bun 13 cr 0,82 BNP 135  High sensitive troponin 13 and 11  Wbc 7,0 hgb 12,9 pl 275  Chest radiograph with cardiomegaly, bilateral hilar vascular congestion, cephalization of the vasculature and bilateral interstitial infiltrates.   EKG 97 bpm, right axis deviation, right atrial enlargement, with normal intervals, sinus rhythm, with no significant ST segment or T wave changes.   Patient was placed on IV furosemide for diuresis.  Supplemental 02 per [place]  Assessment and Plan: * Acute on chronic diastolic (congestive) heart failure ([place]) 05/2021 Echocardiogram with preserved LV systolic function 60 to 89% with preserved RV systolic function, moderate left atrial dilatation.   Urine output is 2,400 cc over last 24 hrs Blood pressure systolic 373 to 428 mmHg.  Plan to continue diuresis with IV furosemide, continue with 60 mg IV q12 hrs Add empagliflozin  Continue blood pressure monitoring, plan to add RAS inhibition during this hospitalization.   Chronic iron deficiency  anemia Patient had uterine bleeding in the past with no evidence of current bleeding or blood loss. Her Hgb is stable at 12,9  Asthma, chronic Stable with no signs of acute exacerbation, patient's wheezing likely more related to acute pulmonary edema.   Class 3 obesity (HCC) Calculated BMI is 83,0        Subjective: Patient somnolent at the time of my evaluation, she is easy to arouse and able to respond to questions.   Physical Exam: Vitals:   10/23/21 0719 10/23/21 0838 10/23/21 1416 10/23/21 1430  BP:  114/74  128/76  Pulse:  84    Resp:  19    Temp:  (!) 97.5 F (36.4 C)  98.2 F (36.8 C)  TempSrc:  Oral  Oral  SpO2: 91% 90% 92% (!) 89%  Weight:      Height:       Neurology somnolent but easy to arouse, follow commands and responds to questions. Non focal ENT with mild pallor Cardiovascular with S1 and S2 present and rhythmic with no gallops, rubs or murmurs Respiratory with positive expiratory wheezing bilaterally Abdomen protuberant Positive non pitting lower extremity edema +++  Data Reviewed:    Family Communication: no family at the bedside   Disposition: Status is: Inpatient Remains inpatient appropriate because: heart failure   Planned Discharge Destination: Home      Author: Tawni Millers, MD 10/23/2021 3:31 PM  For on call review [email or web address].

## 2021-10-24 ENCOUNTER — Other Ambulatory Visit (HOSPITAL_COMMUNITY): Payer: Self-pay

## 2021-10-24 DIAGNOSIS — J45901 Unspecified asthma with (acute) exacerbation: Secondary | ICD-10-CM | POA: Diagnosis not present

## 2021-10-24 DIAGNOSIS — D509 Iron deficiency anemia, unspecified: Secondary | ICD-10-CM | POA: Diagnosis not present

## 2021-10-24 DIAGNOSIS — I5033 Acute on chronic diastolic (congestive) heart failure: Secondary | ICD-10-CM | POA: Diagnosis not present

## 2021-10-24 LAB — RESPIRATORY PANEL BY PCR

## 2021-10-24 LAB — BASIC METABOLIC PANEL
Anion gap: 7 (ref 5–15)
BUN: 11 mg/dL (ref 6–20)
CO2: 32 mmol/L (ref 22–32)
Calcium: 8.3 mg/dL — ABNORMAL LOW (ref 8.9–10.3)
Chloride: 98 mmol/L (ref 98–111)
Creatinine, Ser: 0.74 mg/dL (ref 0.44–1.00)
GFR, Estimated: >60 mL/min (ref >60)
Glucose, Bld: 141 mg/dL — ABNORMAL HIGH (ref 70–99)
Potassium: 3.7 mmol/L (ref 3.5–5.1)
Sodium: 137 mmol/L (ref 135–145)

## 2021-10-24 LAB — SARS CORONAVIRUS 2 BY RT PCR: SARS Coronavirus 2 by RT PCR: NEGATIVE

## 2021-10-24 MED ORDER — ALBUTEROL SULFATE (2.5 MG/3ML) 0.083% IN NEBU
3.0000 mL | INHALATION_SOLUTION | Freq: Three times a day (TID) | RESPIRATORY_TRACT | Status: DC
Start: 2021-10-24 — End: 2021-10-24

## 2021-10-24 MED ORDER — BUDESONIDE 0.5 MG/2ML IN SUSP
0.5000 mg | Freq: Two times a day (BID) | RESPIRATORY_TRACT | Status: DC
  Administered 2021-10-24 – 2021-10-27 (×6): 0.5 mg via RESPIRATORY_TRACT
  Filled 2021-10-24 (×8): qty 2

## 2021-10-24 MED ORDER — IPRATROPIUM-ALBUTEROL 0.5-2.5 (3) MG/3ML IN SOLN: 3.0000 mL | RESPIRATORY_TRACT | Status: DC | PRN

## 2021-10-24 MED ORDER — FUROSEMIDE 40 MG PO TABS: 40.0000 mg | ORAL_TABLET | Freq: Every day | ORAL | Status: DC

## 2021-10-24 MED ORDER — IPRATROPIUM-ALBUTEROL 0.5-2.5 (3) MG/3ML IN SOLN: 3.0000 mL | Freq: Four times a day (QID) | RESPIRATORY_TRACT | Status: DC

## 2021-10-24 MED ORDER — ALBUTEROL SULFATE HFA 108 (90 BASE) MCG/ACT IN AERS
2.0000 | INHALATION_SPRAY | Freq: Three times a day (TID) | RESPIRATORY_TRACT | Status: DC
Start: 2021-10-24 — End: 2021-10-25
  Administered 2021-10-25: 2 via RESPIRATORY_TRACT
  Filled 2021-10-24: qty 6.7

## 2021-10-24 MED ORDER — OXYCODONE HCL 5 MG PO TABS
5.0000 mg | ORAL_TABLET | ORAL | Status: DC | PRN
  Administered 2021-10-24 – 2021-10-26 (×5): 5 mg via ORAL
  Filled 2021-10-24 (×5): qty 1

## 2021-10-24 NOTE — Progress Notes (Signed)
Heart Failure Stewardship Pharmacist Progress Note   PCP: Medicine, [place] Family PCP-Cardiologist: None    HPI:  36 yo F with PMH of HFpEF, morbid obesity, untreated OSA, asthma, and uterine bleeding.  Admitted in 05/2021 with acute on chronic CHF and abnormal uterine bleeding. She was diuresed and discharged with lasix 80 mg daily. Difficult socioeconomic situation.   She was admitted to [facility] on 10/09/21 with acute on chronic hypoxic respiratory failure with hypercarbia. She reported noncompliance with home oxygen due to temporary homelessness. She was discharged with lasix 80 mg daily and metolazone 2.5 mg every other day PRN.  She presented back to the ED on 10/14/21 with shortness of breath and LEE. Had not been taking medications because she reported she was unable to afford them. Copay for her meds were around $3 each, and the family stated if they had known they were this amount, they would have paid for them. Discharged from the ED and encouraged to pick up medications.  She presented again to the ED on 10/22/21 with cough, shortness of breath, orthopnea, and chest discomfort. She states she hadn't taken her diuretic in 1 week. She had lost the medications given to her at last discharge. CXR with cardiomegaly and pulmonary vascular congestion. She was admitted with CHF exacerbation in the setting of medication noncompliance.   Current HF Medications: Diuretic: furosemide 60 mg IV BID SGLT2i: Jardiance 10 mg daily  Prior to admission HF Medications: Diuretic: furosemide 80 mg daily + metolazone 2.5 mg daily PRN  Pertinent Lab Values: Serum creatinine 0.74, BUN 11, Potassium 3.7, Sodium 137, BNP 135.7  Vital Signs: Weight: 468 lbs (admission weight: 469 lbs) Blood pressure: 130/80s  Heart rate: 60s  I/O: -2.1L yesterday; net -4.2L  Medication Assistance / Insurance Benefits Check: Does the patient have prescription insurance?  Yes Type of insurance plan:  [place]:  [facility] Is the patient willing to use [place] at discharge? Yes Is the patient willing to transition their outpatient pharmacy to utilize a [facility] outpatient pharmacy?   Pending    Assessment: 1. Acute on chronic diastolic CHF (LVEF 01-60%), due to presumed NICM. NYHA class III symptoms. - Continue furosemide 60 mg IV BID. Strict I/O. Daily weights. Keep K>4 and Mag>2. Check magnesium with AM labs. - Consider adding Entresto 24/26 mg BID for HFpEF optimization  - Continue Jardiance 10 mg daily  Plan: 1) Medication changes recommended at this time: - Add Entresto 24/26 mg BID - Check magnesium level tomorrow AM  2) Patient assistance: - Insurance not contracted with [facility] - unable to run copay checks - Upon further investigation, copays are around $3  3)  Education  - To be completed prior to discharge  Kerby Nora, PharmD, BCPS Heart Failure Cytogeneticist Phone [phone]

## 2021-10-24 NOTE — Progress Notes (Signed)
Patient is experiencing ongoing sharp chest pain rating at 8 on scale 0-10. when coughing and repositioning. MD aware.

## 2021-10-24 NOTE — Progress Notes (Signed)
Progress Note   Patient: Renee Paul OXB:[id number] DOB: 04-Mar-1986 DOA: 10/22/2021     2 DOS: the patient was seen and examined on 10/24/2021   Brief hospital course: Renee Paul was admitted to the hospital with the working diagnosis of heart failure decompensation.   36 yo female with the past medical history of heart failure, asthma, sleep apnea, chronic hypoxemic respiratory failure and obesity class 3 who presented with dyspnea and lower extremity edema. Patient has not been compliant with diuretic therapy as outpatient. Reported worsening lower extremity edema and dyspnea, and recently orthopnea and PND. On her initial physical examination her 02 saturation was 74%, with blood pressure 129/93, HR 76, RR 16 lungs with bilateral rales and increase work of breathing, heart with S1 and S2 present and rhythmic, abdomen not distended and positive lower extremity edema.   Na 136 K 4,1 Cl 100 bicarbonate 29 glucose 167 bun 13 cr 0,82 BNP 135  High sensitive troponin 13 and 11  Wbc 7,0 hgb 12,9 pl 275  Chest radiograph with cardiomegaly, bilateral hilar vascular congestion, cephalization of the vasculature and bilateral interstitial infiltrates.   EKG 97 bpm, right axis deviation, right atrial enlargement, with normal intervals, sinus rhythm, with no significant ST segment or T wave changes.   Patient was placed on IV furosemide for diuresis.  Supplemental 02 per [place]  Volume status has improved, but patient continue to have respiratory symptoms. Positive sick contacts at home, plan to check viral panel including covid 19.   Assessment and Plan: * Acute on chronic diastolic (congestive) heart failure ([place]) 05/2021 Echocardiogram with preserved LV systolic function 60 to 68% with preserved RV systolic function, moderate left atrial dilatation.   Urine output is 3,200 cc over last 24 hrs Blood pressure systolic 341 to 962 mmHg.  Improved volume status after diuresis. [place] right lower  extremity from this month was negative for DVT (care everywhere)  Continue to hold on RAS inhibition due to risk of hypotension.   Chronic iron deficiency anemia Patient had uterine bleeding in the past with no evidence of current bleeding or blood loss. Her Hgb is stable at 12,9  Acute asthma exacerbation Patient not wheezing but persistent dyspnea, positive sick contacts at home.  Plan to continue aggressive bronchodilator therapy with duoneb and will increase dose of inhaled corticosteroid. Check viral panel including covid 19. Hold on systemic steroids for now. Out of bed to chair tid with meals, PT and Ot.  Continue oxymetry monitoring, patient is on home 02 2 L/min per [place] specially on ambulation.   Class 3 obesity (HCC) Calculated BMI is 83,0        Subjective: Patient continue to have dyspnea and chest pressure, edema has resolved,   Physical Exam: Vitals:   10/24/21 0506 10/24/21 0710 10/24/21 0724 10/24/21 1137  BP: (!) 133/107  124/81 (!) 133/98  Pulse: 75  67 68  Resp: '18  18 20  '$ Temp: 98.2 F (36.8 C)  98.1 F (36.7 C) 98.3 F (36.8 C)  TempSrc: Oral  Oral Oral  SpO2: 93% 96% 96% 90%  Weight:      Height:       Neurology awake and alert ENT with mild pallor Cardiovascular with S1 and S2 present  Respiratory with no rales or wheezing on anterior auscultation  Abdomen not distended Lower extremities with non pitting edema   Data Reviewed:    Family Communication: no family at the bedside   Disposition: Status is: Inpatient Remains inpatient appropriate  because: heart failure, asthma exacerbation, possible dc home in am.   Planned Discharge Destination: Home     Author: Tawni Millers, MD 10/24/2021 1:48 PM  For on call review [email or web address].

## 2021-10-24 NOTE — Progress Notes (Signed)
PT Cancellation Note  Patient Details Name: Renee Paul MRN: [id number] DOB: 23-Jun-1985   Cancelled Treatment:    Reason Eval/Treat Not Completed: Patient declined, no reason specified Patient complaining of chest discomfort and declined mobility despite encouragement. PT will re-attempt as time allows.   Marylouise Mallet A. Gilford Rile PT, DPT Acute Rehabilitation Services Office [phone]    Linna Hoff 10/24/2021, 4:42 PM

## 2021-10-25 DIAGNOSIS — I5033 Acute on chronic diastolic (congestive) heart failure: Secondary | ICD-10-CM | POA: Diagnosis not present

## 2021-10-25 LAB — BASIC METABOLIC PANEL
Anion gap: 10 (ref 5–15)
BUN: 12 mg/dL (ref 6–20)
CO2: 32 mmol/L (ref 22–32)
Calcium: 8.7 mg/dL — ABNORMAL LOW (ref 8.9–10.3)
Chloride: 97 mmol/L — ABNORMAL LOW (ref 98–111)
Creatinine, Ser: 0.7 mg/dL (ref 0.44–1.00)
GFR, Estimated: >60 mL/min (ref >60)
Glucose, Bld: 87 mg/dL (ref 70–99)
Potassium: 4.1 mmol/L (ref 3.5–5.1)
Sodium: 139 mmol/L (ref 135–145)

## 2021-10-25 LAB — MAGNESIUM: Magnesium: 2 mg/dL (ref 1.7–2.4)

## 2021-10-25 MED ORDER — IPRATROPIUM-ALBUTEROL 0.5-2.5 (3) MG/3ML IN SOLN
3.0000 mL | Freq: Four times a day (QID) | RESPIRATORY_TRACT | Status: DC
Start: 2021-10-25 — End: 2021-10-26
  Administered 2021-10-25 – 2021-10-26 (×4): 3 mL via RESPIRATORY_TRACT
  Filled 2021-10-25 (×6): qty 3

## 2021-10-25 MED ORDER — FUROSEMIDE 10 MG/ML IJ SOLN
40.0000 mg | Freq: Two times a day (BID) | INTRAMUSCULAR | Status: DC
  Administered 2021-10-25 (×2): 40 mg via INTRAVENOUS
  Filled 2021-10-25 (×2): qty 4

## 2021-10-25 MED ORDER — SPIRONOLACTONE 25 MG PO TABS
25.0000 mg | ORAL_TABLET | Freq: Every day | ORAL | Status: DC
  Administered 2021-10-27: 25 mg via ORAL
  Filled 2021-10-25 (×3): qty 1

## 2021-10-25 MED ORDER — METHYLPREDNISOLONE SODIUM SUCC 125 MG IJ SOLR
80.0000 mg | INTRAMUSCULAR | Status: DC
  Administered 2021-10-25: 80 mg via INTRAVENOUS
  Filled 2021-10-25: qty 2
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
## 2021-10-25 NOTE — TOC Progression Note (Signed)
Transition of Care [facility]) - Progression Note    Patient Details  Name: Carolynne Schuchard MRN: [id number] Date of Birth: July 20, 1985  Transition of Care [facility]) CM/SW Contact  Zenon Mayo, RN Phone Number: 10/25/2021, 11:54 AM  Clinical Narrative:    from home, Asthma, COPD, volume overload, conts on IV steroids, and IV lasix. has transportation home. TOC following.   Expected Discharge Plan: Home/Self Care Barriers to Discharge: Continued Medical Work up  Expected Discharge Plan and Services Expected Discharge Plan: Home/Self Care   Discharge Planning Services: CM Consult                                           Social Determinants of Health (SDOH) Interventions    Readmission Risk Interventions     No data to display

## 2021-10-25 NOTE — Progress Notes (Signed)
Heart Failure Nurse Navigator Progress Note  Appointment scheduled for HF TOC clinic on 8/15 @ 2pm.  Provided patient with appt information including address and phone number. Informed her that she would receive a phone call reminder before the appt. Information will also be listed on her discharge summary.  Patient states she has a working cell phone to receive the reminder calls.  She states she does not have a bariatric scale or pill box. Will relay this information to the clinic staff.  Kerby Nora, PharmD, BCPS Heart Failure Stewardship Pharmacist Phone [phone]

## 2021-10-25 NOTE — Progress Notes (Signed)
Heart Failure Stewardship Pharmacist Progress Note   PCP: Medicine, [place] Family PCP-Cardiologist: None    HPI:  36 yo F with PMH of HFpEF, morbid obesity, untreated OSA, asthma, and uterine bleeding.  Admitted in 05/2021 with acute on chronic CHF and abnormal uterine bleeding. She was diuresed and discharged with lasix 80 mg daily. Difficult socioeconomic situation.   She was admitted to [facility] on 10/09/21 with acute on chronic hypoxic respiratory failure with hypercarbia. She reported noncompliance with home oxygen due to temporary homelessness. She was discharged with lasix 80 mg daily and metolazone 2.5 mg every other day PRN.  She presented back to the ED on 10/14/21 with shortness of breath and LEE. Had not been taking medications because she reported she was unable to afford them. Copay for her meds were around $3 each, and the family stated if they had known they were this amount, they would have paid for them. Discharged from the ED and encouraged to pick up medications.  She presented again to the ED on 10/22/21 with cough, shortness of breath, orthopnea, and chest discomfort. She states she hadn't taken her diuretic in 1 week. She had lost the medications given to her at last discharge. CXR with cardiomegaly and pulmonary vascular congestion. She was admitted with CHF exacerbation in the setting of medication noncompliance.   Current HF Medications: Diuretic: furosemide 40 mg IV BID SGLT2i: Jardiance 10 mg daily  Prior to admission HF Medications: Diuretic: furosemide 80 mg daily + metolazone 2.5 mg daily PRN  Pertinent Lab Values: Serum creatinine 0.70, BUN 12, Potassium 4.1, Sodium 139, BNP 135.7, Magnesium 2.0  Vital Signs: Weight: 464 lbs (admission weight: 469 lbs) Blood pressure: 120/90s  Heart rate: 60-80s  I/O: -3.1L yesterday; net -5.3L  Medication Assistance / Insurance Benefits Check: Does the patient have prescription insurance?  Yes Type of  insurance plan: [place]:  [facility] Is the patient willing to use [place] at discharge? Yes Is the patient willing to transition their outpatient pharmacy to utilize a [facility] outpatient pharmacy?   Pending    Assessment: 1. Acute on chronic diastolic CHF (LVEF 29-92%), due to presumed NICM. NYHA class III symptoms. - Continue furosemide 40 mg IV BID. Strict I/O. Daily weights. Keep K>4 and Mag>2.  - Consider adding Entresto 24/26 mg BID for HFpEF optimization  - Agree with adding MRA today. No order placed yet. Will reach out to MD. - Continue Jardiance 10 mg daily  Plan: 1) Medication changes recommended at this time: - Add spironolactone 25 mg daily  2) Patient assistance: - Insurance not contracted with [facility] - unable to run copay checks - Upon further investigation, copays are around $3  3)  Education  - Patient has been educated on current HF medications and potential additions to HF medication regimen - Patient verbalizes understanding that over the next few months, these medication doses may change and more medications may be added to optimize HF regimen - Patient has been educated on basic disease state pathophysiology and goals of therapy   Kerby Nora, PharmD, BCPS Heart Failure Cytogeneticist Phone [phone]

## 2021-10-25 NOTE — Plan of Care (Signed)
  Problem: Clinical Measurements: Goal: Diagnostic test results will improve Outcome: Progressing Goal: Respiratory complications will improve Outcome: Progressing   

## 2021-10-25 NOTE — Progress Notes (Signed)
PROGRESS NOTE    Renee Paul  QMG:[id number] DOB: February 08, 1986 DOA: 10/22/2021 PCP: [facility], [facility]  36/F with history of chronic diastolic CHF, asthma, tobacco use, morbid obesity with BMI of 82, suspected OSA/OHS presented to the ED with dyspnea and cough congestion wheezing and lower extremity edema. -Chest x-ray noted pulmonary vascular congestion and bilateral interstitial infiltrates -Also noted to be positive for rhinovirus with asthma exacerbation   Subjective: -Still having chest tightness and some wheezing, cough -Swelling has improved  Assessment and Plan:   Acute on chronic diastolic (congestive) heart failure ([place]) -Echo 3/23 with preserved EF of 60-65%, preserved RV function, moderate left atrial dilation -Noncompliant with diuretics at baseline, situation complicated by recent homelessness as well -Diuresing well on IV Lasix she is 5.2 L negative -Continue IV Lasix 1 more day, add Aldactone, continue Jardiance  Asthma exacerbation, + rhinovirus -Could have asthma/COPD overlap, long-term smoker -Persistent dyspnea cough and wheezing will add IV steroids today, scheduled DuoNebs  Chronic iron deficiency anemia Patient had uterine bleeding in the past with no evidence of current bleeding or blood loss. Her Hgb is stable at 12,9  Morbid obesity -BMI 82.2, recommended importance of diet and lifestyle modification -Would benefit from referral to obesity clinic  Tobacco abuse -Counseled, continue nicotine patch  DVT prophylaxis: Lovenox Code Status: Full code Family Communication: Discussed patient detail no family at bedside Disposition Plan: Home in 1 to 2 days  Consultants:    Procedures:   Antimicrobials:    Objective: Vitals:   10/24/21 2020 10/25/21 0618 10/25/21 0759 10/25/21 0801  BP:  (!) 119/90    Pulse:  77    Resp:  18    Temp:  98.3 F (36.8 C)    TempSrc:  Oral    SpO2: 93% 95% 96% 94%  Weight:  (!) 210.6  kg    Height:        Intake/Output Summary (Last 24 hours) at 10/25/2021 1016 Last data filed at 10/25/2021 0400 Gross per 24 hour  Intake 480 ml  Output 1575 ml  Net -1095 ml   Filed Weights   10/22/21 1622 10/23/21 0521 10/25/21 0618  Weight: (!) 213.1 kg (!) 212.6 kg (!) 210.6 kg    Examination:  General exam: Morbidly obese chronically ill female sitting up in bed, AAOx3 Respiratory system: Poor air movement bilaterally, scattered expiratory wheezes Cardiovascular system: S1 & S2 heard, RRR.  Abd: nondistended, soft and nontender.Normal bowel sounds heard. Central nervous system: Alert and oriented. No focal neurological deficits. Extremities: Trace edema Skin: No rashes Psychiatry:  Mood & affect appropriate.     Data Reviewed:   CBC: Recent Labs  Lab 10/22/21 0927  WBC 7.0  NEUTROABS 5.1  HGB 12.9  HCT 43.6  MCV 92.8  PLT 326   Basic Metabolic Panel: Recent Labs  Lab 10/22/21 0927 10/23/21 0309 10/24/21 0259 10/25/21 0335  NA 136 138 137 139  K 4.1 3.8 3.7 4.1  CL 100 99 98 97*  CO2 29 31 32 32  GLUCOSE 167* 110* 141* 87  BUN '13 11 11 12  '$ CREATININE 0.82 0.82 0.74 0.70  CALCIUM 8.2* 8.5* 8.3* 8.7*  MG  --   --   --  2.0   GFR: Estimated Creatinine Clearance: 174.2 mL/min (by C-G formula based on SCr of 0.7 mg/dL). Liver Function Tests: Recent Labs  Lab 10/22/21 0927  AST 25  ALT 18  ALKPHOS 50  BILITOT 1.1  PROT 7.9  ALBUMIN 3.1*  Recent Labs  Lab 10/22/21 0927  LIPASE 49   No results for input(s): "AMMONIA" in the last 168 hours. Coagulation Profile: No results for input(s): "INR", "PROTIME" in the last 168 hours. Cardiac Enzymes: No results for input(s): "CKTOTAL", "CKMB", "CKMBINDEX", "TROPONINI" in the last 168 hours. BNP (last 3 results) No results for input(s): "PROBNP" in the last 8760 hours. HbA1C: No results for input(s): "HGBA1C" in the last 72 hours. CBG: No results for input(s): "GLUCAP" in the last 168  hours. Lipid Profile: No results for input(s): "CHOL", "HDL", "LDLCALC", "TRIG", "CHOLHDL", "LDLDIRECT" in the last 72 hours. Thyroid Function Tests: No results for input(s): "TSH", "T4TOTAL", "FREET4", "T3FREE", "THYROIDAB" in the last 72 hours. Anemia Panel: No results for input(s): "VITAMINB12", "FOLATE", "FERRITIN", "TIBC", "IRON", "RETICCTPCT" in the last 72 hours. Urine analysis:    Component Value Date/Time   COLORURINE YELLOW 06/05/2021 0416   APPEARANCEUR CLEAR 06/05/2021 0416   LABSPEC 1.014 06/05/2021 0416   PHURINE 7.0 06/05/2021 0416   GLUCOSEU NEGATIVE 06/05/2021 0416   HGBUR LARGE (A) 06/05/2021 0416   BILIRUBINUR NEGATIVE 06/05/2021 0416   KETONESUR NEGATIVE 06/05/2021 0416   PROTEINUR NEGATIVE 06/05/2021 0416   NITRITE NEGATIVE 06/05/2021 0416   LEUKOCYTESUR NEGATIVE 06/05/2021 0416   Sepsis Labs: '@LABRCNTIP'$ (procalcitonin:4,lacticidven:4)  ) Recent Results (from the past 240 hour(s))  Respiratory (~20 pathogens) panel by PCR     Status: Abnormal   Collection Time: 10/24/21  8:40 PM   Specimen: Nasopharyngeal Swab; Respiratory  Result Value Ref Range Status   Adenovirus NOT DETECTED NOT DETECTED Final   Coronavirus 229E NOT DETECTED NOT DETECTED Final    Comment: (NOTE) The Coronavirus on the Respiratory Panel, DOES NOT test for the novel  Coronavirus (2019 nCoV)    Coronavirus HKU1 NOT DETECTED NOT DETECTED Final   Coronavirus NL63 NOT DETECTED NOT DETECTED Final   Coronavirus OC43 NOT DETECTED NOT DETECTED Final   Metapneumovirus NOT DETECTED NOT DETECTED Final   Rhinovirus / Enterovirus DETECTED (A) NOT DETECTED Final   Influenza A NOT DETECTED NOT DETECTED Final   Influenza B NOT DETECTED NOT DETECTED Final   Parainfluenza Virus 1 NOT DETECTED NOT DETECTED Final   Parainfluenza Virus 2 NOT DETECTED NOT DETECTED Final   Parainfluenza Virus 3 NOT DETECTED NOT DETECTED Final   Parainfluenza Virus 4 NOT DETECTED NOT DETECTED Final   Respiratory  Syncytial Virus NOT DETECTED NOT DETECTED Final   Bordetella pertussis NOT DETECTED NOT DETECTED Final   Bordetella Parapertussis NOT DETECTED NOT DETECTED Final   Chlamydophila pneumoniae NOT DETECTED NOT DETECTED Final   Mycoplasma pneumoniae NOT DETECTED NOT DETECTED Final    Comment: Performed at [facility], [place] [address], [place], [place] [address]  SARS Coronavirus 2 by RT PCR (hospital order, performed in [facility] hospital lab) *cepheid single result test* Anterior Nasal Swab     Status: None   Collection Time: 10/24/21  8:40 PM   Specimen: Anterior Nasal Swab  Result Value Ref Range Status   SARS Coronavirus 2 by RT PCR NEGATIVE NEGATIVE Final    Comment: (NOTE) SARS-CoV-2 target nucleic acids are NOT DETECTED.  The SARS-CoV-2 RNA is generally detectable in upper and lower respiratory specimens during the acute phase of infection. The lowest concentration of SARS-CoV-2 viral copies this assay can detect is 250 copies / mL. A negative result does not preclude SARS-CoV-2 infection and should not be used as the sole basis for treatment or other patient management decisions.  A negative result may  occur with improper specimen collection / handling, submission of specimen other than nasopharyngeal swab, presence of viral mutation(s) within the areas targeted by this assay, and inadequate number of viral copies (<250 copies / mL). A negative result must be combined with clinical observations, patient history, and epidemiological information.  Fact Sheet for Patients:   https://www.patel.info/  Fact Sheet for Healthcare Providers: https://hall.com/  This test is not yet approved or  cleared by the [place] FDA and has been authorized for detection and/or diagnosis of SARS-CoV-2 by FDA under an Emergency Use Authorization (EUA).  This EUA will remain in effect (meaning this test can be used) for the duration of  the COVID-19 declaration under Section 564(b)(1) of the Act, 21 U.S.C. section 360bbb-3(b)(1), unless the authorization is terminated or revoked sooner.  Performed at [facility], [place] [address] [place], [place] [address]      Radiology Studies: No results found.   Scheduled Meds:  albuterol  2 puff Inhalation TID   vitamin C  1,000 mg Oral Daily   budesonide (PULMICORT) nebulizer solution  0.5 mg Nebulization BID   empagliflozin  10 mg Oral Daily   enoxaparin (LOVENOX) injection  100 mg Subcutaneous Q24H   ferrous sulfate  325 mg Oral Q breakfast   furosemide  40 mg Intravenous BID   guaiFENesin  1,200 mg Oral BID   ipratropium-albuterol  3 mL Nebulization QID   megestrol  40 mg Oral BID   melatonin  5 mg Oral QHS   methylPREDNISolone (SOLU-MEDROL) injection  80 mg Intravenous Q24H   nicotine  21 mg Transdermal Daily   sodium chloride flush  3 mL Intravenous Q12H   Continuous Infusions:  sodium chloride       LOS: 3 days    Time spent: 74mn    PDomenic Polite MD [facility]   10/25/2021, 10:16 AM

## 2021-10-25 NOTE — Evaluation (Signed)
Occupational Therapy Evaluation Patient Details Name: Renee Paul MRN: [id number] DOB: 1985/10/16 Today's Date: 10/25/2021   History of Present Illness 36 y.o. female with medical history significant of chronic HFpEF, HTN, asthma, sleep apnea, chronic hypoxic respiratory 1 to 2 L as needed, cigarette smoker morbid obesity, chronic iron deficiency anemia secondary to menorrhagia, presented for increasing shortness of breath and leg swelling.   Clinical Impression   Patient admitted for the diagnosis above.  PTA she recently moved to a new apartment, and states she has a full flight to navigate.  Patient states she has children, but she is the one that takes care of everyone else.  Patient works full time with [employer].  She uses oxygen at night, and monitors her O2 sat throughout the day.  Patient states her saturation level drops into the high 70's with any mobility, and that stairs are very difficult for her.  Patient appears to be close to her baseline for ADL completion and in room mobility.  She did drop to 76% on RA, and rebounded quickly with 2 L and a seated rest break.  RT in the room for breathing treatment and PT eval is pending.  Patient is anxious about returning to work, as she does not have anyone that can help her.  Patient encouraged to walk with staff.  No further needs in the acute setting.       Recommendations for follow up therapy are one component of a multi-disciplinary discharge planning process, led by the attending physician.  Recommendations may be updated based on patient status, additional functional criteria and insurance authorization.   Follow Up Recommendations  No OT follow up    Assistance Recommended at Discharge PRN  Patient can return home with the following Help with stairs or ramp for entrance    Functional Status Assessment  Patient has not had a recent decline in their functional status  Equipment Recommendations  None recommended by OT     Recommendations for Other Services       Precautions / Restrictions Precautions Precautions: Other (comment) Precaution Comments: Watch O2 sat Restrictions Weight Bearing Restrictions: No      Mobility Bed Mobility Overal bed mobility: Modified Independent                  Transfers Overall transfer level: Modified independent                        Balance Overall balance assessment: Mild deficits observed, not formally tested                                         ADL either performed or assessed with clinical judgement   ADL Overall ADL's : At baseline                                       General ADL Comments: Increased time and SOB noted     Vision Patient Visual Report: No change from baseline       Perception Perception Perception: Not tested   Praxis Praxis Praxis: Not tested    Pertinent Vitals/Pain Pain Assessment Pain Assessment: No/denies pain     Hand Dominance Right   Extremity/Trunk Assessment Upper Extremity Assessment Upper Extremity Assessment: Overall WFL for tasks assessed  Lower Extremity Assessment Lower Extremity Assessment: Defer to PT evaluation   Cervical / Trunk Assessment Cervical / Trunk Assessment: Normal   Communication Communication Communication: No difficulties   Cognition Arousal/Alertness: Awake/alert Behavior During Therapy: WFL for tasks assessed/performed Overall Cognitive Status: Within Functional Limits for tasks assessed                                       General Comments   Watch HR and O2 sat    Exercises     Shoulder Instructions      Home Living Family/patient expects to be discharged to:: Private residence Living Arrangements: Children Available Help at Discharge: Family;Available PRN/intermittently Type of Home: Apartment Home Access: Stairs to enter Entrance Stairs-Number of Steps: not sure - new apartment.  "a lot"  of stairs. Entrance Stairs-Rails: Can reach both Home Layout: One level     Bathroom Shower/Tub: Teacher, early years/pre: Standard     Home Equipment: None          Prior Functioning/Environment Prior Level of Function : Independent/Modified Independent;Working/employed;Driving               ADLs Comments: No assist with ADL ans iADL.        OT Problem List: Decreased activity tolerance      OT Treatment/Interventions:      OT Goals(Current goals can be found in the care plan section) Acute Rehab OT Goals Patient Stated Goal: Go back to work OT Goal Formulation: With patient Time For Goal Achievement: 11/08/21 Potential to Achieve Goals: Good  OT Frequency:      Co-evaluation              AM-PAC OT "6 Clicks" Daily Activity     Outcome Measure Help from another person eating meals?: None Help from another person taking care of personal grooming?: None Help from another person toileting, which includes using toliet, bedpan, or urinal?: None Help from another person bathing (including washing, rinsing, drying)?: None Help from another person to put on and taking off regular upper body clothing?: None Help from another person to put on and taking off regular lower body clothing?: None 6 Click Score: 24   End of Session Equipment Utilized During Treatment: Oxygen Nurse Communication: Mobility status  Activity Tolerance: Patient tolerated treatment well Patient left: in bed;with call bell/phone within reach  OT Visit Diagnosis: Other (comment) (shortness of breath)                Time: 3474-2595 OT Time Calculation (min): 17 min Charges:  OT General Charges $OT Visit: 1 Visit OT Evaluation $OT Eval Moderate Complexity: 1 Mod  10/25/2021  RP, OTR/L  Acute Rehabilitation Services  Office:  [phone]   Metta Clines 10/25/2021, 3:36 PM

## 2021-10-25 NOTE — Progress Notes (Signed)
PT Cancellation Note  Patient Details Name: Renee Paul MRN: [id number] DOB: 24-Apr-1985   Cancelled Treatment:    Reason Eval/Treat Not Completed: Fatigue/lethargy limiting ability to participate; patient up in the bathroom and reports walked with OT earlier and did check ambulatory SpO2.  Too fatigued to walk again currently.  Will check on pt next day as she has stairs to negotiate.  Wants to get smaller O2 tanks for home.  RN CM made aware.   Reginia Naas 10/25/2021, 3:47 PM Magda Kiel, PT Acute Rehabilitation Services Office:[phone] 10/25/2021

## 2021-10-26 ENCOUNTER — Other Ambulatory Visit (HOSPITAL_COMMUNITY): Payer: Self-pay

## 2021-10-26 DIAGNOSIS — I5033 Acute on chronic diastolic (congestive) heart failure: Secondary | ICD-10-CM | POA: Diagnosis not present

## 2021-10-26 LAB — BASIC METABOLIC PANEL
Anion gap: 7 (ref 5–15)
BUN: 7 mg/dL (ref 6–20)
CO2: 35 mmol/L — ABNORMAL HIGH (ref 22–32)
Calcium: 8.9 mg/dL (ref 8.9–10.3)
Chloride: 94 mmol/L — ABNORMAL LOW (ref 98–111)
Creatinine, Ser: 0.69 mg/dL (ref 0.44–1.00)
GFR, Estimated: >60 mL/min (ref >60)
Glucose, Bld: 159 mg/dL — ABNORMAL HIGH (ref 70–99)
Potassium: 3.6 mmol/L (ref 3.5–5.1)
Sodium: 136 mmol/L (ref 135–145)

## 2021-10-26 MED ORDER — FUROSEMIDE 40 MG PO TABS
40.0000 mg | ORAL_TABLET | Freq: Every day | ORAL | 0 refills | Status: DC
  Filled 2021-10-26: qty 30, 30d supply, fill #0

## 2021-10-26 MED ORDER — FUROSEMIDE 40 MG PO TABS
40.0000 mg | ORAL_TABLET | Freq: Every day | ORAL | Status: DC
  Administered 2021-10-26 – 2021-10-27 (×2): 40 mg via ORAL
  Filled 2021-10-26 (×2): qty 1

## 2021-10-26 MED ORDER — PREDNISONE 20 MG PO TABS
40.0000 mg | ORAL_TABLET | Freq: Every day | ORAL | Status: DC
  Administered 2021-10-26 – 2021-10-27 (×2): 40 mg via ORAL
  Filled 2021-10-26 (×2): qty 2

## 2021-10-26 MED ORDER — NICOTINE 14 MG/24HR TD PT24
14.0000 mg | MEDICATED_PATCH | Freq: Every day | TRANSDERMAL | 0 refills | Status: DC
Start: 2021-10-27 — End: 2021-12-08
  Filled 2021-10-26: qty 28, 28d supply, fill #0

## 2021-10-26 MED ORDER — SPIRONOLACTONE 25 MG PO TABS
25.0000 mg | ORAL_TABLET | Freq: Every day | ORAL | 0 refills | Status: DC
  Filled 2021-10-26: qty 30, 30d supply, fill #0

## 2021-10-26 MED ORDER — PREDNISONE 20 MG PO TABS
20.0000 mg | ORAL_TABLET | Freq: Every day | ORAL | 0 refills | Status: AC
Start: 2021-10-27 — End: 2021-10-30
  Filled 2021-10-26: qty 3, 3d supply, fill #0

## 2021-10-26 MED ORDER — IPRATROPIUM-ALBUTEROL 0.5-2.5 (3) MG/3ML IN SOLN
3.0000 mL | Freq: Two times a day (BID) | RESPIRATORY_TRACT | Status: DC
Start: 2021-10-26 — End: 2021-10-27
  Administered 2021-10-26 – 2021-10-27 (×2): 3 mL via RESPIRATORY_TRACT
  Filled 2021-10-26 (×2): qty 3

## 2021-10-26 MED ORDER — EMPAGLIFLOZIN 10 MG PO TABS
10.0000 mg | ORAL_TABLET | Freq: Every day | ORAL | 0 refills | Status: DC
  Filled 2021-10-26: qty 30, 30d supply, fill #0

## 2021-10-26 NOTE — Plan of Care (Signed)
  Problem: Health Behavior/Discharge Planning: Goal: Ability to manage health-related needs will improve Outcome: Not Progressing   Problem: Activity: Goal: Risk for activity intolerance will decrease Outcome: Not Progressing   Problem: Coping: Goal: Level of anxiety will decrease Outcome: Not Progressing   Problem: Activity: Goal: Capacity to carry out activities will improve Outcome: Not Progressing   Problem: Education: Goal: Knowledge of General Education information will improve Description: Including pain rating scale, medication(s)/side effects and non-pharmacologic comfort measures Outcome: Progressing   Problem: Clinical Measurements: Goal: Ability to maintain clinical measurements within normal limits will improve Outcome: Progressing Goal: Will remain free from infection Outcome: Progressing Goal: Diagnostic test results will improve Outcome: Progressing Goal: Respiratory complications will improve Outcome: Progressing Goal: Cardiovascular complication will be avoided Outcome: Progressing   Problem: Nutrition: Goal: Adequate nutrition will be maintained Outcome: Progressing   Problem: Elimination: Goal: Will not experience complications related to bowel motility Outcome: Progressing Goal: Will not experience complications related to urinary retention Outcome: Progressing   Problem: Pain Managment: Goal: General experience of comfort will improve Outcome: Progressing   Problem: Safety: Goal: Ability to remain free from injury will improve Outcome: Progressing   Problem: Skin Integrity: Goal: Risk for impaired skin integrity will decrease Outcome: Progressing   Problem: Education: Goal: Ability to demonstrate management of disease process will improve Outcome: Progressing Goal: Ability to verbalize understanding of medication therapies will improve Outcome: Progressing Goal: Individualized Educational Video(s) Outcome: Progressing   Problem:  Cardiac: Goal: Ability to achieve and maintain adequate cardiopulmonary perfusion will improve Outcome: Progressing

## 2021-10-26 NOTE — Progress Notes (Signed)
PROGRESS NOTE    Renee Paul  KZL:[id number] DOB: 01/30/1986 DOA: 10/22/2021 PCP: [facility]  36/F with history of chronic diastolic CHF, asthma, tobacco use, morbid obesity with BMI of 82, suspected OSA/OHS presented to the ED with dyspnea and cough congestion wheezing and lower extremity edema. -Chest x-ray noted pulmonary vascular congestion and bilateral interstitial infiltrates -Also noted to be positive for rhinovirus with asthma exacerbation   Subjective: -Breathing better, still some cough, still requiring oxygen  Assessment and Plan:   Acute on chronic diastolic (congestive) heart failure ([place]) -Echo 3/23 with preserved EF of 60-65%, preserved RV function, moderate left atrial dilation -Noncompliant with diuretics at baseline, situation complicated by recent homelessness as well -Diuresing well on IV Lasix, she is 6 L negative, creatinine stable at 0.6 -Transition to oral Lasix, also started on Aldactone and Jardiance  Asthma exacerbation, + rhinovirus -Could have asthma/COPD overlap, long-term smoker -Started on IV steroids yesterday, will transition to prednisone taper, continue DuoNebs  Chronic iron deficiency anemia Patient had uterine bleeding in the past with no evidence of current bleeding or blood loss. Her Hgb is stable at 12,9  Morbid obesity -BMI 82.2, recommended importance of diet and lifestyle modification -Would benefit from referral to obesity clinic  Tobacco abuse -Counseled, continue nicotine patch  DVT prophylaxis: Lovenox Code Status: Full code Family Communication: Discussed patient detail no family at bedside Disposition Plan: Home tomorrow if stable  Consultants:    Procedures:   Antimicrobials:    Objective: Vitals:   10/25/21 1941 10/25/21 2044 10/25/21 2045 10/26/21 0410  BP: (!) 156/87   109/65  Pulse: 99 (!) 58  71  Resp: '20 18  20  '$ Temp: 98.7 F (37.1 C)   98.5 F (36.9 C)  TempSrc:  Oral   Oral  SpO2: 95% 96% 96% 95%  Weight:    (!) 213.9 kg  Height:        Intake/Output Summary (Last 24 hours) at 10/26/2021 1105 Last data filed at 10/25/2021 2127 Gross per 24 hour  Intake 603 ml  Output 1400 ml  Net -797 ml   Filed Weights   10/23/21 0521 10/25/21 0618 10/26/21 0410  Weight: (!) 212.6 kg (!) 210.6 kg (!) 213.9 kg    Examination:  General exam: Morbidly obese chronically ill female sitting up in bed, AAOx3, no distress HEENT: Neck obese unable to assess JVD CVS: S1-S2, regular rhythm Lungs: Improving air movement, no wheezes today Abdomen: Soft, nontender, bowel sounds present Extremities: No edema Skin: No rashes Psychiatry:  Mood & affect appropriate.     Data Reviewed:   CBC: Recent Labs  Lab 10/22/21 0927  WBC 7.0  NEUTROABS 5.1  HGB 12.9  HCT 43.6  MCV 92.8  PLT 390   Basic Metabolic Panel: Recent Labs  Lab 10/22/21 0927 10/23/21 0309 10/24/21 0259 10/25/21 0335 10/26/21 0458  NA 136 138 137 139 136  K 4.1 3.8 3.7 4.1 3.6  CL 100 99 98 97* 94*  CO2 29 31 32 32 35*  GLUCOSE 167* 110* 141* 87 159*  BUN '13 11 11 12 7  '$ CREATININE 0.82 0.82 0.74 0.70 0.69  CALCIUM 8.2* 8.5* 8.3* 8.7* 8.9  MG  --   --   --  2.0  --    GFR: Estimated Creatinine Clearance: 176.1 mL/min (by C-G formula based on SCr of 0.69 mg/dL). Liver Function Tests: Recent Labs  Lab 10/22/21 0927  AST 25  ALT 18  ALKPHOS 50  BILITOT  1.1  PROT 7.9  ALBUMIN 3.1*   Recent Labs  Lab 10/22/21 0927  LIPASE 49   No results for input(s): "AMMONIA" in the last 168 hours. Coagulation Profile: No results for input(s): "INR", "PROTIME" in the last 168 hours. Cardiac Enzymes: No results for input(s): "CKTOTAL", "CKMB", "CKMBINDEX", "TROPONINI" in the last 168 hours. BNP (last 3 results) No results for input(s): "PROBNP" in the last 8760 hours. HbA1C: No results for input(s): "HGBA1C" in the last 72 hours. CBG: No results for input(s): "GLUCAP" in the last  168 hours. Lipid Profile: No results for input(s): "CHOL", "HDL", "LDLCALC", "TRIG", "CHOLHDL", "LDLDIRECT" in the last 72 hours. Thyroid Function Tests: No results for input(s): "TSH", "T4TOTAL", "FREET4", "T3FREE", "THYROIDAB" in the last 72 hours. Anemia Panel: No results for input(s): "VITAMINB12", "FOLATE", "FERRITIN", "TIBC", "IRON", "RETICCTPCT" in the last 72 hours. Urine analysis:    Component Value Date/Time   COLORURINE YELLOW 06/05/2021 0416   APPEARANCEUR CLEAR 06/05/2021 0416   LABSPEC 1.014 06/05/2021 0416   PHURINE 7.0 06/05/2021 0416   GLUCOSEU NEGATIVE 06/05/2021 0416   HGBUR LARGE (A) 06/05/2021 0416   BILIRUBINUR NEGATIVE 06/05/2021 0416   KETONESUR NEGATIVE 06/05/2021 0416   PROTEINUR NEGATIVE 06/05/2021 0416   NITRITE NEGATIVE 06/05/2021 0416   LEUKOCYTESUR NEGATIVE 06/05/2021 0416   Sepsis Labs: '@LABRCNTIP'$ (procalcitonin:4,lacticidven:4)  ) Recent Results (from the past 240 hour(s))  Respiratory (~20 pathogens) panel by PCR     Status: Abnormal   Collection Time: 10/24/21  8:40 PM   Specimen: Nasopharyngeal Swab; Respiratory  Result Value Ref Range Status   Adenovirus NOT DETECTED NOT DETECTED Final   Coronavirus 229E NOT DETECTED NOT DETECTED Final    Comment: (NOTE) The Coronavirus on the Respiratory Panel, DOES NOT test for the novel  Coronavirus (2019 nCoV)    Coronavirus HKU1 NOT DETECTED NOT DETECTED Final   Coronavirus NL63 NOT DETECTED NOT DETECTED Final   Coronavirus OC43 NOT DETECTED NOT DETECTED Final   Metapneumovirus NOT DETECTED NOT DETECTED Final   Rhinovirus / Enterovirus DETECTED (A) NOT DETECTED Final   Influenza A NOT DETECTED NOT DETECTED Final   Influenza B NOT DETECTED NOT DETECTED Final   Parainfluenza Virus 1 NOT DETECTED NOT DETECTED Final   Parainfluenza Virus 2 NOT DETECTED NOT DETECTED Final   Parainfluenza Virus 3 NOT DETECTED NOT DETECTED Final   Parainfluenza Virus 4 NOT DETECTED NOT DETECTED Final   Respiratory  Syncytial Virus NOT DETECTED NOT DETECTED Final   Bordetella pertussis NOT DETECTED NOT DETECTED Final   Bordetella Parapertussis NOT DETECTED NOT DETECTED Final   Chlamydophila pneumoniae NOT DETECTED NOT DETECTED Final   Mycoplasma pneumoniae NOT DETECTED NOT DETECTED Final    Comment: Performed at [facility], [place] [address] [place], [place] [address]  SARS Coronavirus 2 by RT PCR (hospital order, performed in [facility] hospital lab) *cepheid single result test* Anterior Nasal Swab     Status: None   Collection Time: 10/24/21  8:40 PM   Specimen: Anterior Nasal Swab  Result Value Ref Range Status   SARS Coronavirus 2 by RT PCR NEGATIVE NEGATIVE Final    Comment: (NOTE) SARS-CoV-2 target nucleic acids are NOT DETECTED.  The SARS-CoV-2 RNA is generally detectable in upper and lower respiratory specimens during the acute phase of infection. The lowest concentration of SARS-CoV-2 viral copies this assay can detect is 250 copies / mL. A negative result does not preclude SARS-CoV-2 infection and should not be used as the sole basis for treatment or  other patient management decisions.  A negative result may occur with improper specimen collection / handling, submission of specimen other than nasopharyngeal swab, presence of viral mutation(s) within the areas targeted by this assay, and inadequate number of viral copies (<250 copies / mL). A negative result must be combined with clinical observations, patient history, and epidemiological information.  Fact Sheet for Patients:   https://www.patel.info/  Fact Sheet for Healthcare Providers: https://hall.com/  This test is not yet approved or  cleared by the [place] FDA and has been authorized for detection and/or diagnosis of SARS-CoV-2 by FDA under an Emergency Use Authorization (EUA).  This EUA will remain in effect (meaning this test can be used) for the duration of  the COVID-19 declaration under Section 564(b)(1) of the Act, 21 U.S.C. section 360bbb-3(b)(1), unless the authorization is terminated or revoked sooner.  Performed at [facility], [place] [address], [place], [place] [address]      Radiology Studies: No results found.   Scheduled Meds:  vitamin C  1,000 mg Oral Daily   budesonide (PULMICORT) nebulizer solution  0.5 mg Nebulization BID   empagliflozin  10 mg Oral Daily   enoxaparin (LOVENOX) injection  100 mg Subcutaneous Q24H   ferrous sulfate  325 mg Oral Q breakfast   furosemide  40 mg Oral Daily   guaiFENesin  1,200 mg Oral BID   ipratropium-albuterol  3 mL Nebulization QID   megestrol  40 mg Oral BID   melatonin  5 mg Oral QHS   nicotine  21 mg Transdermal Daily   predniSONE  40 mg Oral Q breakfast   sodium chloride flush  3 mL Intravenous Q12H   spironolactone  25 mg Oral Daily   Continuous Infusions:  sodium chloride       LOS: 4 days    Time spent: 43mn    PDomenic Polite MD [facility]   10/26/2021, 11:05 AM

## 2021-10-26 NOTE — Progress Notes (Signed)
Physical Therapy Evaluation Patient Details Name: Renee Paul MRN: [id number] DOB: 1986-02-11 Today's Date: 10/26/2021  History of Present Illness  36 y.o. female with medical history significant of chronic HFpEF, HTN, asthma, sleep apnea, chronic hypoxic respiratory 1 to 2 L as needed, cigarette smoker morbid obesity, chronic iron deficiency anemia secondary to menorrhagia, presented for increasing shortness of breath and leg swelling.  Clinical Impression  Pt was seen for evaluation of mobility in which pt was reporting limited sleep last night and did not initially want to be interrupted.  Pt finally agreed to move and try steps as this is her significant challenge for home.  Pt was able to climb steps and talked with her about rushing the task which resulted in SOB and drop to 89% on O2.  Pt is appropriate for HHPT and for further management of endurance with sats checked in that environment.  Follow acutely for goals of PT with focus on endurance and O2 management.  Pt will need a tank for home and work along with handling steps.      Recommendations for follow up therapy are one component of a multi-disciplinary discharge planning process, led by the attending physician.  Recommendations may be updated based on patient status, additional functional criteria and insurance authorization.  Follow Up Recommendations Home health PT      Assistance Recommended at Discharge Set up Supervision/Assistance  Patient can return home with the following  A little help with walking and/or transfers;A little help with bathing/dressing/bathroom;Assistance with cooking/housework;Help with stairs or ramp for entrance    Equipment Recommendations None recommended by PT  Recommendations for Other Services       Functional Status Assessment Patient has had a recent decline in their functional status and demonstrates the ability to make significant improvements in function in a reasonable and predictable  amount of time.     Precautions / Restrictions Precautions Precautions: Other (comment) (irritated by PT asking her to move) Precaution Comments: Watch O2 sat Restrictions Weight Bearing Restrictions: No Other Position/Activity Restrictions: droplet precautions      Mobility  Bed Mobility Overal bed mobility: Modified Independent                  Transfers Overall transfer level: Modified independent                      Ambulation/Gait Ambulation/Gait assistance: Supervision Gait Distance (Feet): 5 Feet Assistive device: None            Stairs Stairs: Yes Stairs assistance: Supervision Stair Management: Step to pattern, Forwards, Backwards Number of Stairs: 10 General stair comments: pt rushed with the task despite being asked to slow down, HR up to 133 and difficulty getting O2 sat immediately.  Has 95% baseline and 89% post activity but was SOB before reading was obtained  Wheelchair Mobility    Modified Rankin (Stroke Patients Only)       Balance Overall balance assessment: Needs assistance Sitting-balance support: Feet supported Sitting balance-Leahy Scale: Good     Standing balance support: No upper extremity supported Standing balance-Leahy Scale: Good Standing balance comment: fair dynamically                             Pertinent Vitals/Pain Pain Assessment Pain Assessment: No/denies pain    Home Living Family/patient expects to be discharged to:: Private residence Living Arrangements: Children Available Help at Discharge: Family;Available PRN/intermittently Type of Home:  Apartment Home Access: Stairs to enter Entrance Stairs-Rails: Can reach both Entrance Stairs-Number of Steps: not sure - new apartment.  "a lot" of stairs.   Home Layout: One level Home Equipment: None Additional Comments: can climb steps in room with no HHA but very SOB    Prior Function Prior Level of Function : Independent/Modified  Independent;Working/employed;Driving             Mobility Comments: no AD needed       Hand Dominance   Dominant Hand: Right    Extremity/Trunk Assessment   Upper Extremity Assessment Upper Extremity Assessment: Defer to OT evaluation    Lower Extremity Assessment Lower Extremity Assessment: Overall WFL for tasks assessed    Cervical / Trunk Assessment Cervical / Trunk Assessment: Normal  Communication   Communication: No difficulties  Cognition Arousal/Alertness: Awake/alert Behavior During Therapy: Agitated Overall Cognitive Status: Within Functional Limits for tasks assessed                                 General Comments: pt was unhappy with PT coming to see her, reports she asked not to be bothered for any "visitors".  Talked with her about PT not being a visitor        General Comments General comments (skin integrity, edema, etc.): Pt is SOB even on O2 but has COPD history and is current smoker.  Has been able to get up steps but rushed the task, requiring O2 on room line.  Will need to take a tank at home    Exercises     Assessment/Plan    PT Assessment Patient needs continued PT services  PT Problem List Cardiopulmonary status limiting activity;Decreased activity tolerance;Decreased safety awareness;Obesity       PT Treatment Interventions DME instruction;Gait training;Stair training;Functional mobility training;Therapeutic activities;Therapeutic exercise;Balance training;Neuromuscular re-education;Patient/family education    PT Goals (Current goals can be found in the Care Plan section)  Acute Rehab PT Goals Patient Stated Goal: to get some rest PT Goal Formulation: With patient Time For Goal Achievement: 11/02/21 Potential to Achieve Goals: Good    Frequency Min 3X/week     Co-evaluation               AM-PAC PT "6 Clicks" Mobility  Outcome Measure Help needed turning from your back to your side while in a flat bed  without using bedrails?: None Help needed moving from lying on your back to sitting on the side of a flat bed without using bedrails?: None Help needed moving to and from a bed to a chair (including a wheelchair)?: None Help needed standing up from a chair using your arms (e.g., wheelchair or bedside chair)?: None Help needed to walk in hospital room?: A Little Help needed climbing 3-5 steps with a railing? : A Little 6 Click Score: 22    End of Session Equipment Utilized During Treatment: Oxygen Activity Tolerance: Patient limited by fatigue;Treatment limited secondary to medical complications (Comment) Patient left: in bed;with call bell/phone within reach Nurse Communication: Mobility status PT Visit Diagnosis: Difficulty in walking, not elsewhere classified (R26.2)    Time: 2409-7353 PT Time Calculation (min) (ACUTE ONLY): 25 min   Charges:   PT Evaluation $PT Eval Moderate Complexity: 1 Mod PT Treatments $Gait Training: 8-22 mins       Ramond Dial 10/26/2021, 12:50 PM  Mee Hives, PT PhD Acute Rehab Dept. Number: [place] and [place]

## 2021-10-26 NOTE — TOC Transition Note (Signed)
Transition of Care [facility]) - CM/SW Discharge Note   Patient Details  Name: Renee Paul MRN: [id number] Date of Birth: 12-19-1985  [facility] CM/SW Contact:  Zenon Mayo, RN Phone Number: 10/26/2021, 11:55 AM   Clinical Narrative:    Patient is for possible dc today, has transportation at dc.  No needs.      Barriers to Discharge: Continued Medical Work up   Patient Goals and CMS Choice        Discharge Placement                       Discharge Plan and Services   Discharge Planning Services: CM Consult                                 Social Determinants of Health (SDOH) Interventions     Readmission Risk Interventions     No data to display

## 2021-10-26 NOTE — Plan of Care (Signed)

## 2021-10-26 NOTE — Progress Notes (Signed)
Heart Failure Stewardship Pharmacist Progress Note   PCP: [facility] PCP-Cardiologist: None    HPI:  36 yo F with PMH of HFpEF, morbid obesity, untreated OSA, asthma, and uterine bleeding.  Admitted in 05/2021 with acute on chronic CHF and abnormal uterine bleeding. She was diuresed and discharged with lasix 80 mg daily. Difficult socioeconomic situation.   She was admitted to [facility] on 10/09/21 with acute on chronic hypoxic respiratory failure with hypercarbia. She reported noncompliance with home oxygen due to temporary homelessness. She was discharged with lasix 80 mg daily and metolazone 2.5 mg every other day PRN.  She presented back to the ED on 10/14/21 with shortness of breath and LEE. Had not been taking medications because she reported she was unable to afford them. Copay for her meds were around $3 each, and the family stated if they had known they were this amount, they would have paid for them. Discharged from the ED and encouraged to pick up medications.  She presented again to the ED on 10/22/21 with cough, shortness of breath, orthopnea, and chest discomfort. She states she hadn't taken her diuretic in 1 week. She had lost the medications given to her at last discharge. CXR with cardiomegaly and pulmonary vascular congestion. She was admitted with CHF exacerbation in the setting of medication noncompliance.   Current HF Medications: Diuretic: furosemide 40 mg PO daily MRA: spironolactone 25 mg daily SGLT2i: Jardiance 10 mg daily  Prior to admission HF Medications: Diuretic: furosemide 80 mg daily + metolazone 2.5 mg daily PRN  Pertinent Lab Values: Serum creatinine 0.69, BUN 7, Potassium 3.6, Sodium 136, BNP 135.7, Magnesium 2.0  Vital Signs: Weight: 471 lbs (admission weight: 469 lbs) Blood pressure: 110/60s  Heart rate: 60-80s  I/O: -1.4L yesterday; net -6.1L  Medication Assistance / Insurance Benefits Check: Does the patient have  prescription insurance?  Yes Type of insurance plan: [place]:  Prior to admission outpatient [facility] Is the patient willing to use [facility] at discharge? Yes Is the patient willing to transition their outpatient pharmacy to utilize a [facility] outpatient pharmacy?   Pending    Assessment: 1. Acute on chronic diastolic CHF (LVEF 79-48%), due to presumed NICM. NYHA class III symptoms. - Agree with transitioning to PO furosemide 40 mg daily. Strict I/Os. Daily weights. Keep K>4 and Mag>2.  - Consider adding Entresto 24/26 mg BID for HFpEF optimization if BP increases - Continue spironolactone 25 mg daily - Continue Jardiance 10 mg daily  Plan: 1) Medication changes recommended at this time: - Agree with changes  2) Patient assistance: - Insurance not contracted with [facility] - unable to run copay checks - Upon further investigation, copays are around $3  3)  Education  - Patient has been educated on current HF medications and potential additions to HF medication regimen - Patient verbalizes understanding that over the next few months, these medication doses may change and more medications may be added to optimize HF regimen - Patient has been educated on basic disease state pathophysiology and goals of therapy   Kerby Nora, PharmD, BCPS Heart Failure Cytogeneticist Phone [phone]

## 2021-10-27 ENCOUNTER — Other Ambulatory Visit: Payer: Self-pay

## 2021-10-27 DIAGNOSIS — I5033 Acute on chronic diastolic (congestive) heart failure: Secondary | ICD-10-CM | POA: Diagnosis not present

## 2021-10-27 LAB — BASIC METABOLIC PANEL
Anion gap: 7 (ref 5–15)
BUN: 10 mg/dL (ref 6–20)
CO2: 33 mmol/L — ABNORMAL HIGH (ref 22–32)
Calcium: 8.8 mg/dL — ABNORMAL LOW (ref 8.9–10.3)
Chloride: 97 mmol/L — ABNORMAL LOW (ref 98–111)
Creatinine, Ser: 0.65 mg/dL (ref 0.44–1.00)
GFR, Estimated: >60 mL/min (ref >60)
Glucose, Bld: 118 mg/dL — ABNORMAL HIGH (ref 70–99)
Potassium: 3.8 mmol/L (ref 3.5–5.1)
Sodium: 137 mmol/L (ref 135–145)

## 2021-10-27 NOTE — Plan of Care (Signed)

## 2021-10-28 NOTE — Discharge Summary (Signed)
Physician Discharge Summary  Renee Paul FIE:[id number] DOB: 12-28-1985 DOA: 10/22/2021  PCP: [facility], [place] date: 10/22/2021 Discharge date: 10/27/2021  Time spent: 35  minutes  Recommendations for Outpatient Follow-up:  CHF ToC clinic PCP in 1 week, smoking cessation counseling Needs referral to obesity clinic   Discharge Diagnoses:  Principal Problem:   Acute on chronic diastolic (congestive) heart failure (HCC) Active Problems:   Chronic iron deficiency anemia   Acute asthma exacerbation Morbid obesity Tobacco abuse  Discharge Condition: Improved  Diet recommendation: Low-sodium, heart healthy  Filed Weights   10/25/21 0618 10/26/21 0410 10/27/21 0500  Weight: (!) 210.6 kg (!) 213.9 kg (!) 213.7 kg    History of present illness:  36/F with history of chronic diastolic CHF, asthma, tobacco use, morbid obesity with BMI of 82, suspected OSA/OHS presented to the ED with dyspnea and cough congestion wheezing and lower extremity edema. -Chest x-ray noted pulmonary vascular congestion and bilateral interstitial infiltrates -Also noted to be positive for rhinovirus with asthma exacerbation  Hospital Course:   Acute on chronic diastolic (congestive) heart failure ([place]) -Echo 3/23 with preserved EF of 60-65%, preserved RV function, moderate left atrial dilation -Noncompliant with diuretics at baseline, situation complicated by recent homelessness as well -Diuresing well on IV Lasix, she is 9 L negative, creatinine stable at 0.6 -Transitioned to oral Lasix, also started on Aldactone and Jardiance.  She refused to take Jardiance after discharge, history of UTI in the past. -CHF TOC clinic follow-up in 1 week, titrate GDMT as tolerated   Asthma exacerbation, + rhinovirus -Could have asthma/COPD overlap, long-term smoker -Treated with IV steroids and then transitioned to prednisone taper, continue DuoNebs   Chronic iron deficiency  anemia Patient had uterine bleeding in the past with no evidence of current bleeding or blood loss. Her Hgb is stable at 12,9   Morbid obesity -BMI 82.2, recommended importance of diet and lifestyle modification -Would benefit from referral to obesity clinic   Tobacco abuse -Counseled, continue nicotine patch  Discharge Exam: Vitals:   10/26/21 2030 10/27/21 0732  BP: 130/72   Pulse: 69   Resp: 20   Temp: 99.4 F (37.4 C)   SpO2: 99% 97%   General exam: Morbidly obese chronically ill female sitting up in bed, AAOx3, no distress HEENT: Neck obese unable to assess JVD CVS: S1-S2, regular rhythm Lungs: Improving air movement, no wheezes today Abdomen: Soft, nontender, bowel sounds present Extremities: No edema Skin: No rashes Psychiatry:  Mood & affect appropriate.   Discharge Instructions   Discharge Instructions     Diet - low sodium heart healthy   Complete by: As directed    Increase activity slowly   Complete by: As directed       Allergies as of 10/27/2021   No Known Allergies      Medication List     STOP taking these medications    metolazone 2.5 MG tablet Commonly known as: ZAROXOLYN   vitamin C 1000 MG tablet       TAKE these medications    albuterol 108 (90 Base) MCG/ACT inhaler Commonly known as: VENTOLIN HFA Inhale 2 puffs into the lungs every 4 (four) hours as needed for wheezing or shortness of breath.   ferrous sulfate 325 (65 FE) MG tablet Take 1 tablet (325 mg total) by mouth daily with breakfast.   furosemide 40 MG tablet Commonly known as: LASIX Take 1 tablet (40 mg total) by mouth daily. What changed: how much  to take   megestrol 40 MG tablet Commonly known as: MEGACE Take 1 tablet (40 mg total) by mouth 2 (two) times daily.   nicotine 14 mg/24hr patch Commonly known as: NICODERM CQ - dosed in mg/24 hours Place 1 patch (14 mg total) onto the skin daily.   predniSONE 20 MG tablet Commonly known as: DELTASONE Take 1  tablet (20 mg total) by mouth daily with breakfast for 3 days.   spironolactone 25 MG tablet Commonly known as: ALDACTONE Take 1 tablet (25 mg total) by mouth daily.       No Known Allergies    The results of significant diagnostics from this hospitalization (including imaging, microbiology, ancillary and laboratory) are listed below for reference.    Significant Diagnostic Studies: DG Chest 1 View  Result Date: 10/23/2021 CLINICAL DATA:  Congestive heart failure. EXAM: CHEST  1 VIEW COMPARISON:  October 22, 2021. FINDINGS: Mild cardiomegaly is noted with central pulmonary vascular congestion. Mild bibasilar pulmonary edema may be present. The visualized skeletal structures are unremarkable. IMPRESSION: Mild cardiomegaly with central pulmonary vascular congestion. Mild bibasilar pulmonary edema may be present. Electronically Signed   By: Marijo Conception M.D.   On: 10/23/2021 08:14   DG Chest Portable 1 View  Result Date: 10/22/2021 CLINICAL DATA:  Provided history: Shortness of breath. Additional history provided: Cough, chest tightness, history of asthma, history of CHF. EXAM: PORTABLE CHEST 1 VIEW COMPARISON:  Prior chest radiographs October 14, 2021 and earlier. FINDINGS: Cardiomegaly with central pulmonary vascular congestion. Mild prominence of the interstitial lung markings within the mid-to-lower lung fields. No evidence of pleural effusion or pneumothorax. No acute bony abnormality identified. IMPRESSION: Cardiomegaly with central pulmonary vascular congestion. Mild prominence of the interstitial lung markings within the mid-to-lower lung fields. This may reflect pulmonary interstitial edema. However, atypical/viral pneumonia can of a similar imaging appearance and clinical correlation is recommended. Electronically Signed   By: Kellie Simmering D.O.   On: 10/22/2021 09:48    Microbiology: Recent Results (from the past 240 hour(s))  Respiratory (~20 pathogens) panel by PCR     Status: Abnormal    Collection Time: 10/24/21  8:40 PM   Specimen: Nasopharyngeal Swab; Respiratory  Result Value Ref Range Status   Adenovirus NOT DETECTED NOT DETECTED Final   Coronavirus 229E NOT DETECTED NOT DETECTED Final    Comment: (NOTE) The Coronavirus on the Respiratory Panel, DOES NOT test for the novel  Coronavirus (2019 nCoV)    Coronavirus HKU1 NOT DETECTED NOT DETECTED Final   Coronavirus NL63 NOT DETECTED NOT DETECTED Final   Coronavirus OC43 NOT DETECTED NOT DETECTED Final   Metapneumovirus NOT DETECTED NOT DETECTED Final   Rhinovirus / Enterovirus DETECTED (A) NOT DETECTED Final   Influenza A NOT DETECTED NOT DETECTED Final   Influenza B NOT DETECTED NOT DETECTED Final   Parainfluenza Virus 1 NOT DETECTED NOT DETECTED Final   Parainfluenza Virus 2 NOT DETECTED NOT DETECTED Final   Parainfluenza Virus 3 NOT DETECTED NOT DETECTED Final   Parainfluenza Virus 4 NOT DETECTED NOT DETECTED Final   Respiratory Syncytial Virus NOT DETECTED NOT DETECTED Final   Bordetella pertussis NOT DETECTED NOT DETECTED Final   Bordetella Parapertussis NOT DETECTED NOT DETECTED Final   Chlamydophila pneumoniae NOT DETECTED NOT DETECTED Final   Mycoplasma pneumoniae NOT DETECTED NOT DETECTED Final    Comment: Performed at [facility], [address] [place], [place] [address]  SARS Coronavirus 2 by RT PCR (hospital order, performed in [facility] hospital lab) *cepheid single result test* Anterior Nasal Swab     Status: None   Collection Time: 10/24/21  8:40 PM   Specimen: Anterior Nasal Swab  Result Value Ref Range Status   SARS Coronavirus 2 by RT PCR NEGATIVE NEGATIVE Final    Comment: (NOTE) SARS-CoV-2 target nucleic acids are NOT DETECTED.  The SARS-CoV-2 RNA is generally detectable in upper and lower respiratory specimens during the acute phase of infection. The lowest concentration of SARS-CoV-2 viral copies this assay can detect is 250 copies / mL. A negative result does not preclude  SARS-CoV-2 infection and should not be used as the sole basis for treatment or other patient management decisions.  A negative result may occur with improper specimen collection / handling, submission of specimen other than nasopharyngeal swab, presence of viral mutation(s) within the areas targeted by this assay, and inadequate number of viral copies (<250 copies / mL). A negative result must be combined with clinical observations, patient history, and epidemiological information.  Fact Sheet for Patients:   https://www.patel.info/  Fact Sheet for Healthcare Providers: https://hall.com/  This test is not yet approved or  cleared by the [place] FDA and has been authorized for detection and/or diagnosis of SARS-CoV-2 by FDA under an Emergency Use Authorization (EUA).  This EUA will remain in effect (meaning this test can be used) for the duration of the COVID-19 declaration under Section 564(b)(1) of the Act, 21 U.S.C. section 360bbb-3(b)(1), unless the authorization is terminated or revoked sooner.  Performed at [facility], [place] [address], [place], [place] [address]      Labs: Basic Metabolic Panel: Recent Labs  Lab 10/23/21 0309 10/24/21 0259 10/25/21 0335 10/26/21 0458 10/27/21 0225  NA 138 137 139 136 137  K 3.8 3.7 4.1 3.6 3.8  CL 99 98 97* 94* 97*  CO2 31 32 32 35* 33*  GLUCOSE 110* 141* 87 159* 118*  BUN '11 11 12 7 10  '$ CREATININE 0.82 0.74 0.70 0.69 0.65  CALCIUM 8.5* 8.3* 8.7* 8.9 8.8*  MG  --   --  2.0  --   --    Liver Function Tests: Recent Labs  Lab 10/22/21 0927  AST 25  ALT 18  ALKPHOS 50  BILITOT 1.1  PROT 7.9  ALBUMIN 3.1*   Recent Labs  Lab 10/22/21 0927  LIPASE 49   No results for input(s): "AMMONIA" in the last 168 hours. CBC: Recent Labs  Lab 10/22/21 0927  WBC 7.0  NEUTROABS 5.1  HGB 12.9  HCT 43.6  MCV 92.8  PLT 275   Cardiac Enzymes: No results for input(s):  "CKTOTAL", "CKMB", "CKMBINDEX", "TROPONINI" in the last 168 hours. BNP: BNP (last 3 results) Recent Labs    03/21/21 1541 06/04/21 2221 10/22/21 0927  BNP 58.5 50.0 135.7*    ProBNP (last 3 results) No results for input(s): "PROBNP" in the last 8760 hours.  CBG: No results for input(s): "GLUCAP" in the last 168 hours.     Signed:  Domenic Polite MD.  [facility] 10/28/2021, 10:54 AM

## 2021-10-29 ENCOUNTER — Other Ambulatory Visit (HOSPITAL_COMMUNITY): Payer: Self-pay

## 2021-10-30 ENCOUNTER — Other Ambulatory Visit (HOSPITAL_COMMUNITY): Payer: Self-pay

## 2021-10-31 ENCOUNTER — Emergency Department (HOSPITAL_BASED_OUTPATIENT_CLINIC_OR_DEPARTMENT_OTHER)
Admission: EM | Admit: 2021-10-31 | Discharge: 2021-11-01 | Discharge status: Against Medical Advice | Payer: No Typology Code available for payment source | Source: Home / Self Care | Attending: Emergency Medicine | Admitting: Emergency Medicine

## 2021-10-31 ENCOUNTER — Emergency Department (HOSPITAL_BASED_OUTPATIENT_CLINIC_OR_DEPARTMENT_OTHER): Payer: No Typology Code available for payment source

## 2021-10-31 ENCOUNTER — Other Ambulatory Visit (HOSPITAL_COMMUNITY): Payer: Self-pay

## 2021-10-31 ENCOUNTER — Other Ambulatory Visit: Payer: Self-pay

## 2021-10-31 ENCOUNTER — Encounter (HOSPITAL_BASED_OUTPATIENT_CLINIC_OR_DEPARTMENT_OTHER): Payer: Self-pay | Admitting: Urology

## 2021-10-31 DIAGNOSIS — I5033 Acute on chronic diastolic (congestive) heart failure: Secondary | ICD-10-CM | POA: Diagnosis present

## 2021-10-31 DIAGNOSIS — R0902 Hypoxemia: Secondary | ICD-10-CM | POA: Diagnosis not present

## 2021-10-31 DIAGNOSIS — J9621 Acute and chronic respiratory failure with hypoxia: Secondary | ICD-10-CM | POA: Insufficient documentation

## 2021-10-31 DIAGNOSIS — E119 Type 2 diabetes mellitus without complications: Secondary | ICD-10-CM | POA: Insufficient documentation

## 2021-10-31 DIAGNOSIS — J45909 Unspecified asthma, uncomplicated: Secondary | ICD-10-CM | POA: Insufficient documentation

## 2021-10-31 DIAGNOSIS — I110 Hypertensive heart disease with heart failure: Secondary | ICD-10-CM | POA: Insufficient documentation

## 2021-10-31 DIAGNOSIS — Z5321 Procedure and treatment not carried out due to patient leaving prior to being seen by health care provider: Secondary | ICD-10-CM | POA: Insufficient documentation

## 2021-10-31 DIAGNOSIS — J4541 Moderate persistent asthma with (acute) exacerbation: Secondary | ICD-10-CM | POA: Diagnosis not present

## 2021-10-31 LAB — COMPREHENSIVE METABOLIC PANEL
ALT: 18 U/L (ref 0–44)
AST: 15 U/L (ref 15–41)
Albumin: 3.5 g/dL (ref 3.5–5.0)
Alkaline Phosphatase: 49 U/L (ref 38–126)
Anion gap: 9 (ref 5–15)
BUN: 10 mg/dL (ref 6–20)
CO2: 30 mmol/L (ref 22–32)
Calcium: 8.4 mg/dL — ABNORMAL LOW (ref 8.9–10.3)
Chloride: 100 mmol/L (ref 98–111)
Creatinine, Ser: 0.77 mg/dL (ref 0.44–1.00)
GFR, Estimated: >60 mL/min (ref >60)
Glucose, Bld: 95 mg/dL (ref 70–99)
Potassium: 3.9 mmol/L (ref 3.5–5.1)
Sodium: 139 mmol/L (ref 135–145)
Total Bilirubin: 0.9 mg/dL (ref 0.3–1.2)
Total Protein: 8.2 g/dL — ABNORMAL HIGH (ref 6.5–8.1)

## 2021-10-31 LAB — CBC WITH DIFFERENTIAL/PLATELET
Abs Immature Granulocytes: 0.03 10*3/uL (ref 0.00–0.07)
Basophils Absolute: 0 10*3/uL (ref 0.0–0.1)
Basophils Relative: 0 %
Eosinophils Absolute: 0.2 10*3/uL (ref 0.0–0.5)
Eosinophils Relative: 3 %
HCT: 44.7 % (ref 36.0–46.0)
Hemoglobin: 13.2 g/dL (ref 12.0–15.0)
Immature Granulocytes: 0 %
Lymphocytes Relative: 17 %
Lymphs Abs: 1.5 10*3/uL (ref 0.7–4.0)
MCH: 27.3 pg (ref 26.0–34.0)
MCHC: 29.5 g/dL — ABNORMAL LOW (ref 30.0–36.0)
MCV: 92.5 fL (ref 80.0–100.0)
Monocytes Absolute: 0.3 10*3/uL (ref 0.1–1.0)
Monocytes Relative: 4 %
Neutro Abs: 6.8 10*3/uL (ref 1.7–7.7)
Neutrophils Relative %: 76 %
Platelets: 50 10*3/uL — ABNORMAL LOW (ref 150–400)
RBC: 4.83 MIL/uL (ref 3.87–5.11)
RDW: 17.3 % — ABNORMAL HIGH (ref 11.5–15.5)
WBC: 8.9 10*3/uL (ref 4.0–10.5)
nRBC: 0 % (ref 0.0–0.2)

## 2021-10-31 MED ORDER — METHYLPREDNISOLONE SODIUM SUCC 125 MG IJ SOLR
125.0000 mg | Freq: Once | INTRAMUSCULAR | Status: AC
  Administered 2021-10-31: 125 mg via INTRAVENOUS
  Filled 2021-10-31 (×2): qty 2

## 2021-10-31 MED ORDER — ASPIRIN 81 MG PO CHEW
324.0000 mg | CHEWABLE_TABLET | Freq: Once | ORAL | Status: AC
Start: 2021-10-31 — End: 2021-10-31
  Administered 2021-10-31: 324 mg via ORAL
  Filled 2021-10-31: qty 4

## 2021-10-31 MED ORDER — FUROSEMIDE 10 MG/ML IJ SOLN
40.0000 mg | Freq: Once | INTRAMUSCULAR | Status: AC
Start: 2021-10-31 — End: 2021-10-31
  Administered 2021-10-31: 40 mg via INTRAVENOUS
  Filled 2021-10-31 (×2): qty 4

## 2021-10-31 MED ORDER — IPRATROPIUM-ALBUTEROL 0.5-2.5 (3) MG/3ML IN SOLN
3.0000 mL | Freq: Once | RESPIRATORY_TRACT | Status: AC
  Administered 2021-10-31: 3 mL via RESPIRATORY_TRACT
  Filled 2021-10-31: qty 3

## 2021-10-31 NOTE — ED Provider Notes (Signed)
[facility] Provider Note   CSN: [id number] Arrival date & time: 10/31/21  2211     History  Chief Complaint  Patient presents with   Shortness of Breath    Renee Paul is a 36 y.o. female.  Level 5 caveat for respiratory distress.  Patient with a history of obesity, diabetes, hypertension, diastolic CHF and asthma presenting with difficulty breathing, cough and chest tightness 3 days ago.  She was discharged from the hospital 4 days ago after stay for CHF exacerbation diuresed over 9 L.  She did complete a course of steroids for asthma exacerbation and rhinovirus.  States over the past 2 days she had increasing shortness of breath with productive cough with mucus and chest tightness.  No fever.  O2 saturation 83% on arrival.  Has CPAP at home but is not using it because it is in storage.  Does not wear oxygen during the day and does not have a portable oxygen tank.  Denies any fever.  Denies abdominal pain, nausea or vomiting.  States compliance with her medications.  No change in her chronic leg swelling  The history is provided by the patient and a relative. The history is limited by the condition of the patient.  Shortness of Breath Associated symptoms: no fever        Home Medications Prior to Admission medications   Medication Sig Start Date End Date Taking? Authorizing Provider  albuterol (PROVENTIL HFA;VENTOLIN HFA) 108 (90 Base) MCG/ACT inhaler Inhale 2 puffs into the lungs every 4 (four) hours as needed for wheezing or shortness of breath. 09/06/16   Orpah Greek, MD  ferrous sulfate 325 (65 FE) MG tablet Take 1 tablet (325 mg total) by mouth daily with breakfast. 06/10/21   Patrecia Pour, MD  furosemide (LASIX) 40 MG tablet Take 1 tablet (40 mg total) by mouth daily. 10/26/21   Domenic Polite, MD  megestrol (MEGACE) 40 MG tablet Take 1 tablet (40 mg total) by mouth 2 (two) times daily. 06/10/21   Patrecia Pour, MD  nicotine (NICODERM  CQ - DOSED IN MG/24 HOURS) 14 mg/24hr patch Place 1 patch (14 mg total) onto the skin daily. 10/27/21   Domenic Polite, MD  spironolactone (ALDACTONE) 25 MG tablet Take 1 tablet (25 mg total) by mouth daily. 10/27/21   Domenic Polite, MD      Allergies    Patient has no known allergies.    Review of Systems   Review of Systems  Constitutional:  Negative for chills and fever.  Respiratory:  Positive for chest tightness and shortness of breath.     all other systems are negative except as noted in the HPI and PMH.   Physical Exam Updated Vital Signs BP (!) 148/104 (BP Location: Left Arm)   Pulse 95   Temp 98 F (36.7 C)   Resp (!) 24   Ht '5\' 3"'$  (1.6 m)   Wt (!) 213.7 kg   LMP  (LMP Unknown)   SpO2 95%   BMI 83.46 kg/m  Physical Exam Vitals and nursing note reviewed.  Constitutional:      General: She is not in acute distress.    Appearance: She is well-developed. She is obese. She is ill-appearing.  HENT:     Head: Normocephalic and atraumatic.     Mouth/Throat:     Pharynx: No oropharyngeal exudate.  Eyes:     Conjunctiva/sclera: Conjunctivae normal.     Pupils: Pupils are equal, round, and  reactive to light.  Neck:     Comments: No meningismus. Cardiovascular:     Rate and Rhythm: Normal rate and regular rhythm.     Heart sounds: Normal heart sounds. No murmur heard. Pulmonary:     Effort: Pulmonary effort is normal. No respiratory distress.     Breath sounds: Rhonchi present.     Comments: Dyspneic with conversation, speaking in short phrases, scattered rhonchi through Abdominal:     Palpations: Abdomen is soft.     Tenderness: There is no abdominal tenderness. There is no guarding or rebound.  Musculoskeletal:        General: No tenderness. Normal range of motion.     Cervical back: Normal range of motion and neck supple.  Skin:    General: Skin is warm.  Neurological:     Mental Status: She is alert and oriented to person, place, and time.     Cranial  Nerves: No cranial nerve deficit.     Motor: No abnormal muscle tone.     Coordination: Coordination normal.     Comments:  5/5 strength throughout. CN 2-12 intact.Equal grip strength.   Psychiatric:        Behavior: Behavior normal.     ED Results / Procedures / Treatments   Labs (all labs ordered are listed, but only abnormal results are displayed) Labs Reviewed  CBC WITH DIFFERENTIAL/PLATELET - Abnormal; Notable for the following components:      Result Value   MCHC 29.5 (*)    RDW 17.3 (*)    Platelets 50 (*)    All other components within normal limits  COMPREHENSIVE METABOLIC PANEL  BRAIN NATRIURETIC PEPTIDE  TROPONIN I (HIGH SENSITIVITY)    EKG None  Radiology DG Chest Portable 1 View  Result Date: 10/31/2021 CLINICAL DATA:  CHF, cough for 2 days, worsening shortness of breath EXAM: PORTABLE CHEST 1 VIEW COMPARISON:  10/23/2021 FINDINGS: Single frontal view of the chest was obtained, limited by portable AP technique and patient body habitus. The cardiac silhouette is enlarged. Central vascular congestion without evidence of airspace disease, effusion, or pneumothorax. No acute bony abnormality. IMPRESSION: 1. Limited study due to portable technique and body habitus. 2. Persistent cardiomegaly and central vascular congestion. No overt edema. Electronically Signed   By: Randa Ngo M.D.   On: 10/31/2021 22:53    Procedures .Critical Care  Performed by: Ezequiel Essex, MD Authorized by: Ezequiel Essex, MD   Critical care provider statement:    Critical care time (minutes):  30   Critical care time was exclusive of:  Separately billable procedures and treating other patients   Critical care was necessary to treat or prevent imminent or life-threatening deterioration of the following conditions:  Respiratory failure   Critical care was time spent personally by me on the following activities:  Development of treatment plan with patient or surrogate, discussions with  consultants, evaluation of patient's response to treatment, examination of patient, ordering and review of laboratory studies, ordering and review of radiographic studies, ordering and performing treatments and interventions, pulse oximetry, re-evaluation of patient's condition and review of old charts   I assumed direction of critical care for this patient from another provider in my specialty: no     Care discussed with: admitting provider   Ultrasound ED Peripheral IV (Provider)  Date/Time: 10/31/2021 11:44 PM  Performed by: Ezequiel Essex, MD Authorized by: Ezequiel Essex, MD   Procedure details:    Indications: poor IV access     Skin  Prep: chlorhexidine gluconate     Location: R upper arm.   Angiocath:  20 G   Bedside Ultrasound Guided: Yes     Images: archived     Patient tolerated procedure without complications: Yes     Dressing applied: Yes       Medications Ordered in ED Medications  furosemide (LASIX) injection 40 mg (has no administration in time range)  aspirin chewable tablet 324 mg (has no administration in time range)  methylPREDNISolone sodium succinate (SOLU-MEDROL) 125 mg/2 mL injection 125 mg (has no administration in time range)  ipratropium-albuterol (DUONEB) 0.5-2.5 (3) MG/3ML nebulizer solution 3 mL (3 mLs Nebulization Given 10/31/21 2237)    ED Course/ Medical Decision Making/ A&P                           Medical Decision Making Amount and/or Complexity of Data Reviewed Labs: ordered. Decision-making details documented in ED Course. Radiology: ordered and independent interpretation performed. Decision-making details documented in ED Course. ECG/medicine tests: ordered and independent interpretation performed. Decision-making details documented in ED Course.  Risk OTC drugs. Prescription drug management.   Respiratory distress with history of CHF, obesity and asthma.  Hypoxic and tachypneic on arrival.  Placed on oxygen, given bronchodilators,  steroids and Lasix.  Patient has rhonchi on arrival as well as some wheezing.  She is given bronchodilators and steroids.  Her chest x-ray is consistent with volume overload and pulmonary edema.  Results reviewed and interpreted by me.  Patient given IV Lasix.  Delay as IV access was difficult. \Labs are pending but anticipate she will need admission again for hypoxic respiratory failure likely secondary to volume overload and new oxygen requirement.  Care to be transferred to Dr. Florina Ou at shift change        Final Clinical Impression(s) / ED Diagnoses Final diagnoses:  Acute on chronic respiratory failure with hypoxia ([place])  Acute on chronic diastolic congestive heart failure [facility])    Rx / DC Orders ED Discharge Orders     None         Ezequiel Essex, MD 10/31/21 2348

## 2021-10-31 NOTE — ED Triage Notes (Signed)
[facility] with new dx of CHF 4 days ago States new cough x 2 days with worsening SOB  Pt has h/o of anxiety as well    Respiratory at bedside, spo2 83% on RA

## 2021-11-01 ENCOUNTER — Inpatient Hospital Stay (HOSPITAL_COMMUNITY)
Admission: EM | Admit: 2021-11-01 | Discharge: 2021-11-06 | DRG: 202 | Disposition: A | Discharge status: Home / Self Care | Payer: No Typology Code available for payment source | Attending: Internal Medicine | Admitting: Internal Medicine

## 2021-11-01 ENCOUNTER — Emergency Department (HOSPITAL_COMMUNITY): Payer: No Typology Code available for payment source

## 2021-11-01 ENCOUNTER — Encounter (HOSPITAL_COMMUNITY): Payer: Self-pay

## 2021-11-01 DIAGNOSIS — D509 Iron deficiency anemia, unspecified: Secondary | ICD-10-CM | POA: Diagnosis present

## 2021-11-01 DIAGNOSIS — J441 Chronic obstructive pulmonary disease with (acute) exacerbation: Secondary | ICD-10-CM

## 2021-11-01 DIAGNOSIS — G4733 Obstructive sleep apnea (adult) (pediatric): Secondary | ICD-10-CM | POA: Diagnosis present

## 2021-11-01 DIAGNOSIS — J9601 Acute respiratory failure with hypoxia: Secondary | ICD-10-CM

## 2021-11-01 DIAGNOSIS — B9789 Other viral agents as the cause of diseases classified elsewhere: Secondary | ICD-10-CM | POA: Diagnosis present

## 2021-11-01 DIAGNOSIS — D696 Thrombocytopenia, unspecified: Secondary | ICD-10-CM | POA: Diagnosis present

## 2021-11-01 DIAGNOSIS — Z6841 Body Mass Index (BMI) 40.0 and over, adult: Secondary | ICD-10-CM

## 2021-11-01 DIAGNOSIS — R0902 Hypoxemia: Principal | ICD-10-CM

## 2021-11-01 DIAGNOSIS — R739 Hyperglycemia, unspecified: Secondary | ICD-10-CM

## 2021-11-01 DIAGNOSIS — I110 Hypertensive heart disease with heart failure: Secondary | ICD-10-CM | POA: Diagnosis present

## 2021-11-01 DIAGNOSIS — E1165 Type 2 diabetes mellitus with hyperglycemia: Secondary | ICD-10-CM | POA: Diagnosis present

## 2021-11-01 DIAGNOSIS — F17210 Nicotine dependence, cigarettes, uncomplicated: Secondary | ICD-10-CM | POA: Diagnosis present

## 2021-11-01 DIAGNOSIS — I5033 Acute on chronic diastolic (congestive) heart failure: Secondary | ICD-10-CM | POA: Diagnosis present

## 2021-11-01 DIAGNOSIS — E662 Morbid (severe) obesity with alveolar hypoventilation: Secondary | ICD-10-CM | POA: Diagnosis present

## 2021-11-01 DIAGNOSIS — Z5320 Procedure and treatment not carried out because of patient's decision for unspecified reasons: Secondary | ICD-10-CM | POA: Diagnosis present

## 2021-11-01 DIAGNOSIS — Z20822 Contact with and (suspected) exposure to covid-19: Secondary | ICD-10-CM | POA: Diagnosis present

## 2021-11-01 DIAGNOSIS — J4541 Moderate persistent asthma with (acute) exacerbation: Principal | ICD-10-CM | POA: Diagnosis present

## 2021-11-01 DIAGNOSIS — I5032 Chronic diastolic (congestive) heart failure: Secondary | ICD-10-CM | POA: Diagnosis present

## 2021-11-01 DIAGNOSIS — J45901 Unspecified asthma with (acute) exacerbation: Secondary | ICD-10-CM | POA: Diagnosis present

## 2021-11-01 DIAGNOSIS — J9621 Acute and chronic respiratory failure with hypoxia: Secondary | ICD-10-CM | POA: Diagnosis present

## 2021-11-01 DIAGNOSIS — Z5902 Unsheltered homelessness: Secondary | ICD-10-CM

## 2021-11-01 DIAGNOSIS — Z79899 Other long term (current) drug therapy: Secondary | ICD-10-CM

## 2021-11-01 LAB — I-STAT CHEM 8, ED
BUN: 18 mg/dL (ref 6–20)
Calcium, Ion: 1.06 mmol/L — ABNORMAL LOW (ref 1.15–1.40)
Chloride: 100 mmol/L (ref 98–111)
Creatinine, Ser: 0.8 mg/dL (ref 0.44–1.00)
Glucose, Bld: 121 mg/dL — ABNORMAL HIGH (ref 70–99)
HCT: 44 % (ref 36.0–46.0)
Hemoglobin: 15 g/dL (ref 12.0–15.0)
Potassium: 4.2 mmol/L (ref 3.5–5.1)
Sodium: 141 mmol/L (ref 135–145)
TCO2: 32 mmol/L (ref 22–32)

## 2021-11-01 LAB — BASIC METABOLIC PANEL
Anion gap: 10 (ref 5–15)
BUN: 16 mg/dL (ref 6–20)
CO2: 28 mmol/L (ref 22–32)
Calcium: 8.7 mg/dL — ABNORMAL LOW (ref 8.9–10.3)
Chloride: 102 mmol/L (ref 98–111)
Creatinine, Ser: 0.9 mg/dL (ref 0.44–1.00)
GFR, Estimated: >60 mL/min (ref >60)
Glucose, Bld: 122 mg/dL — ABNORMAL HIGH (ref 70–99)
Potassium: 4.2 mmol/L (ref 3.5–5.1)
Sodium: 140 mmol/L (ref 135–145)

## 2021-11-01 LAB — TROPONIN I (HIGH SENSITIVITY): Troponin I (High Sensitivity): 7 ng/L (ref <18)

## 2021-11-01 LAB — BRAIN NATRIURETIC PEPTIDE: B Natriuretic Peptide: 43.7 pg/mL (ref 0.0–100.0)

## 2021-11-01 MED ORDER — IPRATROPIUM-ALBUTEROL 0.5-2.5 (3) MG/3ML IN SOLN: 3.0000 mL | Freq: Once | RESPIRATORY_TRACT | Status: DC

## 2021-11-01 MED ORDER — IPRATROPIUM BROMIDE 0.02 % IN SOLN
1.0000 mg | Freq: Once | RESPIRATORY_TRACT | Status: AC
  Administered 2021-11-02: 1 mg via RESPIRATORY_TRACT
  Filled 2021-11-01: qty 5

## 2021-11-01 MED ORDER — IPRATROPIUM-ALBUTEROL 0.5-2.5 (3) MG/3ML IN SOLN
3.0000 mL | RESPIRATORY_TRACT | Status: DC | PRN
  Administered 2021-11-01: 3 mL via RESPIRATORY_TRACT
  Filled 2021-11-01: qty 3

## 2021-11-01 MED ORDER — METHYLPREDNISOLONE SODIUM SUCC 125 MG IJ SOLR
125.0000 mg | Freq: Once | INTRAMUSCULAR | Status: AC
  Administered 2021-11-01: 125 mg via INTRAVENOUS
  Filled 2021-11-01: qty 2

## 2021-11-01 MED ORDER — ALBUTEROL SULFATE (2.5 MG/3ML) 0.083% IN NEBU
15.0000 mg | INHALATION_SOLUTION | Freq: Once | RESPIRATORY_TRACT | Status: AC
  Administered 2021-11-02: 15 mg via RESPIRATORY_TRACT
  Filled 2021-11-01: qty 18

## 2021-11-01 NOTE — ED Provider Notes (Addendum)
Patient is admitted and awaiting transport.  She has been stable overnight.  Patient reports she does not want to wear the nonrebreather mask.  She reports it makes it harder for her to breathe.  She is requesting transition to nasal cannula.  Patient reports she thinks her breathing got worse because her apartment does not have any air conditioning and she is on the second floor.  She reports she feels like it was so hot and there she had a panic attack and could not breathe. Physical Exam  BP (!) 145/107   Pulse 74   Temp 98.2 F (36.8 C) (Oral)   Resp 15   Ht '5\' 3"'$  (1.6 m)   Wt (!) 213.7 kg   LMP  (LMP Unknown)   SpO2 95%   BMI 83.46 kg/m   Physical Exam Constitutional:      Comments: Patient is alert sitting upright in the stretcher.  She is not exhibit any confusion.  She has removed her nonrebreather mask but is breathing with only mild increased work of breathing.  Speaking in full sentences.  Oxygen saturation on room air appears to be 84% with good waveform.  HENT:     Mouth/Throat:     Pharynx: Oropharynx is clear.  Cardiovascular:     Rate and Rhythm: Normal rate and regular rhythm.  Pulmonary:     Comments: Mild increased work of breathing at rest.  Breath sounds soft at bases.  No gross crackle or coarse wheeze Musculoskeletal:     Comments: Patient does have significantly obese extremities but she does not seem to have edema of the lower extremities.  The tissues of the lower extremities are soft and pliable.  There is no erythema.  The feet are normal without edema  Neurological:     Comments: Patient is alert.  She shows no signs of confusion.  Normal use of all 4 extremities      Procedures  Procedures  ED Course / MDM    Medical Decision Making Amount and/or Complexity of Data Reviewed Labs: ordered. Radiology: ordered. ECG/medicine tests: ordered.  Risk OTC drugs. Prescription drug management. Decision regarding hospitalization.   Patient is  awaiting admission.  She does not want to wear the nonrebreather mask.  On room air her oxygen saturation appears to be about 84%.  Will transition to nasal cannula as her request.  Is placed on 6 L nasal cannula and is at about 91-93%.  She is tolerating this well.  She does not appear dyspneic or in respiratory distress.  Sounds are very soft.  Clinically patient does not appear significantly volume overloaded.  Will administer a DuoNeb and continue to observe pending her transfer for admission.  08: 24 patient is having social concerns for her husband getting to work at [employer].  She reports this is the only income they have for the family.  We have been trying to assist in getting her husband to work and there are other complications with care for her children.  Patient is very stressed.  Staff is actively trying to find support for care for the patient's children at home and assisting in transportation.  I am encouraging the patient to stay for treatment as she is clearly hypoxic off of oxygen and has high risk for deterioration.  Patient does have clear mental status.  At this time, an 38 year old sister is coming to the emergency department to pick up the children and staff member has volunteered to take the husband to  work at [employer]  12: 81 patient again wants to leave AMA.  Have made multiple attempts to support with helping with her children transportation and has been transportation.  Patient advised that she must get back to her apartment and has many things that she has to do.  Patient was ambulated and desaturated to 65% with ambulating.  Patient's mental status remains clear.  She is not objectively dyspneic when she is seated at rest and talking.  At this time I have extensively reviewed the risks of severe disability or death due to hypoxia if she worsens and there is delay in assistance or increased difficulty and support due to critical condition.  Patient's children, 16 and 38  are at bedside.  Patient is citing lack of familial support and necessity for her to be out of the hospital to take care of their living arrangements.      Charlesetta Shanks, MD 11/01/21 9147    Charlesetta Shanks, MD 11/01/21 8295    Charlesetta Shanks, MD 11/01/21 1257

## 2021-11-01 NOTE — ED Notes (Signed)
Patient denies any pain at this time . Alert x4  Breathing non labored

## 2021-11-01 NOTE — ED Notes (Signed)
Patient stating she needs to leave to drive her husband to work as her family needs money. Patient informed that if she leaves it would be AMA as she needs to be admitted due to her condition. States she does not have oxygen at home and would be staying in care since her new apartment doesn't have furniture or [place]. Has 2 daughters with her that cannot ride with [facility]. Assisting patient find a ride for husband to work. 3rd daughter will pick up other daughters when patient has a room assigned. Will ask for social work to get involved.

## 2021-11-01 NOTE — ED Provider Notes (Signed)
Renee Paul   CSN: [id number] Arrival date & time: 11/01/21  2140     History  Chief Complaint  Patient presents with   Shortness of Breath    Renee Paul is a 36 y.o. female PMH CHF (on 1 L intermittently with exertion), OSA, asthma, obesity who is presenting with shortness of breath.  Patient reports that she was recently hospitalized about a week ago with similar symptoms.  She was discharged a few days ago, and was feeling okay.  She was staying in a hotel and "resting up".  However, yesterday, she attempted to move into a new apartment.  There was no air conditioning there, and she had to exert herself by climbing some stairs.  She believes that this triggered her initial shortness of breath.  She says that she has shortness of breath often, but that it typically resolves with rest.  However, this episode did not resolve, prompting her to initially seek care at an outside ED.  She denies any fevers, chills.  She notes cough that has become productive in the last 1 to 2 weeks of a "green" colored phlegm.  She also notes nasal congestion.  She denies any chest pain, nausea, vomiting, abdominal pain.  Denies any peripheral extremity edema.     Home Medications Prior to Admission medications   Medication Sig Start Date End Date Taking? Authorizing Provider  albuterol (PROVENTIL HFA;VENTOLIN HFA) 108 (90 Base) MCG/ACT inhaler Inhale 2 puffs into the lungs every 4 (four) hours as needed for wheezing or shortness of breath. 09/06/16  Yes Pollina, Gwenyth Allegra, MD  ferrous sulfate 325 (65 FE) MG tablet Take 1 tablet (325 mg total) by mouth daily with breakfast. 06/10/21  Yes Patrecia Pour, MD  furosemide (LASIX) 40 MG tablet Take 1 tablet (40 mg total) by mouth daily. 10/26/21  Yes Domenic Polite, MD  ibuprofen (ADVIL) 600 MG tablet Take 600 mg by mouth every 6 (six) hours as needed for mild pain.   Yes [provider]   megestrol (MEGACE) 40 MG tablet Take 1 tablet (40 mg total) by mouth 2 (two) times daily. 06/10/21  Yes Patrecia Pour, MD  nicotine (NICODERM CQ - DOSED IN MG/24 HOURS) 14 mg/24hr patch Place 1 patch (14 mg total) onto the skin daily. Patient not taking: Reported on 11/01/2021 10/27/21   Domenic Polite, MD  spironolactone (ALDACTONE) 25 MG tablet Take 1 tablet (25 mg total) by mouth daily. Patient not taking: Reported on 11/01/2021 10/27/21   Domenic Polite, MD      Allergies    Patient has no known allergies.    Review of Systems   Review of Systems  Respiratory:  Positive for shortness of breath.     Physical Exam Updated Vital Signs BP (!) 130/92   Pulse 86   Temp 97.8 F (36.6 C) (Oral)   Resp 20   LMP  (LMP Unknown)   SpO2 93%  Physical Exam Vitals and nursing Paul reviewed.  Constitutional:      General: She is not in acute distress.    Appearance: Normal appearance. She is obese. She is not ill-appearing or diaphoretic.     Comments: Obese female laying in bed with nasal cannula in place.  HENT:     Head: Normocephalic and atraumatic.     Right Ear: External ear normal.     Left Ear: External ear normal.     Nose: Nose normal.  Mouth/Throat:     Mouth: Mucous membranes are moist.  Cardiovascular:     Rate and Rhythm: Normal rate and regular rhythm.     Heart sounds: No murmur heard. Pulmonary:     Effort: Pulmonary effort is normal. No tachypnea or respiratory distress.     Breath sounds: Wheezing present.     Comments: Diffuse expiratory wheezing heard in all lung fields. Abdominal:     General: There is no distension.     Palpations: Abdomen is soft.     Tenderness: There is no abdominal tenderness. There is no guarding.  Musculoskeletal:     Cervical back: Neck supple.     Right lower leg: No edema.     Left lower leg: No edema.     Comments: No evidence of pitting edema to bilateral lower extremities.  Skin:    General: Skin is warm and dry.   Neurological:     Mental Status: She is alert.     ED Results / Procedures / Treatments   Labs (all labs ordered are listed, but only abnormal results are displayed) Labs Reviewed  BASIC METABOLIC PANEL  I-STAT CHEM 8, ED    EKG EKG Interpretation  Date/Time:  Thursday November 01 2021 21:43:20 EDT Ventricular Rate:  82 PR Interval:  145 QRS Duration: 83 QT Interval:  400 QTC Calculation: 468 R Axis:   28 Text Interpretation: Sinus rhythm No significant change since last tracing Confirmed by Wandra Arthurs [phone]) on 11/01/2021 10:28:27 PM  Radiology DG Chest Portable 1 View  Result Date: 10/31/2021 CLINICAL DATA:  CHF, cough for 2 days, worsening shortness of breath EXAM: PORTABLE CHEST 1 VIEW COMPARISON:  10/23/2021 FINDINGS: Single frontal view of the chest was obtained, limited by portable AP technique and patient body habitus. The cardiac silhouette is enlarged. Central vascular congestion without evidence of airspace disease, effusion, or pneumothorax. No acute bony abnormality. IMPRESSION: 1. Limited study due to portable technique and body habitus. 2. Persistent cardiomegaly and central vascular congestion. No overt edema. Electronically Signed   By: Renee Paul M.D.   On: 10/31/2021 22:53    Procedures Procedures   Medications Ordered in ED Medications  methylPREDNISolone sodium succinate (SOLU-MEDROL) 125 mg/2 mL injection 125 mg (has no administration in time range)  albuterol (PROVENTIL,VENTOLIN) solution continuous neb (has no administration in time range)  ipratropium (ATROVENT) nebulizer solution 1 mg (has no administration in time range)    ED Course/ Medical Decision Making/ A&P                           Medical Decision Making Amount and/or Complexity of Data Reviewed Labs: ordered. Radiology: ordered.  Risk Prescription drug management.   Renee Paul is a 36 y.o. female with significant PMHx asthma, daily tobacco use (~20 pack year history),  CHF who presented to the ED with shortness of breath.  Vitals at presentation within normal limits.  Patient is hemodynamically stable, afebrile.  Patient hypoxic on room air, requiring 3 L nasal cannula.  Physical exam notable for diffuse expiratory wheezing in all lung fields.  Normal heart sounds.  Abdomen is soft, nontender to palpation.  No pitting edema of lower extremities.  Initial differential includes but is not limited to: CHF, asthma, COPD exacerbation, pneumonia, bronchitis, URI  Patient was initially admitted at outside facility.  I was able to review the records and results of blood work as well as imaging obtained at this outside  facility.  CBC without leukocytosis or anemia.  CMP with electrolytes within normal limits.  No AKI.  No elevated LFTs or anion gap.  Chest x-ray obtained at outside facility, resulted notable for "persistent cardiomegaly and central vascular congestion".  Per ED provider Paul, providers there believed the patient's presentation was due to CHF versus COPD exacerbation.  Patient received bronchodilators and steroids.  Patient also received IV diuresis.  Lab work repeated here including BMP, resulted notable for electrolytes within normal limits.  Glucose mild elevated at 122.  No AKI.  No anion gap.  Imaging was pursued, notable for cardiomegaly with vascular congestion.  Patient was provided DuoNebs and steroids for presumed COPD exacerbation.  We will admit the patient to medicine for additional evaluation and treatment of her shortness of breath with new O2 requirement--asthma exacerbation vs COPD exacerbation.  The plan for this patient was discussed with Dr. Darl Householder, who voiced agreement and who oversaw evaluation and treatment of this patient.    Final Clinical Impression(s) / ED Diagnoses Final diagnoses:  Hypoxia  COPD exacerbation [facility])    Rx / DC Orders ED Discharge Orders     None         Faylene Million, MD 11/01/21 2332    Drenda Freeze, MD 11/02/21 1620

## 2021-11-01 NOTE — ED Notes (Signed)
ED Provider at bedside. 

## 2021-11-01 NOTE — ED Provider Notes (Signed)
Nursing notes and vitals signs, including pulse oximetry, reviewed.  Summary of this visit's results, reviewed by myself:  EKG:  EKG Interpretation  Date/Time:    Ventricular Rate:    PR Interval:    QRS Duration:   QT Interval:    QTC Calculation:   R Axis:     Text Interpretation:          Labs:  Results for orders placed or performed during the hospital encounter of 10/31/21 (from the past 24 hour(s))  CBC with Differential     Status: Abnormal   Collection Time: 10/31/21 11:28 PM  Result Value Ref Range   WBC 8.9 4.0 - 10.5 K/uL   RBC 4.83 3.87 - 5.11 MIL/uL   Hemoglobin 13.2 12.0 - 15.0 g/dL   HCT 44.7 36.0 - 46.0 %   MCV 92.5 80.0 - 100.0 fL   MCH 27.3 26.0 - 34.0 pg   MCHC 29.5 (L) 30.0 - 36.0 g/dL   RDW 17.3 (H) 11.5 - 15.5 %   Platelets 50 (L) 150 - 400 K/uL   nRBC 0.0 0.0 - 0.2 %   Neutrophils Relative % 76 %   Neutro Abs 6.8 1.7 - 7.7 K/uL   Lymphocytes Relative 17 %   Lymphs Abs 1.5 0.7 - 4.0 K/uL   Monocytes Relative 4 %   Monocytes Absolute 0.3 0.1 - 1.0 K/uL   Eosinophils Relative 3 %   Eosinophils Absolute 0.2 0.0 - 0.5 K/uL   Basophils Relative 0 %   Basophils Absolute 0.0 0.0 - 0.1 K/uL   Immature Granulocytes 0 %   Abs Immature Granulocytes 0.03 0.00 - 0.07 K/uL  Comprehensive metabolic panel     Status: Abnormal   Collection Time: 10/31/21 11:28 PM  Result Value Ref Range   Sodium 139 135 - 145 mmol/L   Potassium 3.9 3.5 - 5.1 mmol/L   Chloride 100 98 - 111 mmol/L   CO2 30 22 - 32 mmol/L   Glucose, Bld 95 70 - 99 mg/dL   BUN 10 6 - 20 mg/dL   Creatinine, Ser 0.77 0.44 - 1.00 mg/dL   Calcium 8.4 (L) 8.9 - 10.3 mg/dL   Total Protein 8.2 (H) 6.5 - 8.1 g/dL   Albumin 3.5 3.5 - 5.0 g/dL   AST 15 15 - 41 U/L   ALT 18 0 - 44 U/L   Alkaline Phosphatase 49 38 - 126 U/L   Total Bilirubin 0.9 0.3 - 1.2 mg/dL   GFR, Estimated >60 >60 mL/min   Anion gap 9 5 - 15  Troponin I (High Sensitivity)     Status: None   Collection Time: 10/31/21 11:42  PM  Result Value Ref Range   Troponin I (High Sensitivity) 7 <18 ng/L  Brain natriuretic peptide     Status: None   Collection Time: 10/31/21 11:42 PM  Result Value Ref Range   B Natriuretic Peptide 43.7 0.0 - 100.0 pg/mL    Imaging Studies: DG Chest Portable 1 View  Result Date: 10/31/2021 CLINICAL DATA:  CHF, cough for 2 days, worsening shortness of breath EXAM: PORTABLE CHEST 1 VIEW COMPARISON:  10/23/2021 FINDINGS: Single frontal view of the chest was obtained, limited by portable AP technique and patient body habitus. The cardiac silhouette is enlarged. Central vascular congestion without evidence of airspace disease, effusion, or pneumothorax. No acute bony abnormality. IMPRESSION: 1. Limited study due to portable technique and body habitus. 2. Persistent cardiomegaly and central vascular congestion. No overt edema. Electronically Signed  By: Randa Ngo M.D.   On: 10/31/2021 22:53    Dr. Velia Meyer to admit to hospitalist service.     Danyel Tobey, Jenny Reichmann, MD 11/01/21 [phone]

## 2021-11-01 NOTE — ED Notes (Signed)
Pt states she wants to leave, has things she needs to deal with at home.  Provider at bedside.  Pt instructed on risks of leaving AMA and encouraged to stay.  Pt still refused.  IV removed and AMA signed.  Pt instructed to call 911 or return to ER is symptoms persist or she has any difficulty breathing or Chest pain. Pt verbalized understanding

## 2021-11-01 NOTE — ED Notes (Signed)
Ambulated patient in the hallway with no oxygen on. Patient oxygen level went down to  67 %. Offer patient wheel chair to go back to room she declined. Patient is currently sitting on the side of the bed stating that she wants to be discharge . Denied felling shortness of breath even though oxygen was at 67 % while walking. Put patient back on 6 liter after the  walk and her oxygen went back up to 91 %

## 2021-11-01 NOTE — Discharge Instructions (Signed)
1.  You are leaving [place].  There is high risk of you getting worse and possibly dying or having permanent disability. 2.  Continue all the medications prescribed at your discharge.  Start them and continue them as of today.  In the emergency department you were given a dose of Lasix and breathing treatments.  You required continuous oxygen.  3.  You must see your doctor for recheck as soon as possible.  Return to the emergency department immediately if you feel you are getting worse and are willing to return for ongoing treatment.

## 2021-11-01 NOTE — ED Triage Notes (Signed)
Patient arrives via EMS from home due to SOB. Patient with hx of asthma and recently diagnosed with rhinovirus. When EMS arrived patient was 86% on room air, patient placed on 3 liters of oxygen via nasal cannula with spo2 then at 96%. BP 136/82, HR 80 CBG 154.

## 2021-11-01 NOTE — ED Notes (Signed)
Called Case Management at [facility] to inform them of patient situation. States she will have a Education officer, museum assigned to her when she is admitted. Informed patient to ask for social work when she gets to [facility] so they can provide her with the resources she needs

## 2021-11-01 NOTE — ED Notes (Signed)
Pt asleep in bed, arousable to verbal stimuli. On partial rebreather while asleep. Purewick in place. Respirations even and unlabored.

## 2021-11-01 NOTE — ED Notes (Signed)
Patient stating she wants to leave AMA, Dr. Colvin Caroli notified to speak with patient, informs patient that she cant leave, attempting to speak to [facility] to see if they can transport kids with patient

## 2021-11-01 NOTE — Progress Notes (Signed)
Transferring facility: [facility] Requesting provider: Dr. Florina Ou (EDP at [facility]) Reason for transfer: admission for further evaluation and management of acute on chronic diastolic heart failure.    36 year old female with medical history notable for chronic diastolic heart failure, who presented to [facility] ED on 10/31/21 complaining of shortness of breath over the last few days.  No chest pain. She reportedly has a history of chronic diastolic heart failure and was recently admitted to [facility] for acute on chronic diastolic heart failure.   Vital signs in the ED were notable for the following: Initial oxygen saturation 83% on room air, improving into the range of 94 to 98% on 3 L nasal cannula, although baseline supplemental oxygen requirements are not entirely clear.  Systolic blood pressures in the 130s mmHg.  Afebrile.    Labs were notable for nonelevated troponin, while EKG reportedly showed sinus rhythm without evidence of acute ischemic changes.  Imaging notable for chest x-ray with interval worsening of pulmonary vascular congestion.   Medications administered prior to transfer included the following: Lasix 40 mg IV x 1 dose.    Subsequently, I accepted this patient for transfer for inpatient admission to a cardiac telemetry bed at [facility] for further work-up and management of acute on chronic diastolic heart failure, including need for IV diuresis, with potential element of acute hypoxic respiratory distress.       Check [email or web address] for on-call coverage.   Nursing staff, Please call [place] number on Amion as soon as patient's arrival, so appropriate admitting provider can evaluate the pt.     Babs Bertin, DO Hospitalist

## 2021-11-02 ENCOUNTER — Encounter (HOSPITAL_COMMUNITY): Payer: Self-pay | Admitting: Internal Medicine

## 2021-11-02 DIAGNOSIS — D696 Thrombocytopenia, unspecified: Secondary | ICD-10-CM | POA: Diagnosis present

## 2021-11-02 DIAGNOSIS — J4541 Moderate persistent asthma with (acute) exacerbation: Secondary | ICD-10-CM

## 2021-11-02 DIAGNOSIS — E662 Morbid (severe) obesity with alveolar hypoventilation: Secondary | ICD-10-CM | POA: Diagnosis present

## 2021-11-02 DIAGNOSIS — Z79899 Other long term (current) drug therapy: Secondary | ICD-10-CM | POA: Diagnosis not present

## 2021-11-02 DIAGNOSIS — E1165 Type 2 diabetes mellitus with hyperglycemia: Secondary | ICD-10-CM | POA: Diagnosis present

## 2021-11-02 DIAGNOSIS — I5032 Chronic diastolic (congestive) heart failure: Secondary | ICD-10-CM | POA: Diagnosis not present

## 2021-11-02 DIAGNOSIS — J45901 Unspecified asthma with (acute) exacerbation: Secondary | ICD-10-CM | POA: Diagnosis not present

## 2021-11-02 DIAGNOSIS — J9601 Acute respiratory failure with hypoxia: Secondary | ICD-10-CM | POA: Diagnosis not present

## 2021-11-02 DIAGNOSIS — D509 Iron deficiency anemia, unspecified: Secondary | ICD-10-CM | POA: Diagnosis present

## 2021-11-02 DIAGNOSIS — Z5320 Procedure and treatment not carried out because of patient's decision for unspecified reasons: Secondary | ICD-10-CM | POA: Diagnosis present

## 2021-11-02 DIAGNOSIS — I110 Hypertensive heart disease with heart failure: Secondary | ICD-10-CM | POA: Diagnosis present

## 2021-11-02 DIAGNOSIS — B9789 Other viral agents as the cause of diseases classified elsewhere: Secondary | ICD-10-CM | POA: Diagnosis present

## 2021-11-02 DIAGNOSIS — Z20822 Contact with and (suspected) exposure to covid-19: Secondary | ICD-10-CM | POA: Diagnosis present

## 2021-11-02 DIAGNOSIS — J9621 Acute and chronic respiratory failure with hypoxia: Secondary | ICD-10-CM | POA: Diagnosis present

## 2021-11-02 DIAGNOSIS — R0902 Hypoxemia: Secondary | ICD-10-CM | POA: Diagnosis present

## 2021-11-02 DIAGNOSIS — I5033 Acute on chronic diastolic (congestive) heart failure: Secondary | ICD-10-CM | POA: Diagnosis present

## 2021-11-02 DIAGNOSIS — Z6841 Body Mass Index (BMI) 40.0 and over, adult: Secondary | ICD-10-CM | POA: Diagnosis not present

## 2021-11-02 DIAGNOSIS — E669 Obesity, unspecified: Secondary | ICD-10-CM | POA: Diagnosis not present

## 2021-11-02 DIAGNOSIS — F17210 Nicotine dependence, cigarettes, uncomplicated: Secondary | ICD-10-CM | POA: Diagnosis present

## 2021-11-02 DIAGNOSIS — Z5902 Unsheltered homelessness: Secondary | ICD-10-CM | POA: Diagnosis not present

## 2021-11-02 LAB — TECHNOLOGIST SMEAR REVIEW

## 2021-11-02 LAB — BASIC METABOLIC PANEL
Anion gap: 10 (ref 5–15)
BUN: 16 mg/dL (ref 6–20)
CO2: 29 mmol/L (ref 22–32)
Calcium: 8.6 mg/dL — ABNORMAL LOW (ref 8.9–10.3)
Chloride: 98 mmol/L (ref 98–111)
Creatinine, Ser: 1 mg/dL (ref 0.44–1.00)
GFR, Estimated: >60 mL/min (ref >60)
Glucose, Bld: 312 mg/dL — ABNORMAL HIGH (ref 70–99)
Potassium: 4.1 mmol/L (ref 3.5–5.1)
Sodium: 137 mmol/L (ref 135–145)

## 2021-11-02 LAB — CBC
HCT: 44.4 % (ref 36.0–46.0)
Hemoglobin: 13.1 g/dL (ref 12.0–15.0)
MCH: 27.7 pg (ref 26.0–34.0)
MCHC: 29.5 g/dL — ABNORMAL LOW (ref 30.0–36.0)
MCV: 93.9 fL (ref 80.0–100.0)
Platelets: 326 10*3/uL (ref 150–400)
RBC: 4.73 MIL/uL (ref 3.87–5.11)
RDW: 17.4 % — ABNORMAL HIGH (ref 11.5–15.5)
WBC: 10.3 10*3/uL (ref 4.0–10.5)
nRBC: 0 % (ref 0.0–0.2)

## 2021-11-02 LAB — DIFFERENTIAL
Abs Immature Granulocytes: 0.05 10*3/uL (ref 0.00–0.07)
Basophils Absolute: 0 10*3/uL (ref 0.0–0.1)
Basophils Relative: 0 %
Eosinophils Absolute: 0 10*3/uL (ref 0.0–0.5)
Eosinophils Relative: 0 %
Immature Granulocytes: 1 %
Lymphocytes Relative: 3 %
Lymphs Abs: 0.3 10*3/uL — ABNORMAL LOW (ref 0.7–4.0)
Monocytes Absolute: 0.1 10*3/uL (ref 0.1–1.0)
Monocytes Relative: 1 %
Neutro Abs: 9.8 10*3/uL — ABNORMAL HIGH (ref 1.7–7.7)
Neutrophils Relative %: 95 %

## 2021-11-02 LAB — HEPATIC FUNCTION PANEL
ALT: 20 U/L (ref 0–44)
AST: 17 U/L (ref 15–41)
Albumin: 3.3 g/dL — ABNORMAL LOW (ref 3.5–5.0)
Alkaline Phosphatase: 49 U/L (ref 38–126)
Bilirubin, Direct: 0.2 mg/dL (ref 0.0–0.2)
Indirect Bilirubin: 0.6 mg/dL (ref 0.3–0.9)
Total Bilirubin: 0.8 mg/dL (ref 0.3–1.2)
Total Protein: 8.4 g/dL — ABNORMAL HIGH (ref 6.5–8.1)

## 2021-11-02 LAB — GLUCOSE, CAPILLARY: Glucose-Capillary: 370 mg/dL — ABNORMAL HIGH (ref 70–99)

## 2021-11-02 LAB — SARS CORONAVIRUS 2 BY RT PCR: SARS Coronavirus 2 by RT PCR: NEGATIVE

## 2021-11-02 LAB — MAGNESIUM: Magnesium: 2.1 mg/dL (ref 1.7–2.4)

## 2021-11-02 MED ORDER — FUROSEMIDE 20 MG PO TABS
40.0000 mg | ORAL_TABLET | Freq: Every day | ORAL | Status: DC
Start: 2021-11-02 — End: 2021-11-02

## 2021-11-02 MED ORDER — ACETAMINOPHEN 325 MG PO TABS
650.0000 mg | ORAL_TABLET | Freq: Four times a day (QID) | ORAL | Status: DC | PRN
  Administered 2021-11-02 (×2): 650 mg via ORAL
  Filled 2021-11-02 (×2): qty 2

## 2021-11-02 MED ORDER — INSULIN ASPART 100 UNIT/ML IJ SOLN: 0.0000 [IU] | Freq: Three times a day (TID) | INTRAMUSCULAR | Status: DC

## 2021-11-02 MED ORDER — FUROSEMIDE 10 MG/ML IJ SOLN
20.0000 mg | Freq: Once | INTRAMUSCULAR | Status: AC
  Administered 2021-11-02: 20 mg via INTRAVENOUS
  Filled 2021-11-02: qty 2

## 2021-11-02 MED ORDER — FERROUS SULFATE 325 (65 FE) MG PO TABS
325.0000 mg | ORAL_TABLET | Freq: Every day | ORAL | Status: DC
  Administered 2021-11-02 – 2021-11-06 (×5): 325 mg via ORAL
  Filled 2021-11-02 (×5): qty 1

## 2021-11-02 MED ORDER — ALBUTEROL SULFATE (2.5 MG/3ML) 0.083% IN NEBU
2.5000 mg | INHALATION_SOLUTION | RESPIRATORY_TRACT | Status: DC | PRN
  Administered 2021-11-03: 2.5 mg via RESPIRATORY_TRACT
  Filled 2021-11-02: qty 3

## 2021-11-02 MED ORDER — INSULIN ASPART 100 UNIT/ML IJ SOLN: 0.0000 [IU] | Freq: Every day | INTRAMUSCULAR | Status: DC

## 2021-11-02 MED ORDER — MEGESTROL ACETATE 40 MG PO TABS
40.0000 mg | ORAL_TABLET | Freq: Two times a day (BID) | ORAL | Status: DC
  Administered 2021-11-02 – 2021-11-06 (×9): 40 mg via ORAL
  Filled 2021-11-02 (×11): qty 1

## 2021-11-02 MED ORDER — BUDESONIDE 0.25 MG/2ML IN SUSP
0.2500 mg | Freq: Two times a day (BID) | RESPIRATORY_TRACT | Status: DC
  Administered 2021-11-02 – 2021-11-06 (×8): 0.25 mg via RESPIRATORY_TRACT
  Filled 2021-11-02 (×10): qty 2

## 2021-11-02 MED ORDER — ALBUTEROL SULFATE HFA 108 (90 BASE) MCG/ACT IN AERS: 2.0000 | INHALATION_SPRAY | RESPIRATORY_TRACT | Status: DC | PRN

## 2021-11-02 MED ORDER — METHYLPREDNISOLONE SODIUM SUCC 40 MG IJ SOLR
40.0000 mg | Freq: Two times a day (BID) | INTRAMUSCULAR | Status: DC
  Administered 2021-11-02 – 2021-11-04 (×5): 40 mg via INTRAVENOUS
  Filled 2021-11-02 (×5): qty 1

## 2021-11-02 MED ORDER — ALBUTEROL SULFATE (2.5 MG/3ML) 0.083% IN NEBU
2.5000 mg | INHALATION_SOLUTION | RESPIRATORY_TRACT | Status: DC
  Administered 2021-11-02 – 2021-11-04 (×14): 2.5 mg via RESPIRATORY_TRACT
  Filled 2021-11-02 (×14): qty 3

## 2021-11-02 MED ORDER — LORAZEPAM 1 MG PO TABS
1.0000 mg | ORAL_TABLET | Freq: Three times a day (TID) | ORAL | Status: DC | PRN
  Filled 2021-11-02 (×2): qty 1

## 2021-11-02 MED ORDER — ACETAMINOPHEN 650 MG RE SUPP: 650.0000 mg | Freq: Four times a day (QID) | RECTAL | Status: DC | PRN

## 2021-11-02 MED ORDER — FUROSEMIDE 10 MG/ML IJ SOLN
40.0000 mg | Freq: Two times a day (BID) | INTRAMUSCULAR | Status: DC
  Administered 2021-11-02 – 2021-11-05 (×7): 40 mg via INTRAVENOUS
  Filled 2021-11-02 (×7): qty 4

## 2021-11-02 NOTE — ED Notes (Signed)
Was made aware by NT that the pt refused having CBG completed.  Will not administer insulin

## 2021-11-02 NOTE — ED Notes (Signed)
Pt called me into the room and states that we treating her poorly and don't like family because people down low mis treated and wants to

## 2021-11-02 NOTE — ED Notes (Signed)
Or refusing having her CBG taken

## 2021-11-02 NOTE — Inpatient Diabetes Management (Signed)
Inpatient Diabetes Program Recommendations  AACE/ADA: New Consensus Statement on Inpatient Glycemic Control (2015)  Target Ranges:  Prepandial:   less than 140 mg/dL      Peak postprandial:   less than 180 mg/dL (1-2 hours)      Critically ill patients:  140 - 180 mg/dL    Review of Glycemic Control  Latest Reference Range & Units 11/02/21 06:23  Glucose 70 - 99 mg/dL 312 (H)   Diabetes history: None Current orders for Inpatient glycemic control: None  Solumedrol 40 mg Q12 hours Glucose 312 Asthma exacerbation  Inpatient Diabetes Program Recommendations:    -  Consider CBGs and Novolog 0-15 units tid + hs scale  Thanks,  Tama Headings RN, MSN, BC-ADM Inpatient Diabetes Coordinator Team Pager [phone] (8a-5p)

## 2021-11-02 NOTE — ED Notes (Signed)
Pt given a full head to toe wash down at the bedside placed on admitting bed.  Purwick changed and gown changed

## 2021-11-02 NOTE — H&P (Signed)
History and Physical    Renee Paul MEQ:[id number] DOB: [birth year] DOA: 11/01/2021  PCP: [facility]  Patient coming from: Home.  Chief Complaint: Shortness of breath.  HPI: Renee Paul is a 36 y.o. female with history of CHF, asthma, morbid obesity and tobacco abuse was recently noted for CHF exacerbation and asthma exacerbation presents to the ER for the second time in 24 hours because of worsening shortness of breath.  Patient states since her discharge last week on August 5 patient has been living on the street not able to take her medications.  Gradually got short of breath denies any chest pain has been exam productive cough.  ED Course: In the ER patient is found to be diffusely wheezing with chest x-ray showing congestion.  Labs done yesterday was showing thrombocytopenia.  But patient did not wanted to be admitted at that time.  Repeat CBC is pending.  Patient was given nebulizer treatment steroid admitted for asthma exacerbation and also possible CHF exacerbation.  Review of Systems: As per HPI, rest all negative.   Past Medical History:  Diagnosis Date   Asthma    CHF (congestive heart failure) ([place])    Morbid obesity ([place])     Past Surgical History:  Procedure Laterality Date   CESAREAN SECTION     CESAREAN SECTION     CHOLECYSTECTOMY     INTRAUTERINE DEVICE INSERTION     IUD REMOVAL     TUBAL LIGATION       reports that she has been smoking cigarettes. She has a 9.00 pack-year smoking history. She has never used smokeless tobacco. She reports current alcohol use of about 8.0 standard drinks of alcohol per week. She reports that she does not use drugs.  No Known Allergies  History reviewed. No pertinent family history.  Prior to Admission medications   Medication Sig Start Date End Date Taking? Authorizing Provider  albuterol (PROVENTIL HFA;VENTOLIN HFA) 108 (90 Base) MCG/ACT inhaler Inhale 2 puffs into the lungs every 4  (four) hours as needed for wheezing or shortness of breath. 09/06/16  Yes Pollina, Gwenyth Allegra, MD  ferrous sulfate 325 (65 FE) MG tablet Take 1 tablet (325 mg total) by mouth daily with breakfast. 06/10/21  Yes Patrecia Pour, MD  furosemide (LASIX) 40 MG tablet Take 1 tablet (40 mg total) by mouth daily. 10/26/21  Yes Domenic Polite, MD  ibuprofen (ADVIL) 600 MG tablet Take 600 mg by mouth every 6 (six) hours as needed for mild pain.   Yes [provider]  megestrol (MEGACE) 40 MG tablet Take 1 tablet (40 mg total) by mouth 2 (two) times daily. 06/10/21  Yes Patrecia Pour, MD  nicotine (NICODERM CQ - DOSED IN MG/24 HOURS) 14 mg/24hr patch Place 1 patch (14 mg total) onto the skin daily. Patient not taking: Reported on 11/01/2021 10/27/21   Domenic Polite, MD  spironolactone (ALDACTONE) 25 MG tablet Take 1 tablet (25 mg total) by mouth daily. Patient not taking: Reported on 11/01/2021 10/27/21   Domenic Polite, MD    Physical Exam: Constitutional: Moderately built and nourished. Vitals:   11/02/21 0105 11/02/21 0106 11/02/21 0130 11/02/21 0149  BP:   (!) 125/90   Pulse: 64  72   Resp: 19  16   Temp:    98.2 F (36.8 C)  TempSrc:    Oral  SpO2: 98% 98% 95%   Weight:      Height:       Eyes: Anicteric  no pallor. ENMT: No discharge from the ears eyes nose and mouth. Neck: No mass felt.  No neck rigidity. Respiratory: Bilateral expiratory wheeze and no crepitations. Cardiovascular: S1-S2 heard. Abdomen: Soft nontender bowel sound present. Musculoskeletal: No edema. Skin: No rash. Neurologic: Alert awake oriented to time place and person.  Moves all extremities. Psychiatric: Appears normal.  Normal affect.   Labs on Admission: I have personally reviewed following labs and imaging studies  CBC: Recent Labs  Lab 10/31/21 2328 11/01/21 2300  WBC 8.9  --   NEUTROABS 6.8  --   HGB 13.2 15.0  HCT 44.7 44.0  MCV 92.5  --   PLT 50*  --    Basic Metabolic Panel: Recent Labs   Lab 10/26/21 0458 10/27/21 0225 10/31/21 2328 11/01/21 2255 11/01/21 2300  NA 136 137 139 140 141  K 3.6 3.8 3.9 4.2 4.2  CL 94* 97* 100 102 100  CO2 35* 33* 30 28  --   GLUCOSE 159* 118* 95 122* 121*  BUN '7 10 10 16 18  '$ CREATININE 0.69 0.65 0.77 0.90 0.80  CALCIUM 8.9 8.8* 8.4* 8.7*  --    GFR: Estimated Creatinine Clearance: 176.1 mL/min (by C-G formula based on SCr of 0.8 mg/dL). Liver Function Tests: Recent Labs  Lab 10/31/21 2328  AST 15  ALT 18  ALKPHOS 49  BILITOT 0.9  PROT 8.2*  ALBUMIN 3.5   No results for input(s): "LIPASE", "AMYLASE" in the last 168 hours. No results for input(s): "AMMONIA" in the last 168 hours. Coagulation Profile: No results for input(s): "INR", "PROTIME" in the last 168 hours. Cardiac Enzymes: No results for input(s): "CKTOTAL", "CKMB", "CKMBINDEX", "TROPONINI" in the last 168 hours. BNP (last 3 results) No results for input(s): "PROBNP" in the last 8760 hours. HbA1C: No results for input(s): "HGBA1C" in the last 72 hours. CBG: No results for input(s): "GLUCAP" in the last 168 hours. Lipid Profile: No results for input(s): "CHOL", "HDL", "LDLCALC", "TRIG", "CHOLHDL", "LDLDIRECT" in the last 72 hours. Thyroid Function Tests: No results for input(s): "TSH", "T4TOTAL", "FREET4", "T3FREE", "THYROIDAB" in the last 72 hours. Anemia Panel: No results for input(s): "VITAMINB12", "FOLATE", "FERRITIN", "TIBC", "IRON", "RETICCTPCT" in the last 72 hours. Urine analysis:    Component Value Date/Time   COLORURINE YELLOW 06/05/2021 0416   APPEARANCEUR CLEAR 06/05/2021 0416   LABSPEC 1.014 06/05/2021 0416   PHURINE 7.0 06/05/2021 0416   GLUCOSEU NEGATIVE 06/05/2021 0416   HGBUR LARGE (A) 06/05/2021 0416   BILIRUBINUR NEGATIVE 06/05/2021 0416   KETONESUR NEGATIVE 06/05/2021 0416   PROTEINUR NEGATIVE 06/05/2021 0416   NITRITE NEGATIVE 06/05/2021 0416   LEUKOCYTESUR NEGATIVE 06/05/2021 0416   Sepsis  Labs: '@LABRCNTIP'$ (procalcitonin:4,lacticidven:4) ) Recent Results (from the past 240 hour(s))  Respiratory (~20 pathogens) panel by PCR     Status: Abnormal   Collection Time: 10/24/21  8:40 PM   Specimen: Nasopharyngeal Swab; Respiratory  Result Value Ref Range Status   Adenovirus NOT DETECTED NOT DETECTED Final   Coronavirus 229E NOT DETECTED NOT DETECTED Final    Comment: (NOTE) The Coronavirus on the Respiratory Panel, DOES NOT test for the novel  Coronavirus (2019 nCoV)    Coronavirus HKU1 NOT DETECTED NOT DETECTED Final   Coronavirus NL63 NOT DETECTED NOT DETECTED Final   Coronavirus OC43 NOT DETECTED NOT DETECTED Final   Metapneumovirus NOT DETECTED NOT DETECTED Final   Rhinovirus / Enterovirus DETECTED (A) NOT DETECTED Final   Influenza A NOT DETECTED NOT DETECTED Final   Influenza B NOT DETECTED  NOT DETECTED Final   Parainfluenza Virus 1 NOT DETECTED NOT DETECTED Final   Parainfluenza Virus 2 NOT DETECTED NOT DETECTED Final   Parainfluenza Virus 3 NOT DETECTED NOT DETECTED Final   Parainfluenza Virus 4 NOT DETECTED NOT DETECTED Final   Respiratory Syncytial Virus NOT DETECTED NOT DETECTED Final   Bordetella pertussis NOT DETECTED NOT DETECTED Final   Bordetella Parapertussis NOT DETECTED NOT DETECTED Final   Chlamydophila pneumoniae NOT DETECTED NOT DETECTED Final   Mycoplasma pneumoniae NOT DETECTED NOT DETECTED Final    Comment: Performed at [facility], [place] [address], [place], [place] [address]  SARS Coronavirus 2 by RT PCR (hospital order, performed in [facility] hospital lab) *cepheid single result test* Anterior Nasal Swab     Status: None   Collection Time: 10/24/21  8:40 PM   Specimen: Anterior Nasal Swab  Result Value Ref Range Status   SARS Coronavirus 2 by RT PCR NEGATIVE NEGATIVE Final    Comment: (NOTE) SARS-CoV-2 target nucleic acids are NOT DETECTED.  The SARS-CoV-2 RNA is generally detectable in upper and lower respiratory specimens during the  acute phase of infection. The lowest concentration of SARS-CoV-2 viral copies this assay can detect is 250 copies / mL. A negative result does not preclude SARS-CoV-2 infection and should not be used as the sole basis for treatment or other patient management decisions.  A negative result may occur with improper specimen collection / handling, submission of specimen other than nasopharyngeal swab, presence of viral mutation(s) within the areas targeted by this assay, and inadequate number of viral copies (<250 copies / mL). A negative result must be combined with clinical observations, patient history, and epidemiological information.  Fact Sheet for Patients:   https://www.patel.info/  Fact Sheet for Healthcare Providers: https://hall.com/  This test is not yet approved or  cleared by the [place] FDA and has been authorized for detection and/or diagnosis of SARS-CoV-2 by FDA under an Emergency Use Authorization (EUA).  This EUA will remain in effect (meaning this test can be used) for the duration of the COVID-19 declaration under Section 564(b)(1) of the Act, 21 U.S.C. section 360bbb-3(b)(1), unless the authorization is terminated or revoked sooner.  Performed at [facility], [place] [address] [place], [place] [address]      Radiological Exams on Admission: DG Chest Portable 1 View  Result Date: 11/01/2021 CLINICAL DATA:  Wheezing, shortness of breath EXAM: PORTABLE CHEST 1 VIEW COMPARISON:  10/31/2021 FINDINGS: Cardiomegaly, vascular congestion. No overt edema. No confluent opacities or effusions. No acute bony abnormality. IMPRESSION: Cardiomegaly, vascular congestion. Electronically Signed   By: Rolm Baptise M.D.   On: 11/01/2021 22:50   DG Chest Portable 1 View  Result Date: 10/31/2021 CLINICAL DATA:  CHF, cough for 2 days, worsening shortness of breath EXAM: PORTABLE CHEST 1 VIEW COMPARISON:  10/23/2021 FINDINGS: Single  frontal view of the chest was obtained, limited by portable AP technique and patient body habitus. The cardiac silhouette is enlarged. Central vascular congestion without evidence of airspace disease, effusion, or pneumothorax. No acute bony abnormality. IMPRESSION: 1. Limited study due to portable technique and body habitus. 2. Persistent cardiomegaly and central vascular congestion. No overt edema. Electronically Signed   By: Randa Ngo M.D.   On: 10/31/2021 22:53    EKG: Independently reviewed.  Normal sinus rhythm.  Assessment/Plan Principal Problem:   Asthma exacerbation Active Problems:   Chronic iron deficiency anemia   Acute asthma exacerbation   OSA (obstructive sleep apnea)  Chronic diastolic CHF (congestive heart failure) ([facility])    Acute asthma exacerbation for which patient is placed on steroids nebulizer Pulmicort.  Check COVID test.  Recently was positive for rhinovirus. Acute on chronic diastolic CHF was not taking Lasix after discharge.  Chest x-ray does show congestion.  Last 2D echo showed EF of 60 to 65%.  Will keep patient on Lasix 40 mg IV every 12 closely follow intake output metabolic panel. Thrombocytopenia seen in the labs done yesterday.  Will repeat CBC to see if it is real.  Check smear review.  Check LDH. Chronic iron deficient anemia continue with iron supplements.  Follow CBC. Morbid obesity will need nutritional counseling.  BMI is 83.  Since patient is acute asthma exacerbation and CHF exacerbation will need close monitoring inpatient status.   DVT prophylaxis: SCDs.  Avoiding anticoagulation in the setting of thrombocytopenia.  Repeat CBC is pending. Code Status: Full code. Family Communication: Discussed with patient. Disposition Plan: Home. Consults called: None. Admission status: Observation.   Rise Patience MD [facility] Pager [phone].  If 7PM-7AM, please contact night-coverage [email or web address] Password [email or web address]  11/02/2021,  1:56 AM

## 2021-11-02 NOTE — ED Notes (Signed)
Pt refused blood work and CBG

## 2021-11-02 NOTE — ED Notes (Signed)
Pt agitated and verbally aggressive w/ primary RN. Can be heard from nurses station.

## 2021-11-02 NOTE — ED Notes (Signed)
Pt refused CBG.

## 2021-11-02 NOTE — ED Notes (Signed)
Pt. Refused to let me check her sugar. PT. stated that we want to do everything to her, but put her in a room. RN made aware

## 2021-11-02 NOTE — ED Notes (Signed)
Pt continuing to escalate and become more verbally aggressive w/ staff. CN and security at bedside.

## 2021-11-02 NOTE — Progress Notes (Signed)
Pt roomed to unit from ED. Alert, oriented and able to communicate needs

## 2021-11-02 NOTE — ED Notes (Signed)
Crackers and juice provided

## 2021-11-02 NOTE — ED Provider Notes (Signed)
  Provider Note MRN:  [id number]  Arrival date & time: 11/02/21    ED Course and Medical Decision Making  Assumed care from [facility] at shift change.  See note from prior team for complete details, in brief:  36 year old female Was here yesterday AM, ended up leaving AMA- was hypoxic on RA at that time down to 67% per chart review History asthma, CHF (1L [place] as needed), OSA (w/ poor CPAP compliance) To the ED with worsening dyspnea, productive cough with green-colored phlegm, congestion, postnasal drip Diffuse wheezing on exam, Give nebs, solumedrol Chest x-ray with cardiomegaly, no pneumonia Will desaturate on 1 L nasal cannula, now requiring 3 L nasal cannula Concern for asthma exacerbation Plan per prior team was for admission, awaiting callback from hospitalist Spoke with Dr Hal Hope who accepts pt for admission     .Critical Care  Performed by: Jeanell Sparrow, DO Authorized by: Jeanell Sparrow, DO   Critical care provider statement:    Critical care time (minutes):  30   Critical care time was exclusive of:  Separately billable procedures and treating other patients   Critical care was necessary to treat or prevent imminent or life-threatening deterioration of the following conditions:  Respiratory failure   Critical care was time spent personally by me on the following activities:  Development of treatment plan with patient or surrogate, discussions with consultants, evaluation of patient's response to treatment, examination of patient, ordering and review of laboratory studies, ordering and review of radiographic studies, ordering and performing treatments and interventions, pulse oximetry, re-evaluation of patient's condition, review of old charts and obtaining history from patient or surrogate   Care discussed with: admitting provider    Counseled patient for approximately 2 minutes regarding smoking cessation. Discussed risks of smoking and how they applied and affected their  visit here today. Patient not ready to quit at this time, however will follow up with their primary doctor when they are.   CPT code: [phone]: intermediate counseling for smoking cessation   Final Clinical Impressions(s) / ED Diagnoses     ICD-10-CM   1. Hypoxia  R09.02     2. Moderate persistent asthma with exacerbation  J45.41     3. Acute respiratory failure with hypoxia [facility])  J96.01       ED Discharge Orders     None       Discharge Instructions   None        Jeanell Sparrow, DO 11/02/21 5726

## 2021-11-02 NOTE — Progress Notes (Addendum)
Patient refused to take insulin, she said she is not diabetic. Complaining of right groin pain, refused to take tylenol, informed to NP Hassan Rowan, but she don't like to add additional pain med. Patient get agitated and restless, screaming why they don't want to give her pain medication that is what she need not insulin. Told her that I just follow what the order is. NP Hassan Rowan notified about the incident.

## 2021-11-02 NOTE — Progress Notes (Signed)
Patient seen and examined, admitted by Dr. Hal Hope this morning.   Briefly 36 year old female with history of asthma, morbid obesity, nicotine use, chronic diastolic CHF presented to ED for the second time in 24 hours due to worsening shortness of breath.  Patient was discharged on 8/5 (admitted 7/31-10/27/2021 for acute on chronic diastolic CHF, acute asthma), has not been able to take her medications, has been living on the street. In ED, noted to be diffusely wheezing.  BP 128/87   Pulse 84   Temp 98.9 F (37.2 C) (Oral)   Resp 20   Ht '5\' 3"'$  (1.6 m)   Wt (!) 214 kg   LMP  (LMP Unknown)   SpO2 92%   BMI 83.57 kg/m    Physical Exam General: Alert and oriented x 3, NAD Cardiovascular: S1 S2 clear, RRR.  Respiratory: Bilateral expiratory wheezing Gastrointestinal: Obese, soft, nontender, nondistended, NBS Ext: no pedal edema bilaterally Neuro: no new deficits Psych: Normal affect and demeanor, alert and oriented x3   Labs reviewed, CBC, BMET unremarkable  Acute asthma exacerbation: -Currently wheezing, COVID test pending -Continue current management, IV Solu-Medrol, scheduled nebs, Pulmicort, flutter valve -Counseled on smoking cessation  Acute on chronic diastolic CHF -Recent echo 05/2021 had shown EF of 60 to 65%.   -Continue strict I's and O's and daily weights, IV diuresis   Jahziah Simonin M.D.  [facility] 11/02/2021, 9:20 AM

## 2021-11-03 DIAGNOSIS — E669 Obesity, unspecified: Secondary | ICD-10-CM

## 2021-11-03 DIAGNOSIS — J4541 Moderate persistent asthma with (acute) exacerbation: Secondary | ICD-10-CM | POA: Diagnosis not present

## 2021-11-03 LAB — HEMOGLOBIN A1C
Hgb A1c MFr Bld: 6.2 % — ABNORMAL HIGH (ref 4.8–5.6)
Mean Plasma Glucose: 131.24 mg/dL

## 2021-11-03 LAB — GLUCOSE, CAPILLARY
Glucose-Capillary: 323 mg/dL — ABNORMAL HIGH (ref 70–99)
Glucose-Capillary: 254 mg/dL — ABNORMAL HIGH (ref 70–99)
Glucose-Capillary: 415 mg/dL — ABNORMAL HIGH (ref 70–99)
Glucose-Capillary: 429 mg/dL — ABNORMAL HIGH (ref 70–99)

## 2021-11-03 MED ORDER — LINAGLIPTIN 5 MG PO TABS
5.0000 mg | ORAL_TABLET | Freq: Every day | ORAL | Status: DC
  Administered 2021-11-03 – 2021-11-06 (×3): 5 mg via ORAL
  Filled 2021-11-03 (×4): qty 1

## 2021-11-03 MED ORDER — INSULIN ASPART 100 UNIT/ML IJ SOLN: 0.0000 [IU] | Freq: Every day | INTRAMUSCULAR | Status: DC

## 2021-11-03 MED ORDER — BENZONATATE 100 MG PO CAPS
100.0000 mg | ORAL_CAPSULE | Freq: Three times a day (TID) | ORAL | Status: DC | PRN
  Administered 2021-11-03 – 2021-11-05 (×4): 100 mg via ORAL
  Filled 2021-11-03 (×5): qty 1

## 2021-11-03 MED ORDER — INSULIN ASPART 100 UNIT/ML IJ SOLN: 0.0000 [IU] | Freq: Three times a day (TID) | INTRAMUSCULAR | Status: DC

## 2021-11-03 NOTE — Progress Notes (Signed)
PROGRESS NOTE    Renee Paul  XBL:[id number] DOB: 01-24-1986 DOA: 11/01/2021 PCP: Medicine, [place]    Brief Narrative:  Renee Paul is a 36 y.o. female with history of CHF, asthma, morbid obesity and tobacco abuse was recently noted for CHF exacerbation and asthma exacerbation presents to the ER for the second time in 24 hours because of worsening shortness of breath.  Patient states since her discharge last week on August 5 patient has been living on the street not able to take her medications.  Gradually got short of breath denies any chest pain has been exam productive cough.   Assessment and Plan:  Acute asthma exacerbation  - steroids  -nebulizer  -Pulmicort.   - Recently was positive for rhinovirus. Flutter valve -wean to RA  Acute on chronic diastolic CHF was not taking Lasix after discharge.  Chest x-ray does show congestion.  Last 2D echo showed EF of 60 to 65%.   - keep patient on Lasix 40 mg IV every 12 closely follow intake output metabolic panel. -daily weights  Hyperglycemia -change diet -refusing insulin   Chronic iron deficient anemia  -continue with iron supplements  Morbid obesity Estimated body mass index is 83.57 kg/m as calculated from the following:   Height as of this encounter: '5\' 3"'$  (1.6 m).   Weight as of this encounter: 214 kg.    DVT prophylaxis: SCDs Start: 11/02/21 0153    Code Status: Full Code Family Communication:   Disposition Plan:  Level of care: Telemetry Medical Status is: Inpatient Remains inpatient appropriate because: needs     Consultants:  none   Subjective: Upset about blood sugars being elevated  Objective: Vitals:   11/03/21 0305 11/03/21 0441 11/03/21 0734 11/03/21 0832  BP:  109/69 134/89   Pulse:  70 64 68  Resp:  '18 16 16  '$ Temp:  98.3 F (36.8 C) 98.5 F (36.9 C)   TempSrc:  Oral Oral   SpO2: 91% 95% 96% 94%  Weight:      Height:        Intake/Output Summary  (Last 24 hours) at 11/03/2021 1140 Last data filed at 11/02/2021 1156 Gross per 24 hour  Intake --  Output 900 ml  Net -900 ml   Filed Weights   11/01/21 2317  Weight: (!) 214 kg    Examination:   General: Appearance:    Severely obese female in no acute distress     Lungs:     No wheezing, diminished, respirations unlabored  Heart:    Normal heart rate.    MS:   All extremities are intact.    Neurologic:   Awake, alert       Data Reviewed: I have personally reviewed following labs and imaging studies  CBC: Recent Labs  Lab 10/31/21 2328 11/01/21 2300 11/02/21 0623  WBC 8.9  --  10.3  NEUTROABS 6.8  --  9.8*  HGB 13.2 15.0 13.1  HCT 44.7 44.0 44.4  MCV 92.5  --  93.9  PLT 50*  --  300   Basic Metabolic Panel: Recent Labs  Lab 10/31/21 2328 11/01/21 2255 11/01/21 2300 11/02/21 0623  NA 139 140 141 137  K 3.9 4.2 4.2 4.1  CL 100 102 100 98  CO2 30 28  --  29  GLUCOSE 95 122* 121* 312*  BUN '10 16 18 16  '$ CREATININE 0.77 0.90 0.80 1.00  CALCIUM 8.4* 8.7*  --  8.6*  MG  --   --   --  2.1   GFR: Estimated Creatinine Clearance: 140.9 mL/min (by C-G formula based on SCr of 1 mg/dL). Liver Function Tests: Recent Labs  Lab 10/31/21 2328 11/02/21 0623  AST 15 17  ALT 18 20  ALKPHOS 49 49  BILITOT 0.9 0.8  PROT 8.2* 8.4*  ALBUMIN 3.5 3.3*   No results for input(s): "LIPASE", "AMYLASE" in the last 168 hours. No results for input(s): "AMMONIA" in the last 168 hours. Coagulation Profile: No results for input(s): "INR", "PROTIME" in the last 168 hours. Cardiac Enzymes: No results for input(s): "CKTOTAL", "CKMB", "CKMBINDEX", "TROPONINI" in the last 168 hours. BNP (last 3 results) No results for input(s): "PROBNP" in the last 8760 hours. HbA1C: Recent Labs    11/03/21 0058  HGBA1C 6.2*   CBG: Recent Labs  Lab 11/02/21 2215 11/03/21 0805 11/03/21 1118  GLUCAP 370* 323* 254*   Lipid Profile: No results for input(s): "CHOL", "HDL", "LDLCALC",  "TRIG", "CHOLHDL", "LDLDIRECT" in the last 72 hours. Thyroid Function Tests: No results for input(s): "TSH", "T4TOTAL", "FREET4", "T3FREE", "THYROIDAB" in the last 72 hours. Anemia Panel: No results for input(s): "VITAMINB12", "FOLATE", "FERRITIN", "TIBC", "IRON", "RETICCTPCT" in the last 72 hours. Sepsis Labs: No results for input(s): "PROCALCITON", "LATICACIDVEN" in the last 168 hours.  Recent Results (from the past 240 hour(s))  Respiratory (~20 pathogens) panel by PCR     Status: Abnormal   Collection Time: 10/24/21  8:40 PM   Specimen: Nasopharyngeal Swab; Respiratory  Result Value Ref Range Status   Adenovirus NOT DETECTED NOT DETECTED Final   Coronavirus 229E NOT DETECTED NOT DETECTED Final    Comment: (NOTE) The Coronavirus on the Respiratory Panel, DOES NOT test for the novel  Coronavirus (2019 nCoV)    Coronavirus HKU1 NOT DETECTED NOT DETECTED Final   Coronavirus NL63 NOT DETECTED NOT DETECTED Final   Coronavirus OC43 NOT DETECTED NOT DETECTED Final   Metapneumovirus NOT DETECTED NOT DETECTED Final   Rhinovirus / Enterovirus DETECTED (A) NOT DETECTED Final   Influenza A NOT DETECTED NOT DETECTED Final   Influenza B NOT DETECTED NOT DETECTED Final   Parainfluenza Virus 1 NOT DETECTED NOT DETECTED Final   Parainfluenza Virus 2 NOT DETECTED NOT DETECTED Final   Parainfluenza Virus 3 NOT DETECTED NOT DETECTED Final   Parainfluenza Virus 4 NOT DETECTED NOT DETECTED Final   Respiratory Syncytial Virus NOT DETECTED NOT DETECTED Final   Bordetella pertussis NOT DETECTED NOT DETECTED Final   Bordetella Parapertussis NOT DETECTED NOT DETECTED Final   Chlamydophila pneumoniae NOT DETECTED NOT DETECTED Final   Mycoplasma pneumoniae NOT DETECTED NOT DETECTED Final    Comment: Performed at [facility], [place]. [address] [place], [place] [address]  SARS Coronavirus 2 by RT PCR (hospital order, performed in [facility] hospital lab) *cepheid single result test* Anterior Nasal Swab      Status: None   Collection Time: 10/24/21  8:40 PM   Specimen: Anterior Nasal Swab  Result Value Ref Range Status   SARS Coronavirus 2 by RT PCR NEGATIVE NEGATIVE Final    Comment: (NOTE) SARS-CoV-2 target nucleic acids are NOT DETECTED.  The SARS-CoV-2 RNA is generally detectable in upper and lower respiratory specimens during the acute phase of infection. The lowest concentration of SARS-CoV-2 viral copies this assay can detect is 250 copies / mL. A negative result does not preclude SARS-CoV-2 infection and should not be used as the sole basis for treatment or other patient management decisions.  A negative result may occur with improper specimen  collection / handling, submission of specimen other than nasopharyngeal swab, presence of viral mutation(s) within the areas targeted by this assay, and inadequate number of viral copies (<250 copies / mL). A negative result must be combined with clinical observations, patient history, and epidemiological information.  Fact Sheet for Patients:   https://www.patel.info/  Fact Sheet for Healthcare Providers: https://hall.com/  This test is not yet approved or  cleared by the [place] FDA and has been authorized for detection and/or diagnosis of SARS-CoV-2 by FDA under an Emergency Use Authorization (EUA).  This EUA will remain in effect (meaning this test can be used) for the duration of the COVID-19 declaration under Section 564(b)(1) of the Act, 21 U.S.C. section 360bbb-3(b)(1), unless the authorization is terminated or revoked sooner.  Performed at [facility], [place] [address], [place], [place] [address]   SARS Coronavirus 2 by RT PCR (hospital order, performed in [facility] hospital lab) *cepheid single result test* Anterior Nasal Swab     Status: None   Collection Time: 11/02/21 10:51 AM   Specimen: Anterior Nasal Swab  Result Value Ref Range Status   SARS Coronavirus 2 by  RT PCR NEGATIVE NEGATIVE Final    Comment: (NOTE) SARS-CoV-2 target nucleic acids are NOT DETECTED.  The SARS-CoV-2 RNA is generally detectable in upper and lower respiratory specimens during the acute phase of infection. The lowest concentration of SARS-CoV-2 viral copies this assay can detect is 250 copies / mL. A negative result does not preclude SARS-CoV-2 infection and should not be used as the sole basis for treatment or other patient management decisions.  A negative result may occur with improper specimen collection / handling, submission of specimen other than nasopharyngeal swab, presence of viral mutation(s) within the areas targeted by this assay, and inadequate number of viral copies (<250 copies / mL). A negative result must be combined with clinical observations, patient history, and epidemiological information.  Fact Sheet for Patients:   https://www.patel.info/  Fact Sheet for Healthcare Providers: https://hall.com/  This test is not yet approved or  cleared by the [place] FDA and has been authorized for detection and/or diagnosis of SARS-CoV-2 by FDA under an Emergency Use Authorization (EUA).  This EUA will remain in effect (meaning this test can be used) for the duration of the COVID-19 declaration under Section 564(b)(1) of the Act, 21 U.S.C. section 360bbb-3(b)(1), unless the authorization is terminated or revoked sooner.  Performed at [facility], [place] [address] [place], [place] [address]          Radiology Studies: DG Chest Portable 1 View  Result Date: 11/01/2021 CLINICAL DATA:  Wheezing, shortness of breath EXAM: PORTABLE CHEST 1 VIEW COMPARISON:  10/31/2021 FINDINGS: Cardiomegaly, vascular congestion. No overt edema. No confluent opacities or effusions. No acute bony abnormality. IMPRESSION: Cardiomegaly, vascular congestion. Electronically Signed   By: Rolm Baptise M.D.   On: 11/01/2021 22:50         Scheduled Meds:  albuterol  2.5 mg Nebulization Q4H   budesonide (PULMICORT) nebulizer solution  0.25 mg Nebulization BID   ferrous sulfate  325 mg Oral Q breakfast   furosemide  40 mg Intravenous BID   megestrol  40 mg Oral BID   methylPREDNISolone (SOLU-MEDROL) injection  40 mg Intravenous Q12H   Continuous Infusions:   LOS: 1 day    Time spent: 45 minutes spent on chart review, discussion with nursing staff, consultants, updating family and interview/physical exam; more than 50% of that time was  spent in counseling and/or coordination of care.    Geradine Girt, DO [facility] Available via Epic secure chat 7am-7pm After these hours, please refer to coverage provider listed on [email or web address] 11/03/2021, 11:40 AM

## 2021-11-04 DIAGNOSIS — J4541 Moderate persistent asthma with (acute) exacerbation: Secondary | ICD-10-CM | POA: Diagnosis not present

## 2021-11-04 LAB — BASIC METABOLIC PANEL
Anion gap: 13 (ref 5–15)
BUN: 20 mg/dL (ref 6–20)
CO2: 28 mmol/L (ref 22–32)
Calcium: 9.2 mg/dL (ref 8.9–10.3)
Chloride: 96 mmol/L — ABNORMAL LOW (ref 98–111)
Creatinine, Ser: 0.89 mg/dL (ref 0.44–1.00)
GFR, Estimated: >60 mL/min (ref >60)
Glucose, Bld: 359 mg/dL — ABNORMAL HIGH (ref 70–99)
Potassium: 4.3 mmol/L (ref 3.5–5.1)
Sodium: 137 mmol/L (ref 135–145)

## 2021-11-04 LAB — GLUCOSE, CAPILLARY
Glucose-Capillary: 381 mg/dL — ABNORMAL HIGH (ref 70–99)
Glucose-Capillary: 340 mg/dL — ABNORMAL HIGH (ref 70–99)
Glucose-Capillary: 377 mg/dL — ABNORMAL HIGH (ref 70–99)
Glucose-Capillary: 451 mg/dL — ABNORMAL HIGH (ref 70–99)

## 2021-11-04 MED ORDER — ALBUTEROL SULFATE (2.5 MG/3ML) 0.083% IN NEBU
2.5000 mg | INHALATION_SOLUTION | Freq: Three times a day (TID) | RESPIRATORY_TRACT | Status: DC
  Administered 2021-11-04 – 2021-11-06 (×5): 2.5 mg via RESPIRATORY_TRACT
  Filled 2021-11-04 (×5): qty 3

## 2021-11-04 MED ORDER — MELATONIN 3 MG PO TABS
3.0000 mg | ORAL_TABLET | Freq: Every day | ORAL | Status: DC
  Administered 2021-11-04 – 2021-11-05 (×3): 3 mg via ORAL
  Filled 2021-11-04 (×3): qty 1

## 2021-11-04 MED ORDER — PREDNISONE 20 MG PO TABS
40.0000 mg | ORAL_TABLET | Freq: Every day | ORAL | Status: DC
  Administered 2021-11-05 – 2021-11-06 (×2): 40 mg via ORAL
  Filled 2021-11-04 (×2): qty 2

## 2021-11-04 NOTE — Progress Notes (Signed)
PROGRESS NOTE    Renee Paul  RKY:[id number] DOB: Jul 19, 1985 DOA: 11/01/2021 PCP: Medicine, [place]    Brief Narrative:  Renee Paul is a 36 y.o. female with history of CHF, asthma, morbid obesity and tobacco abuse was recently noted for CHF exacerbation and asthma exacerbation presents to the ER for the second time in 24 hours because of worsening shortness of breath.  Patient states since her discharge last week on August 5 patient has been living on the street not able to take her medications.  Gradually got short of breath denies any chest pain has been exam productive cough.   Assessment and Plan:  Acute asthma exacerbation  - steroids - change to PO -nebulizer  -Pulmicort.   - Recently was positive for rhinovirus. Flutter valve -wean to RA  Acute on chronic diastolic CHF was not taking Lasix after discharge.  Chest x-ray does show congestion.  Last 2D echo showed EF of 60 to 65%.   - keep patient on Lasix 40 mg IV every 12 closely follow intake output metabolic panel. -daily weights  Hyperglycemia -change diet to carb mod -refusing insulin intermittently -encourage lifestyle changes  Chronic iron deficient anemia  -continue with iron supplements  Morbid obesity Estimated body mass index is 82.64 kg/m as calculated from the following:   Height as of this encounter: '5\' 3"'$  (1.6 m).   Weight as of this encounter: 211.6 kg.    DVT prophylaxis: SCDs Start: 11/02/21 0153    Code Status: Full Code Family Communication:   Disposition Plan:  Level of care: Med-Surg Status is: Inpatient Remains inpatient appropriate because: needs     Consultants:  none   Subjective: Upset about blood sugars being elevated  Objective: Vitals:   11/04/21 0500 11/04/21 0854 11/04/21 0856 11/04/21 1139  BP:  135/81    Pulse:  68    Resp:  17    Temp:  98.8 F (37.1 C)    TempSrc:  Oral    SpO2:  94% 95% 96%  Weight: (!) 211.6 kg      Height:        Intake/Output Summary (Last 24 hours) at 11/04/2021 1306 Last data filed at 11/04/2021 0800 Gross per 24 hour  Intake 240 ml  Output 600 ml  Net -360 ml   Filed Weights   11/01/21 2317 11/04/21 0500  Weight: (!) 214 kg (!) 211.6 kg    Examination:   General: Appearance:    Severely obese female in no acute distress     Lungs:     No wheezing, diminished, respirations unlabored  Heart:    Normal heart rate.    MS:   All extremities are intact.    Neurologic:   Awake, alert       Data Reviewed: I have personally reviewed following labs and imaging studies  CBC: Recent Labs  Lab 10/31/21 2328 11/01/21 2300 11/02/21 0623  WBC 8.9  --  10.3  NEUTROABS 6.8  --  9.8*  HGB 13.2 15.0 13.1  HCT 44.7 44.0 44.4  MCV 92.5  --  93.9  PLT 50*  --  315   Basic Metabolic Panel: Recent Labs  Lab 10/31/21 2328 11/01/21 2255 11/01/21 2300 11/02/21 0623 11/04/21 1042  NA 139 140 141 137 137  K 3.9 4.2 4.2 4.1 4.3  CL 100 102 100 98 96*  CO2 30 28  --  29 28  GLUCOSE 95 122* 121* 312* 359*  BUN 10 16 18  16 20  CREATININE 0.77 0.90 0.80 1.00 0.89  CALCIUM 8.4* 8.7*  --  8.6* 9.2  MG  --   --   --  2.1  --    GFR: Estimated Creatinine Clearance: 157.1 mL/min (by C-G formula based on SCr of 0.89 mg/dL). Liver Function Tests: Recent Labs  Lab 10/31/21 2328 11/02/21 0623  AST 15 17  ALT 18 20  ALKPHOS 49 49  BILITOT 0.9 0.8  PROT 8.2* 8.4*  ALBUMIN 3.5 3.3*   No results for input(s): "LIPASE", "AMYLASE" in the last 168 hours. No results for input(s): "AMMONIA" in the last 168 hours. Coagulation Profile: No results for input(s): "INR", "PROTIME" in the last 168 hours. Cardiac Enzymes: No results for input(s): "CKTOTAL", "CKMB", "CKMBINDEX", "TROPONINI" in the last 168 hours. BNP (last 3 results) No results for input(s): "PROBNP" in the last 8760 hours. HbA1C: Recent Labs    11/03/21 0058  HGBA1C 6.2*   CBG: Recent Labs  Lab  11/03/21 1118 11/03/21 1651 11/03/21 2140 11/04/21 0920 11/04/21 1142  GLUCAP 254* 415* 429* 381* 340*   Lipid Profile: No results for input(s): "CHOL", "HDL", "LDLCALC", "TRIG", "CHOLHDL", "LDLDIRECT" in the last 72 hours. Thyroid Function Tests: No results for input(s): "TSH", "T4TOTAL", "FREET4", "T3FREE", "THYROIDAB" in the last 72 hours. Anemia Panel: No results for input(s): "VITAMINB12", "FOLATE", "FERRITIN", "TIBC", "IRON", "RETICCTPCT" in the last 72 hours. Sepsis Labs: No results for input(s): "PROCALCITON", "LATICACIDVEN" in the last 168 hours.  Recent Results (from the past 240 hour(s))  SARS Coronavirus 2 by RT PCR (hospital order, performed in [facility] hospital lab) *cepheid single result test* Anterior Nasal Swab     Status: None   Collection Time: 11/02/21 10:51 AM   Specimen: Anterior Nasal Swab  Result Value Ref Range Status   SARS Coronavirus 2 by RT PCR NEGATIVE NEGATIVE Final    Comment: (NOTE) SARS-CoV-2 target nucleic acids are NOT DETECTED.  The SARS-CoV-2 RNA is generally detectable in upper and lower respiratory specimens during the acute phase of infection. The lowest concentration of SARS-CoV-2 viral copies this assay can detect is 250 copies / mL. A negative result does not preclude SARS-CoV-2 infection and should not be used as the sole basis for treatment or other patient management decisions.  A negative result may occur with improper specimen collection / handling, submission of specimen other than nasopharyngeal swab, presence of viral mutation(s) within the areas targeted by this assay, and inadequate number of viral copies (<250 copies / mL). A negative result must be combined with clinical observations, patient history, and epidemiological information.  Fact Sheet for Patients:   https://www.patel.info/  Fact Sheet for Healthcare Providers: https://hall.com/  This test is not yet approved or   cleared by the [place] FDA and has been authorized for detection and/or diagnosis of SARS-CoV-2 by FDA under an Emergency Use Authorization (EUA).  This EUA will remain in effect (meaning this test can be used) for the duration of the COVID-19 declaration under Section 564(b)(1) of the Act, 21 U.S.C. section 360bbb-3(b)(1), unless the authorization is terminated or revoked sooner.  Performed at [facility], [place] [address], [place], [place] [address]          Radiology Studies: No results found.      Scheduled Meds:  albuterol  2.5 mg Nebulization Q4H   budesonide (PULMICORT) nebulizer solution  0.25 mg Nebulization BID   ferrous sulfate  325 mg Oral Q breakfast   furosemide  40 mg Intravenous  BID   insulin aspart  0-20 Units Subcutaneous TID WC   insulin aspart  0-5 Units Subcutaneous QHS   linagliptin  5 mg Oral Daily   megestrol  40 mg Oral BID   melatonin  3 mg Oral QHS   [START ON 11/05/2021] predniSONE  40 mg Oral Q breakfast   Continuous Infusions:   LOS: 2 days    Time spent: 45 minutes spent on chart review, discussion with nursing staff, consultants, updating family and interview/physical exam; more than 50% of that time was spent in counseling and/or coordination of care.    Geradine Girt, DO [facility] Available via Epic secure chat 7am-7pm After these hours, please refer to coverage provider listed on [email or web address] 11/04/2021, 1:06 PM

## 2021-11-05 ENCOUNTER — Inpatient Hospital Stay (HOSPITAL_COMMUNITY): Payer: No Typology Code available for payment source

## 2021-11-05 DIAGNOSIS — J9601 Acute respiratory failure with hypoxia: Secondary | ICD-10-CM | POA: Diagnosis not present

## 2021-11-05 DIAGNOSIS — J4541 Moderate persistent asthma with (acute) exacerbation: Secondary | ICD-10-CM | POA: Diagnosis not present

## 2021-11-05 LAB — GLUCOSE, CAPILLARY
Glucose-Capillary: 144 mg/dL — ABNORMAL HIGH (ref 70–99)
Glucose-Capillary: 117 mg/dL — ABNORMAL HIGH (ref 70–99)
Glucose-Capillary: 366 mg/dL — ABNORMAL HIGH (ref 70–99)

## 2021-11-05 MED ORDER — LORAZEPAM 2 MG/ML IJ SOLN
0.5000 mg | Freq: Once | INTRAMUSCULAR | Status: AC
  Administered 2021-11-05: 0.5 mg via INTRAVENOUS
  Filled 2021-11-05: qty 1

## 2021-11-05 MED ORDER — IOHEXOL 350 MG/ML SOLN
100.0000 mL | Freq: Once | INTRAVENOUS | Status: AC | PRN
  Administered 2021-11-05: 100 mL via INTRAVENOUS

## 2021-11-05 MED ORDER — METFORMIN HCL 500 MG PO TABS: 500.0000 mg | ORAL_TABLET | Freq: Every day | ORAL | Status: DC

## 2021-11-05 MED ORDER — FUROSEMIDE 10 MG/ML IJ SOLN
60.0000 mg | Freq: Two times a day (BID) | INTRAMUSCULAR | Status: DC
  Administered 2021-11-05 – 2021-11-06 (×2): 60 mg via INTRAVENOUS
  Filled 2021-11-05 (×2): qty 6

## 2021-11-05 NOTE — Progress Notes (Signed)
I have been in patient's room 3 times trying to walk her to perform her O2 test and weigh her but she refused and said she would call me when she was ready. The NT has been in patient's room for the same items and been refused several times also.  Mobility tech went into room to perform the same items and patient refused her until after breakfast which she just ordered.

## 2021-11-05 NOTE — Plan of Care (Signed)

## 2021-11-05 NOTE — Progress Notes (Signed)
Patient quite hypoxic with exertion.  Have ordered CT scan and discussed concerns with patient.  She is currently agreeable for scan with ativan prior. Eulogio Bear DO

## 2021-11-05 NOTE — Progress Notes (Signed)
SATURATION QUALIFICATIONS: (This note is used to comply with regulatory documentation for home oxygen)  Patient Saturations on Room Air at Rest = 96%  Patient Saturations on Room Air while Ambulating = 78%  Patient Saturations on 3 Liters of oxygen while Ambulating = 92%  Please briefly explain why patient needs home oxygen:

## 2021-11-05 NOTE — Progress Notes (Signed)
PROGRESS NOTE    Renee Paul  IOX:[id number] DOB: September 15, 1985 DOA: 11/01/2021 PCP: Medicine, [facility]    Brief Narrative:  Renee Paul is a 36 y.o. female with history of CHF, asthma, morbid obesity and tobacco abuse was recently noted for CHF exacerbation and asthma exacerbation presents to the ER for the second time in 24 hours because of worsening shortness of breath.  Patient states since her discharge last week on August 5 patient has been living on the street not able to take her medications.  Gradually got short of breath denies any chest pain has been exam productive cough.   Assessment and Plan:  Acute asthma exacerbation  plus CHF exacerbation - steroids - change to PO -nebulizer  -Pulmicort.   - Recently was positive for rhinovirus. Flutter valve -wean to RA- home O2 study -may need O2 at home  Acute on chronic diastolic CHF was not taking Lasix after discharge.  Chest x-ray does show congestion.  Last 2D echo showed EF of 60 to 65%.   - keep patient on Lasix 60 mg IV every 12 closely follow intake output metabolic panel. -daily weights- intermittently refusing -I/Os not being kept   Hyperglycemia -change diet to carb mod -refusing insulin  -add metformin- refused tradjenta -encourage lifestyle changes -multiple discussions about needs for treatment while on steroids-- patient refusing  Chronic iron deficient anemia  -continue with iron supplements  Morbid obesity Estimated body mass index is 82.64 kg/m as calculated from the following:   Height as of this encounter: '5\' 3"'$  (1.6 m).   Weight as of this encounter: 211.6 kg.    DVT prophylaxis: SCDs Start: 11/02/21 0153    Code Status: Full Code Family Communication:   Disposition Plan:  Level of care: Med-Surg Status is: Inpatient Remains inpatient appropriate because: still requiring O2    Consultants:  none   Subjective: Refusing to walk for ambulatory O2  sat  Objective: Vitals:   11/04/21 2137 11/05/21 0834 11/05/21 0925 11/05/21 0929  BP: 121/69 138/84    Pulse: 68 62 (!) 59   Resp: '20 18 16   '$ Temp: 98.7 F (37.1 C) 98 F (36.7 C)    TempSrc: Oral     SpO2: 96% 91% 95% 95%  Weight:      Height:        Intake/Output Summary (Last 24 hours) at 11/05/2021 1344 Last data filed at 11/05/2021 1011 Gross per 24 hour  Intake 460 ml  Output --  Net 460 ml   Filed Weights   11/01/21 2317 11/04/21 0500  Weight: (!) 214 kg (!) 211.6 kg    Examination:    General: Appearance:    Severely obese female in no acute distress     Lungs:     Crackles at bases, respirations unlabored  Heart:    Bradycardic.   MS:   All extremities are intact.   Neurologic:   Awake, alert         Data Reviewed: I have personally reviewed following labs and imaging studies  CBC: Recent Labs  Lab 10/31/21 2328 11/01/21 2300 11/02/21 0623  WBC 8.9  --  10.3  NEUTROABS 6.8  --  9.8*  HGB 13.2 15.0 13.1  HCT 44.7 44.0 44.4  MCV 92.5  --  93.9  PLT 50*  --  268   Basic Metabolic Panel: Recent Labs  Lab 10/31/21 2328 11/01/21 2255 11/01/21 2300 11/02/21 0623 11/04/21 1042  NA 139 140 141 137 137  K 3.9 4.2 4.2 4.1 4.3  CL 100 102 100 98 96*  CO2 30 28  --  29 28  GLUCOSE 95 122* 121* 312* 359*  BUN '10 16 18 16 20  '$ CREATININE 0.77 0.90 0.80 1.00 0.89  CALCIUM 8.4* 8.7*  --  8.6* 9.2  MG  --   --   --  2.1  --    GFR: Estimated Creatinine Clearance: 157.1 mL/min (by C-G formula based on SCr of 0.89 mg/dL). Liver Function Tests: Recent Labs  Lab 10/31/21 2328 11/02/21 0623  AST 15 17  ALT 18 20  ALKPHOS 49 49  BILITOT 0.9 0.8  PROT 8.2* 8.4*  ALBUMIN 3.5 3.3*   No results for input(s): "LIPASE", "AMYLASE" in the last 168 hours. No results for input(s): "AMMONIA" in the last 168 hours. Coagulation Profile: No results for input(s): "INR", "PROTIME" in the last 168 hours. Cardiac Enzymes: No results for input(s):  "CKTOTAL", "CKMB", "CKMBINDEX", "TROPONINI" in the last 168 hours. BNP (last 3 results) No results for input(s): "PROBNP" in the last 8760 hours. HbA1C: Recent Labs    11/03/21 0058  HGBA1C 6.2*   CBG: Recent Labs  Lab 11/04/21 1142 11/04/21 1550 11/04/21 2138 11/05/21 1015 11/05/21 1135  GLUCAP 340* 377* 451* 144* 117*   Lipid Profile: No results for input(s): "CHOL", "HDL", "LDLCALC", "TRIG", "CHOLHDL", "LDLDIRECT" in the last 72 hours. Thyroid Function Tests: No results for input(s): "TSH", "T4TOTAL", "FREET4", "T3FREE", "THYROIDAB" in the last 72 hours. Anemia Panel: No results for input(s): "VITAMINB12", "FOLATE", "FERRITIN", "TIBC", "IRON", "RETICCTPCT" in the last 72 hours. Sepsis Labs: No results for input(s): "PROCALCITON", "LATICACIDVEN" in the last 168 hours.  Recent Results (from the past 240 hour(s))  SARS Coronavirus 2 by RT PCR (hospital order, performed in [facility] hospital lab) *cepheid single result test* Anterior Nasal Swab     Status: None   Collection Time: 11/02/21 10:51 AM   Specimen: Anterior Nasal Swab  Result Value Ref Range Status   SARS Coronavirus 2 by RT PCR NEGATIVE NEGATIVE Final    Comment: (NOTE) SARS-CoV-2 target nucleic acids are NOT DETECTED.  The SARS-CoV-2 RNA is generally detectable in upper and lower respiratory specimens during the acute phase of infection. The lowest concentration of SARS-CoV-2 viral copies this assay can detect is 250 copies / mL. A negative result does not preclude SARS-CoV-2 infection and should not be used as the sole basis for treatment or other patient management decisions.  A negative result may occur with improper specimen collection / handling, submission of specimen other than nasopharyngeal swab, presence of viral mutation(s) within the areas targeted by this assay, and inadequate number of viral copies (<250 copies / mL). A negative result must be combined with clinical observations, patient  history, and epidemiological information.  Fact Sheet for Patients:   https://www.patel.info/  Fact Sheet for Healthcare Providers: https://hall.com/  This test is not yet approved or  cleared by the [place] FDA and has been authorized for detection and/or diagnosis of SARS-CoV-2 by FDA under an Emergency Use Authorization (EUA).  This EUA will remain in effect (meaning this test can be used) for the duration of the COVID-19 declaration under Section 564(b)(1) of the Act, 21 U.S.C. section 360bbb-3(b)(1), unless the authorization is terminated or revoked sooner.  Performed at [facility], [place] [address], [place],  [address]          Radiology Studies: No results found.      Scheduled Meds:  albuterol  2.5 mg Nebulization TID   budesonide (PULMICORT) nebulizer solution  0.25 mg Nebulization BID   ferrous sulfate  325 mg Oral Q breakfast   furosemide  60 mg Intravenous BID   insulin aspart  0-20 Units Subcutaneous TID WC   insulin aspart  0-5 Units Subcutaneous QHS   linagliptin  5 mg Oral Daily   megestrol  40 mg Oral BID   melatonin  3 mg Oral QHS   [START ON 11/06/2021] metFORMIN  500 mg Oral Q breakfast   predniSONE  40 mg Oral Q breakfast   Continuous Infusions:   LOS: 3 days    Time spent: 45 minutes spent on chart review, discussion with nursing staff, consultants, updating family and interview/physical exam; more than 50% of that time was spent in counseling and/or coordination of care.    Geradine Girt, DO [facility] Available via Epic secure chat 7am-7pm After these hours, please refer to coverage provider listed on [email or web address] 11/05/2021, 1:44 PM

## 2021-11-05 NOTE — Plan of Care (Signed)
  Problem: Education: Goal: Knowledge of General Education information will improve Description Including pain rating scale, medication(s)/side effects and non-pharmacologic comfort measures Outcome: Progressing   Problem: Health Behavior/Discharge Planning: Goal: Ability to manage health-related needs will improve Outcome: Progressing   

## 2021-11-05 NOTE — Progress Notes (Signed)
Mobility Specialist Progress Note:   11/05/21 1250  Mobility  Activity Stood at bedside  Level of Assistance Standby assist, set-up cues, supervision of patient - no hands on  Assistive Device None  Activity Response Tolerated poorly  $Mobility charge 1 Mobility   Pt initially agreeable to mobility + ambulatory sat test. Stood at bedside, SpO2 84% on RA. When pt informed that O2 needed to be placed, she began using profanity towards this Probation officer. Pt states she has O2 at home, and can ambulate without it. Pt refused ambulatory sat test, RN notified.   [facility] Acute Rehab Secure Chat or Office Phone: [phone]

## 2021-11-06 ENCOUNTER — Encounter (HOSPITAL_COMMUNITY): Payer: No Typology Code available for payment source

## 2021-11-06 ENCOUNTER — Other Ambulatory Visit (HOSPITAL_COMMUNITY): Payer: Self-pay

## 2021-11-06 DIAGNOSIS — J4541 Moderate persistent asthma with (acute) exacerbation: Secondary | ICD-10-CM | POA: Diagnosis not present

## 2021-11-06 LAB — GLUCOSE, CAPILLARY: Glucose-Capillary: 143 mg/dL — ABNORMAL HIGH (ref 70–99)

## 2021-11-06 MED ORDER — BUDESONIDE 0.25 MG/2ML IN SUSP
0.2500 mg | Freq: Two times a day (BID) | RESPIRATORY_TRACT | 0 refills | Status: AC
  Filled 2021-11-06: qty 60, 15d supply, fill #0

## 2021-11-06 MED ORDER — PREDNISONE 20 MG PO TABS
40.0000 mg | ORAL_TABLET | Freq: Every day | ORAL | 0 refills | Status: AC
  Filled 2021-11-06: qty 6, 3d supply, fill #0

## 2021-11-06 MED ORDER — METFORMIN HCL 500 MG PO TABS
500.0000 mg | ORAL_TABLET | Freq: Every day | ORAL | 0 refills | Status: DC
  Filled 2021-11-06: qty 30, 30d supply, fill #0

## 2021-11-06 MED ORDER — BENZONATATE 100 MG PO CAPS
100.0000 mg | ORAL_CAPSULE | Freq: Three times a day (TID) | ORAL | 0 refills | Status: DC | PRN
  Filled 2021-11-06: qty 20, 7d supply, fill #0

## 2021-11-06 MED ORDER — DOXYCYCLINE HYCLATE 100 MG PO TABS
100.0000 mg | ORAL_TABLET | Freq: Two times a day (BID) | ORAL | 0 refills | Status: DC
  Filled 2021-11-06: qty 10, 5d supply, fill #0

## 2021-11-06 MED ORDER — DOXYCYCLINE HYCLATE 100 MG PO TABS
100.0000 mg | ORAL_TABLET | Freq: Two times a day (BID) | ORAL | Status: DC
Start: 2021-11-06 — End: 2021-11-06
  Administered 2021-11-06: 100 mg via ORAL
  Filled 2021-11-06: qty 1

## 2021-11-06 NOTE — Progress Notes (Signed)
SATURATION QUALIFICATIONS: (This note is used to comply with regulatory documentation for home oxygen)  Patient Saturations on Room Air at Rest = 81%  Patient Saturations on Room Air while Ambulating = N/A%  Patient Saturations on 6 Liters of oxygen while Ambulating = 90%  Please briefly explain why patient needs home oxygen:  [place] or Office Phone: [phone]

## 2021-11-06 NOTE — Progress Notes (Signed)
Mobility Specialist Progress Note:   11/06/21 1400  Mobility  Activity Ambulated with assistance in hallway  Level of Assistance Standby assist, set-up cues, supervision of patient - no hands on  Assistive Device None  Distance Ambulated (ft) 450 ft  Activity Response Tolerated well  $Mobility charge 1 Mobility   Pt agreeable to mobility after encouragement. Required up to 6LO2 to maintain SpO2 WFL. Pt back sitting EOB with all needs met. Ambulatory sat note to follow.  Nelta Numbers Acute Rehab Secure Chat or Office Phone: [phone]

## 2021-11-06 NOTE — Discharge Summary (Signed)
Physician Discharge Summary  Renee Paul JAS:[id number] DOB: Jul 03, 1985 DOA: 11/01/2021  PCP: Medicine, [place] date: 11/01/2021 Discharge date: 11/06/2021  Admitted From: home Discharge disposition: home   Recommendations for Outpatient Follow-Up:   Resumed home O2- encouraged compliance Encourage weight loss and exercise Metformin started Referral to pulm Smoking cessation BMP 1 week   Discharge Diagnosis:   Principal Problem:   Asthma exacerbation Active Problems:   Chronic iron deficiency anemia   Acute asthma exacerbation   OSA (obstructive sleep apnea)   Chronic diastolic CHF (congestive heart failure) ([place])    Discharge Condition: Improved.  Diet recommendation: Low sodium, heart healthy.  Carbohydrate-modified.  Wound care: None.  Code status: Full.   History of Present Illness:   Renee Paul is a 36 y.o. female with history of CHF, asthma, morbid obesity and tobacco abuse was recently noted for CHF exacerbation and asthma exacerbation presents to the ER for the second time in 24 hours because of worsening shortness of breath.  Patient states since her discharge last week on August 5 patient has been living on the street not able to take her medications.  Gradually got short of breath denies any chest pain has been exam productive cough.   ED Course: In the ER patient is found to be diffusely wheezing with chest x-ray showing congestion.  Labs done yesterday was showing thrombocytopenia.  But patient did not wanted to be admitted at that time.  Repeat CBC is pending.  Patient was given nebulizer treatment steroid admitted for asthma exacerbation and also possible CHF exacerbation.     Hospital Course by Problem:   Acute asthma exacerbation  plus CHF exacerbation - steroids - change to PO -nebulizer  -Pulmicort.   - Recently was positive for rhinovirus. Flutter valve/incentive spirometry -was actually on  home O2 prior but was placed in storage-- have resume O2   Acute on chronic diastolic CHF was not taking Lasix after discharge.  Chest x-ray does show congestion.  Last 2D echo showed EF of 60 to 65%.   - resume lasix -daily weights- intermittently refusing -I/Os not being kept     Hyperglycemia/pre-DM hgbA1c; 6.2 -change diet to carb mod -refusing insulin  -add metformin- refused tradjenta and insulin -encourage lifestyle changes -multiple discussions about needs for treatment while on steroids-- patient refusing   Chronic iron deficient anemia  -continue with iron supplements   Morbid obesity Estimated body mass index is 82.64 kg/m as calculated from the following:   Height as of this encounter: '5\' 3"'$  (1.6 m).   Weight as of this encounter: 211.6 kg.     Doubt patient will be complaint with diet and medications-- counseled extensively   Medical Consultants:      Discharge Exam:   Vitals:   11/06/21 0835 11/06/21 1009  BP:  120/84  Pulse:  65  Resp:  19  Temp:  98.1 F (36.7 C)  SpO2: 96% 95%   Vitals:   11/06/21 0620 11/06/21 0832 11/06/21 0835 11/06/21 1009  BP: 130/76   120/84  Pulse: 75   65  Resp: 17   19  Temp: 97.9 F (36.6 C)   98.1 F (36.7 C)  TempSrc:    Oral  SpO2: 93% 96% 96% 95%  Weight: (!) 215.6 kg     Height:        General exam: Appears calm and comfortable.     The results of significant diagnostics from this hospitalization (  including imaging, microbiology, ancillary and laboratory) are listed below for reference.     Procedures and Diagnostic Studies:   DG Chest Portable 1 View  Result Date: 11/01/2021 CLINICAL DATA:  Wheezing, shortness of breath EXAM: PORTABLE CHEST 1 VIEW COMPARISON:  10/31/2021 FINDINGS: Cardiomegaly, vascular congestion. No overt edema. No confluent opacities or effusions. No acute bony abnormality. IMPRESSION: Cardiomegaly, vascular congestion. Electronically Signed   By: Rolm Baptise M.D.   On:  11/01/2021 22:50     Labs:   Basic Metabolic Panel: Recent Labs  Lab 10/31/21 2328 11/01/21 2255 11/01/21 2300 11/02/21 0623 11/04/21 1042  NA 139 140 141 137 137  K 3.9 4.2 4.2 4.1 4.3  CL 100 102 100 98 96*  CO2 30 28  --  29 28  GLUCOSE 95 122* 121* 312* 359*  BUN '10 16 18 16 20  '$ CREATININE 0.77 0.90 0.80 1.00 0.89  CALCIUM 8.4* 8.7*  --  8.6* 9.2  MG  --   --   --  2.1  --    GFR Estimated Creatinine Clearance: 159.2 mL/min (by C-G formula based on SCr of 0.89 mg/dL). Liver Function Tests: Recent Labs  Lab 10/31/21 2328 11/02/21 0623  AST 15 17  ALT 18 20  ALKPHOS 49 49  BILITOT 0.9 0.8  PROT 8.2* 8.4*  ALBUMIN 3.5 3.3*   No results for input(s): "LIPASE", "AMYLASE" in the last 168 hours. No results for input(s): "AMMONIA" in the last 168 hours. Coagulation profile No results for input(s): "INR", "PROTIME" in the last 168 hours.  CBC: Recent Labs  Lab 10/31/21 2328 11/01/21 2300 11/02/21 0623  WBC 8.9  --  10.3  NEUTROABS 6.8  --  9.8*  HGB 13.2 15.0 13.1  HCT 44.7 44.0 44.4  MCV 92.5  --  93.9  PLT 50*  --  326   Cardiac Enzymes: No results for input(s): "CKTOTAL", "CKMB", "CKMBINDEX", "TROPONINI" in the last 168 hours. BNP: Invalid input(s): "POCBNP" CBG: Recent Labs  Lab 11/04/21 2138 11/05/21 1015 11/05/21 1135 11/05/21 1552 11/06/21 1004  GLUCAP 451* 144* 117* 366* 143*   D-Dimer No results for input(s): "DDIMER" in the last 72 hours. Hgb A1c No results for input(s): "HGBA1C" in the last 72 hours. Lipid Profile No results for input(s): "CHOL", "HDL", "LDLCALC", "TRIG", "CHOLHDL", "LDLDIRECT" in the last 72 hours. Thyroid function studies No results for input(s): "TSH", "T4TOTAL", "T3FREE", "THYROIDAB" in the last 72 hours.  Invalid input(s): "FREET3" Anemia work up No results for input(s): "VITAMINB12", "FOLATE", "FERRITIN", "TIBC", "IRON", "RETICCTPCT" in the last 72 hours. Microbiology Recent Results (from the past 240  hour(s))  SARS Coronavirus 2 by RT PCR (hospital order, performed in [facility] hospital lab) *cepheid single result test* Anterior Nasal Swab     Status: None   Collection Time: 11/02/21 10:51 AM   Specimen: Anterior Nasal Swab  Result Value Ref Range Status   SARS Coronavirus 2 by RT PCR NEGATIVE NEGATIVE Final    Comment: (NOTE) SARS-CoV-2 target nucleic acids are NOT DETECTED.  The SARS-CoV-2 RNA is generally detectable in upper and lower respiratory specimens during the acute phase of infection. The lowest concentration of SARS-CoV-2 viral copies this assay can detect is 250 copies / mL. A negative result does not preclude SARS-CoV-2 infection and should not be used as the sole basis for treatment or other patient management decisions.  A negative result may occur with improper specimen collection / handling, submission of specimen other than nasopharyngeal swab, presence of viral  mutation(s) within the areas targeted by this assay, and inadequate number of viral copies (<250 copies / mL). A negative result must be combined with clinical observations, patient history, and epidemiological information.  Fact Sheet for Patients:   https://www.patel.info/  Fact Sheet for Healthcare Providers: https://hall.com/  This test is not yet approved or  cleared by the [place] FDA and has been authorized for detection and/or diagnosis of SARS-CoV-2 by FDA under an Emergency Use Authorization (EUA).  This EUA will remain in effect (meaning this test can be used) for the duration of the COVID-19 declaration under Section 564(b)(1) of the Act, 21 U.S.C. section 360bbb-3(b)(1), unless the authorization is terminated or revoked sooner.  Performed at [facility], [place] [address] [place], [place] [address]      Discharge Instructions:   Discharge Instructions     (HEART FAILURE PATIENTS) Call MD:  Anytime you have any of the  following symptoms: 1) 3 pound weight gain in 24 hours or 5 pounds in 1 week 2) shortness of breath, with or without a dry hacking cough 3) swelling in the hands, feet or stomach 4) if you have to sleep on extra pillows at night in order to breathe.   Complete by: As directed    Diet - low sodium heart healthy   Complete by: As directed    Diet Carb Modified   Complete by: As directed    Discharge instructions   Complete by: As directed    Stop smoking Wear O2 Slowly increase your exercise/walking Have started metformin which helps with insulin resistance   Increase activity slowly   Complete by: As directed       Allergies as of 11/06/2021   No Known Allergies      Medication List     TAKE these medications    albuterol 108 (90 Base) MCG/ACT inhaler Commonly known as: VENTOLIN HFA Inhale 2 puffs into the lungs every 4 (four) hours as needed for wheezing or shortness of breath.   benzonatate 100 MG capsule Commonly known as: TESSALON Take 1 capsule (100 mg total) by mouth 3 (three) times daily as needed for cough.   budesonide 0.25 MG/2ML nebulizer solution Commonly known as: PULMICORT Take 2 mLs (0.25 mg total) by nebulization 2 (two) times daily.   doxycycline 100 MG tablet Commonly known as: VIBRA-TABS Take 1 tablet (100 mg total) by mouth every 12 (twelve) hours.   ferrous sulfate 325 (65 FE) MG tablet Take 1 tablet (325 mg total) by mouth daily with breakfast.   furosemide 40 MG tablet Commonly known as: LASIX Take 1 tablet (40 mg total) by mouth daily.   ibuprofen 600 MG tablet Commonly known as: ADVIL Take 600 mg by mouth every 6 (six) hours as needed for mild pain.   megestrol 40 MG tablet Commonly known as: MEGACE Take 1 tablet (40 mg total) by mouth 2 (two) times daily.   metFORMIN 500 MG tablet Commonly known as: Glucophage Take 1 tablet (500 mg total) by mouth daily with breakfast. Start taking on: November 09, 2021   nicotine 14 mg/24hr  patch Commonly known as: NICODERM CQ - dosed in mg/24 hours Place 1 patch (14 mg total) onto the skin daily.   predniSONE 20 MG tablet Commonly known as: DELTASONE Take 2 tablets (40 mg total) by mouth daily with breakfast for 3 days. Start taking on: November 07, 2021   spironolactone 25 MG tablet Commonly known as: ALDACTONE Take 1 tablet (25 mg  total) by mouth daily.               Durable Medical Equipment  (From admission, onward)           Start     Ordered   11/06/21 1312  For home use only DME Nebulizer machine  Once       Question Answer Comment  Patient needs a nebulizer to treat with the following condition Asthma exacerbation   Length of Need Lifetime      11/06/21 1311            Follow-up Information     Medicine, [place] [facility] Follow up in 1 week(s).   Specialty: Family Medicine                 Time coordinating discharge: 45 min  Signed:  Geradine Girt DO  [facility] 11/06/2021, 1:54 PM

## 2021-11-06 NOTE — TOC Initial Note (Addendum)
Transition of Care [facility]) - Initial/Assessment Note    Patient Details  Name: Renee Paul MRN: [id number] Date of Birth: June 16, 1985  [facility] CM/SW Contact:    Marilu Favre, RN Phone Number: 11/06/2021, 11:31 AM  Clinical Narrative:                  Patient from home with husband.   Just moved. Updated address. Phone number same.   Patient has home oxygen through [place]. However, she states her tanks are empty and in storage and her concentrator is at her cousins address ( she was staying there prior to move).   She will have her concentrator moved to her new address.   Patient will need a portable tank at discharge . She is also requesting a smaller portable tank. Jermain with [employer] aware and will bring tank to bedside today   Jermain with [employer] will bring NEB machine also   Patient's PCP is at [place].   [facility] [address] [facility].    Patient has transportation to appointments and can get prescriptions filled.   Patient's car is at the hospital and she plans on driving herself home at discharge.  Secure chatted nurse and MD regarding all of above  Expected Discharge Plan: Home/Self Care Barriers to Discharge: Continued Medical Work up   Patient Goals and CMS Choice Patient states their goals for this hospitalization and ongoing recovery are:: to return to home      Expected Discharge Plan and Services Expected Discharge Plan: Home/Self Care   Discharge Planning Services: CM Consult   Living arrangements for the past 2 months: Single Family Home                 DME Arranged:  (see note)         HH Arranged: NA          Prior Living Arrangements/Services Living arrangements for the past 2 months: Single Family Home Lives with:: Spouse Patient language and need for interpreter reviewed:: Yes Do you feel safe going back to the place where you live?: Yes       Need for Family Participation in Patient Care: Yes (Comment) Care giver support system in place?: Yes (comment) Current home services: DME Criminal Activity/Legal Involvement Pertinent to Current Situation/Hospitalization: No - Comment as needed  Activities of Daily Living      Permission Sought/Granted   Permission granted to share information with : No              Emotional Assessment Appearance:: Appears stated age Attitude/Demeanor/Rapport: Engaged Affect (typically observed): Accepting Orientation: : Oriented to Self, Oriented to Place, Oriented to  Time, Oriented to Situation Alcohol / Substance Use: Not Applicable Psych Involvement: No (comment)  Admission diagnosis:  Hypoxia [R09.02] Moderate persistent asthma with exacerbation [J45.41] Acute respiratory failure with hypoxia (HCC) [J96.01] Asthma exacerbation [J45.901] Asthma [J45.909] Patient Active Problem List   Diagnosis Date Noted   Chronic diastolic CHF (congestive heart failure) ([place]) 11/02/2021   Asthma exacerbation 11/02/2021   Acute on chronic diastolic heart failure ([place]) 11/01/2021   Acute asthma exacerbation    Class 3 obesity (HCC)    Chronic iron deficiency anemia    Acute on chronic diastolic (congestive) heart failure ([place]) 10/22/2021   Elevated TSH 06/08/2021   Acute blood loss anemia 06/07/2021   Morbid obesity ([place])    Acute respiratory failure with hypoxia ([place]) 06/04/2021  Asthma    Acute on chronic diastolic CHF (congestive heart failure) ([place]) 03/21/2021   Abnormal uterine bleeding 03/21/2021   Symptomatic anemia 03/21/2021   Chronic respiratory failure with hypoxia and hypercapnia ([place]) 03/21/2021   OSA (obstructive sleep apnea) 03/21/2021   PCP:  Medicine, [place]:   [place], [place] [address] Phone: [phone] Fax: [phone]  [id number] [place] [facility] [address] [place]: [phone]: [phone] [facility] [address] [place]: [phone]: [phone]    Social Determinants of Health (SDOH) Interventions    Readmission Risk Interventions     No data to display

## 2021-11-06 NOTE — Progress Notes (Signed)
Pt refused, her blood sugar check stated she is not a diabetic. Will notify MD

## 2021-11-06 NOTE — Progress Notes (Signed)
RN gave patient discharge instructions and she stated understanding new medication sent to her home pharmacy. IV has been removed patient stated that she has her oxygen at home. Work note and FMLA paper given to patient copy in chart.

## 2021-11-08 ENCOUNTER — Other Ambulatory Visit: Payer: Self-pay

## 2021-11-08 ENCOUNTER — Encounter (HOSPITAL_BASED_OUTPATIENT_CLINIC_OR_DEPARTMENT_OTHER): Payer: Self-pay | Admitting: Emergency Medicine

## 2021-11-08 ENCOUNTER — Emergency Department (HOSPITAL_BASED_OUTPATIENT_CLINIC_OR_DEPARTMENT_OTHER): Payer: No Typology Code available for payment source

## 2021-11-08 ENCOUNTER — Emergency Department (HOSPITAL_BASED_OUTPATIENT_CLINIC_OR_DEPARTMENT_OTHER)
Admission: EM | Admit: 2021-11-08 | Discharge: 2021-11-08 | Discharge status: Against Medical Advice | Payer: No Typology Code available for payment source | Attending: Emergency Medicine | Admitting: Emergency Medicine

## 2021-11-08 DIAGNOSIS — Z79899 Other long term (current) drug therapy: Secondary | ICD-10-CM | POA: Insufficient documentation

## 2021-11-08 DIAGNOSIS — Z20822 Contact with and (suspected) exposure to covid-19: Secondary | ICD-10-CM | POA: Insufficient documentation

## 2021-11-08 DIAGNOSIS — Z7984 Long term (current) use of oral hypoglycemic drugs: Secondary | ICD-10-CM | POA: Insufficient documentation

## 2021-11-08 DIAGNOSIS — R0602 Shortness of breath: Secondary | ICD-10-CM | POA: Diagnosis present

## 2021-11-08 DIAGNOSIS — R600 Localized edema: Secondary | ICD-10-CM | POA: Diagnosis not present

## 2021-11-08 DIAGNOSIS — R0902 Hypoxemia: Secondary | ICD-10-CM

## 2021-11-08 DIAGNOSIS — Z7951 Long term (current) use of inhaled steroids: Secondary | ICD-10-CM | POA: Diagnosis not present

## 2021-11-08 DIAGNOSIS — J441 Chronic obstructive pulmonary disease with (acute) exacerbation: Secondary | ICD-10-CM

## 2021-11-08 DIAGNOSIS — I509 Heart failure, unspecified: Secondary | ICD-10-CM | POA: Diagnosis not present

## 2021-11-08 DIAGNOSIS — Z5900 Homelessness unspecified: Secondary | ICD-10-CM | POA: Insufficient documentation

## 2021-11-08 DIAGNOSIS — J45901 Unspecified asthma with (acute) exacerbation: Secondary | ICD-10-CM | POA: Diagnosis not present

## 2021-11-08 DIAGNOSIS — R7309 Other abnormal glucose: Secondary | ICD-10-CM | POA: Insufficient documentation

## 2021-11-08 LAB — CBC WITH DIFFERENTIAL/PLATELET
Abs Immature Granulocytes: 0.02 10*3/uL (ref 0.00–0.07)
Basophils Absolute: 0 10*3/uL (ref 0.0–0.1)
Basophils Relative: 0 %
Eosinophils Absolute: 0.2 10*3/uL (ref 0.0–0.5)
Eosinophils Relative: 2 %
HCT: 49.8 % — ABNORMAL HIGH (ref 36.0–46.0)
Hemoglobin: 14.9 g/dL (ref 12.0–15.0)
Immature Granulocytes: 0 %
Lymphocytes Relative: 27 %
Lymphs Abs: 2.3 10*3/uL (ref 0.7–4.0)
MCH: 27.7 pg (ref 26.0–34.0)
MCHC: 29.9 g/dL — ABNORMAL LOW (ref 30.0–36.0)
MCV: 92.6 fL (ref 80.0–100.0)
Monocytes Absolute: 0.4 10*3/uL (ref 0.1–1.0)
Monocytes Relative: 4 %
Neutro Abs: 5.9 10*3/uL (ref 1.7–7.7)
Neutrophils Relative %: 67 %
Platelets: 182 10*3/uL (ref 150–400)
RBC: 5.38 MIL/uL — ABNORMAL HIGH (ref 3.87–5.11)
RDW: 17.2 % — ABNORMAL HIGH (ref 11.5–15.5)
WBC: 8.8 10*3/uL (ref 4.0–10.5)
nRBC: 0 % (ref 0.0–0.2)

## 2021-11-08 LAB — COMPREHENSIVE METABOLIC PANEL
ALT: 17 U/L (ref 0–44)
AST: 12 U/L — ABNORMAL LOW (ref 15–41)
Albumin: 3.4 g/dL — ABNORMAL LOW (ref 3.5–5.0)
Alkaline Phosphatase: 47 U/L (ref 38–126)
Anion gap: 7 (ref 5–15)
BUN: 19 mg/dL (ref 6–20)
CO2: 36 mmol/L — ABNORMAL HIGH (ref 22–32)
Calcium: 8.6 mg/dL — ABNORMAL LOW (ref 8.9–10.3)
Chloride: 98 mmol/L (ref 98–111)
Creatinine, Ser: 0.92 mg/dL (ref 0.44–1.00)
GFR, Estimated: >60 mL/min (ref >60)
Glucose, Bld: 105 mg/dL — ABNORMAL HIGH (ref 70–99)
Potassium: 3.7 mmol/L (ref 3.5–5.1)
Sodium: 141 mmol/L (ref 135–145)
Total Bilirubin: 1.1 mg/dL (ref 0.3–1.2)
Total Protein: 8.1 g/dL (ref 6.5–8.1)

## 2021-11-08 LAB — BRAIN NATRIURETIC PEPTIDE: B Natriuretic Peptide: 21.2 pg/mL (ref 0.0–100.0)

## 2021-11-08 LAB — SARS CORONAVIRUS 2 BY RT PCR: SARS Coronavirus 2 by RT PCR: NEGATIVE

## 2021-11-08 MED ORDER — FUROSEMIDE 40 MG PO TABS: 40.0000 mg | ORAL_TABLET | Freq: Every day | ORAL | 0 refills | Status: DC

## 2021-11-08 MED ORDER — METHYLPREDNISOLONE SODIUM SUCC 125 MG IJ SOLR
125.0000 mg | Freq: Once | INTRAMUSCULAR | Status: AC
  Administered 2021-11-08: 125 mg via INTRAVENOUS
  Filled 2021-11-08: qty 2

## 2021-11-08 MED ORDER — IPRATROPIUM-ALBUTEROL 0.5-2.5 (3) MG/3ML IN SOLN
3.0000 mL | Freq: Once | RESPIRATORY_TRACT | Status: AC
Start: 2021-11-08 — End: 2021-11-08
  Administered 2021-11-08: 3 mL via RESPIRATORY_TRACT
  Filled 2021-11-08: qty 3

## 2021-11-08 MED ORDER — IPRATROPIUM-ALBUTEROL 0.5-2.5 (3) MG/3ML IN SOLN
3.0000 mL | RESPIRATORY_TRACT | Status: AC
  Administered 2021-11-08: 3 mL via RESPIRATORY_TRACT
  Filled 2021-11-08: qty 3

## 2021-11-08 MED ORDER — FUROSEMIDE 10 MG/ML IJ SOLN
40.0000 mg | Freq: Once | INTRAMUSCULAR | Status: AC
  Administered 2021-11-08: 40 mg via INTRAVENOUS
  Filled 2021-11-08: qty 4

## 2021-11-08 NOTE — ED Notes (Signed)
ED Provider at bedside. 

## 2021-11-08 NOTE — ED Triage Notes (Addendum)
Pt with shob. Pt DC from hospital x 2 days ago and unable to get medications. Pt states AC in apt is not working and she spent the night in her car. Pt reports oxygen tank ran out overnight.

## 2021-11-08 NOTE — ED Notes (Signed)
Pt speaking to [facility] on phone

## 2021-11-08 NOTE — ED Provider Notes (Signed)
[facility] EMERGENCY DEPARTMENT Provider Note   CSN: [id number] Arrival date & time: 11/08/21  9417     History  Chief Complaint  Patient presents with   Shortness of Breath    Renee Paul is a 36 y.o. female.  36 year old female with a history of heart failure with preserved ejection fraction and asthma on 2 L home oxygen who comes to the emergency department with shortness of breath.  Patient's had multiple presentations with shortness of breath recently.  Was hospitalized and discharged on 8/15 after being diagnosed with a heart failure and asthma exacerbation in the setting of rhinovirus.  She was discharged home with prednisone and Lasix.  Also reports being given metformin due to elevated blood sugars in the setting of steroid use.  Says that because she is out of the job she has been unable to pick up these medications and that her shortness of breath and productive cough has continued.  Denies any chest pain.  Unsure of leg swelling.  Also states that the [facility] is out in her house which has made things difficult as well.   Shortness of Breath      Home Medications Prior to Admission medications   Medication Sig Start Date End Date Taking? Authorizing Provider  albuterol (PROVENTIL HFA;VENTOLIN HFA) 108 (90 Base) MCG/ACT inhaler Inhale 2 puffs into the lungs every 4 (four) hours as needed for wheezing or shortness of breath. 09/06/16   Orpah Greek, MD  benzonatate (TESSALON) 100 MG capsule Take 1 capsule (100 mg total) by mouth 3 (three) times daily as needed for cough. 11/06/21   Eulogio Bear U, DO  budesonide (PULMICORT) 0.25 MG/2ML nebulizer solution Take 2 mLs (0.25 mg total) by nebulization 2 (two) times daily. 11/06/21   Geradine Girt, DO  doxycycline (VIBRA-TABS) 100 MG tablet Take 1 tablet (100 mg total) by mouth every 12 (twelve) hours. 11/06/21   Geradine Girt, DO  ferrous sulfate 325 (65 FE) MG tablet Take 1 tablet (325 mg total) by mouth  daily with breakfast. 06/10/21   Patrecia Pour, MD  furosemide (LASIX) 40 MG tablet Take 1 tablet (40 mg total) by mouth daily. 11/08/21   Fransico Meadow, MD  ibuprofen (ADVIL) 600 MG tablet Take 600 mg by mouth every 6 (six) hours as needed for mild pain.    [provider]  megestrol (MEGACE) 40 MG tablet Take 1 tablet (40 mg total) by mouth 2 (two) times daily. 06/10/21   Patrecia Pour, MD  metFORMIN (GLUCOPHAGE) 500 MG tablet Take 1 tablet (500 mg total) by mouth daily with breakfast. 11/09/21 11/09/22  Geradine Girt, DO  nicotine (NICODERM CQ - DOSED IN MG/24 HOURS) 14 mg/24hr patch Place 1 patch (14 mg total) onto the skin daily. Patient not taking: Reported on 11/01/2021 10/27/21   Domenic Polite, MD  predniSONE (DELTASONE) 20 MG tablet Take 2 tablets (40 mg total) by mouth daily with breakfast for 3 days. 11/07/21 11/10/21  Geradine Girt, DO  spironolactone (ALDACTONE) 25 MG tablet Take 1 tablet (25 mg total) by mouth daily. Patient not taking: Reported on 11/01/2021 10/27/21   Domenic Polite, MD      Allergies    Patient has no known allergies.    Review of Systems   Review of Systems  Respiratory:  Positive for shortness of breath.     Physical Exam Updated Vital Signs BP (!) 137/93   Pulse 94   Temp 98.6 F (37  C) (Oral)   Resp (!) 23   Ht '5\' 3"'$  (1.6 m)   Wt (!) 215.6 kg   LMP  (LMP Unknown)   SpO2 91%   BMI 84.20 kg/m  Physical Exam Vitals and nursing note reviewed.  Constitutional:      General: She is not in acute distress.    Appearance: She is well-developed. She is obese.     Comments: Speaking in full sentences.  On 2 L nasal cannula.  HENT:     Head: Normocephalic and atraumatic.     Right Ear: External ear normal.     Left Ear: External ear normal.     Nose: Nose normal.  Eyes:     Extraocular Movements: Extraocular movements intact.     Conjunctiva/sclera: Conjunctivae normal.     Pupils: Pupils are equal, round, and reactive to light.   Cardiovascular:     Rate and Rhythm: Normal rate and regular rhythm.     Heart sounds: No murmur heard. Pulmonary:     Effort: Pulmonary effort is normal. No respiratory distress.     Breath sounds: Wheezing (Diffuse expiratory mild) present.  Abdominal:     General: Abdomen is flat. There is no distension.     Palpations: Abdomen is soft. There is no mass.     Tenderness: There is no abdominal tenderness. There is no guarding.  Musculoskeletal:        General: No swelling.     Cervical back: Normal range of motion and neck supple.     Right lower leg: Edema (Trace) present.     Left lower leg: Edema (Trace) present.  Skin:    General: Skin is warm and dry.     Capillary Refill: Capillary refill takes less than 2 seconds.  Neurological:     Mental Status: She is alert and oriented to person, place, and time. Mental status is at baseline.  Psychiatric:        Mood and Affect: Mood normal.     ED Results / Procedures / Treatments   Labs (all labs ordered are listed, but only abnormal results are displayed) Labs Reviewed  CBC WITH DIFFERENTIAL/PLATELET - Abnormal; Notable for the following components:      Result Value   RBC 5.38 (*)    HCT 49.8 (*)    MCHC 29.9 (*)    RDW 17.2 (*)    All other components within normal limits  COMPREHENSIVE METABOLIC PANEL - Abnormal; Notable for the following components:   CO2 36 (*)    Glucose, Bld 105 (*)    Calcium 8.6 (*)    Albumin 3.4 (*)    AST 12 (*)    All other components within normal limits  SARS CORONAVIRUS 2 BY RT PCR  BRAIN NATRIURETIC PEPTIDE    EKG EKG Interpretation  Date/Time:  Thursday November 08 2021 06:43:14 EDT Ventricular Rate:  77 PR Interval:  155 QRS Duration: 89 QT Interval:  419 QTC Calculation: 475 R Axis:   76 Text Interpretation: Sinus rhythm Normal ECG Confirmed by Veryl Speak ([id number]) on 11/08/2021 6:47:11 AM  Radiology DG Chest Port 1 View  Result Date: 11/08/2021 CLINICAL DATA:   36 year old female with shortness of breath. Recent hospitalization. EXAM: PORTABLE CHEST 1 VIEW COMPARISON:  CTA chest 11/05/2021 and earlier. FINDINGS: Portable AP semi upright view at 0640 hours. Mildly improved lung volumes. Stable cardiac size at the upper limits of normal. Other mediastinal contours are within normal limits. Visualized tracheal air column  is within normal limits. Streaky perihilar and lung base opacity most resembles atelectasis similar to the recent CT. No pneumothorax, pleural effusion, pulmonary edema. No osseous abnormality identified. IMPRESSION: Mildly improved lung volumes. Evidence of continued perihilar and lung base atelectasis suspected similar to the recent CT. Electronically Signed   By: Genevie Ann M.D.   On: 11/08/2021 07:19    Procedures Procedures   Medications Ordered in ED Medications  ipratropium-albuterol (DUONEB) 0.5-2.5 (3) MG/3ML nebulizer solution 3 mL (3 mLs Nebulization Given 11/08/21 0622)  methylPREDNISolone sodium succinate (SOLU-MEDROL) 125 mg/2 mL injection 125 mg (125 mg Intravenous Given 11/08/21 0705)  furosemide (LASIX) injection 40 mg (40 mg Intravenous Given 11/08/21 0757)  ipratropium-albuterol (DUONEB) 0.5-2.5 (3) MG/3ML nebulizer solution 3 mL (3 mLs Nebulization Given 11/08/21 0841)    ED Course/ Medical Decision Making/ A&P Clinical Course as of 11/08/21 1940  Thu Nov 08, 2021  0800 DG Chest [place] 1 View IMPRESSION: Mildly improved lung volumes. Evidence of continued perihilar and lung base atelectasis suspected similar to the recent CT.   [RP]  0814 Verified pt does have oxygen concentrator and inhalers at home.  [RP]    Clinical Course User Index [RP] Fransico Meadow, MD                           Medical Decision Making Amount and/or Complexity of Data Reviewed Radiology:  Decision-making details documented in ED Course.  Risk Prescription drug management.   36 year old female with a history of heart failure with  preserved ejection fraction and asthma on 2 L home oxygen who comes to the emergency department with shortness of breath.   Initial DDx: Asthma exacerbation, CHFe, PNA, Viral URI   Plan:  Labs BMP COVID test Chest x-ray Solumedrol Nebs Lasix  ED Summary:  Patient underwent the above work-up which suggested that she was having a asthma exacerbation.  She improved with nebulizers in the emergency department.  Also put out approximately 800 mL of urine after being given Lasix.  Feel that heart failure was not contributing as strongly given the fact that the patient's BNP was 21.  This appears to be in the setting of medication noncompliance as the patient reports that she cannot afford her medications.  Social work was consulted and we are waiting for final recommendations from the patient was noted to desaturate into the 80s on her home 2 L while awake.  Did also have several desaturation episodes while asleep but feels this is likely due to her sleep apnea for which she wears CPAP at home.  Given the desaturations while awake was going to admit the patient but she requested to leave AMA.  After signing the [place] paperwork we called the pharmacy and prescribed her Lasix today and they state that her prednisone and doxycycline are also available along with several of her other medications for co-pay of $2.66.  Patient reported that she would be able to pay this amount.  Return precautions discussed with the patient prior to discharge and she also agreed to follow-up with her primary doctor in 2 to 3 days.  Consults: - SW   Records reviewed previous admission documents  The following labs were independently interpreted: BNP not suggestive of a heart failure exacerbation  I independently visualized the following imaging with scope of interpretation limited to determining acute life threatening conditions related to emergency care: CXR, which revealed no acute changes from prior or  infiltrates  Social  Determinants of health:  Difficulty affording medications.  Homelessness.  Final Clinical Impression(s) / ED Diagnoses Final diagnoses:  COPD exacerbation ([place])  Hypoxia  Acute on chronic congestive heart failure, unspecified heart failure type [facility])    Rx / DC Orders ED Discharge Orders          Ordered    furosemide (LASIX) 40 MG tablet  Daily        11/08/21 1042              Fransico Meadow, MD 11/08/21 1940

## 2021-11-08 NOTE — ED Notes (Addendum)
Noted patients 02 saturations dropped while she was asleep,  70-80%.  Sats increased to normal once pt stirred awake.  Resp to switch to high floor o2.  MD made aware

## 2021-11-08 NOTE — ED Notes (Signed)
Renee Paul's O2 sats on 6L are dropping into the 60s.  She is noncompliant with her CPAP and says that she can not stand to wear the mask at home.  I will try HFNC on her to see if it will hold her sats above 90% while sleeping.  She is currently on the phone with the social worker.

## 2021-11-08 NOTE — Progress Notes (Addendum)
This CSW spoke with CPS Caseworker, Albertine Patricia, and informed her of information shared by the pt earlier this morning. The CPS Caseworker is going to contact the patient and see if they can pay for her a hotel room until the A/C is fixed in the apartment. Joaquim Lai shared that she has been in contact with the [facility].   CSW informed RN's that Jermain at [facility] is sending someone to deliver portable tank to the pt before she is discharged.   Ulysees Barns, MSW, [email or web address]

## 2021-11-08 NOTE — ED Notes (Signed)
Informed that a portable oxygen tank would be delivered here before she signed out AMA. States she had to leave to get her apartment deposit back and could not wait here any longer. Accompanied by children

## 2021-11-08 NOTE — Progress Notes (Signed)
This CSW contacted Jermaine @ [facility] back to inform of possible admission into hospital. [facility] is stopping delivery of portable tank at this time. Marva, RN, informed that she would provide this CSW a definite answer after speaking with pt. TOC following.

## 2021-11-08 NOTE — Discharge Instructions (Signed)
Today you were seen in the emergency department for your shortness of breath.    In the emergency department you were give lasix, nebulizers, and prednisone.    At home, please continue these medications and the ones you were prescribed.    Follow-up with your primary doctor in 2-3 days regarding your visit.   Return immediately to the emergency department if you experience any of the following: difficulty breathing, pain, or any other concerning symptoms.    Thank you for visiting our Emergency Department. It was a pleasure taking care of you today.

## 2021-11-08 NOTE — Progress Notes (Addendum)
TOC contacted the pt to receive more information about current living situation. The pt expressed that she and her 38 y/o daughter stayed in her car last night. She informed this CSW that she also has a 77 y/o daughter who has been staying with her Father in [place]. The pt also shared that her 30 y/o daughter is staying with her boyfriend. This CSW asked the pt if she and her children have a place to sleep tonight. The pt responded "I want to say yes but I just don't know. The hospital has already called CPS."   This CSW inquired about where her husband is staying and if he is able to assist with her medications. The pt informed that the husband is staying with his father and that he is unable to assist her with her medications. She shared "if he were able, I would not even be in the hospital right now." This CSW informed the pt that we do not have any programs to assist her with medications at this time, due to her having insurance.   The pt became emotional and shared that she has no money and have not been officially able to move into her apartment yet due to there being no A/C. This CSW encouraged the pt to contact LE to see if they are able to go with her to get her concentrator and contact the landlord to follow up on the status of the A/C.  '@9'$ :50 AM This CSW contacted CPS to speak with the pt's current case worker and inform her of situation and information shared today. This CSW left a voicemail and will try to contact the caseworker back. CPS Caseworker, Albertine Patricia, [phone]).  Addend '@9'$ :36 AM Jermaine from [facility] [phone]) contacted this CSW back and confirmed that the pt is needing to contact LE to get her concentrator back from the cousins house. Jermaine shared that they will provide her a portable oxygen tank to the ED before d/c.   Addend '@10'$ :10 AM This CSW contacted Marva, RN, to update her on TOC progress with pt.

## 2021-12-06 ENCOUNTER — Emergency Department (HOSPITAL_BASED_OUTPATIENT_CLINIC_OR_DEPARTMENT_OTHER): Payer: No Typology Code available for payment source

## 2021-12-06 ENCOUNTER — Encounter (HOSPITAL_COMMUNITY): Payer: Self-pay

## 2021-12-06 ENCOUNTER — Inpatient Hospital Stay (HOSPITAL_BASED_OUTPATIENT_CLINIC_OR_DEPARTMENT_OTHER)
Admission: EM | Admit: 2021-12-06 | Discharge: 2021-12-08 | DRG: 189 | Disposition: A | Discharge status: Home / Self Care | Payer: No Typology Code available for payment source | Attending: Internal Medicine | Admitting: Internal Medicine

## 2021-12-06 ENCOUNTER — Other Ambulatory Visit: Payer: Self-pay

## 2021-12-06 ENCOUNTER — Encounter (HOSPITAL_BASED_OUTPATIENT_CLINIC_OR_DEPARTMENT_OTHER): Payer: Self-pay | Admitting: Emergency Medicine

## 2021-12-06 DIAGNOSIS — R55 Syncope and collapse: Secondary | ICD-10-CM | POA: Diagnosis present

## 2021-12-06 DIAGNOSIS — G4733 Obstructive sleep apnea (adult) (pediatric): Secondary | ICD-10-CM | POA: Diagnosis present

## 2021-12-06 DIAGNOSIS — J9601 Acute respiratory failure with hypoxia: Principal | ICD-10-CM

## 2021-12-06 DIAGNOSIS — Z91199 Patient's noncompliance with other medical treatment and regimen due to unspecified reason: Secondary | ICD-10-CM

## 2021-12-06 DIAGNOSIS — J9811 Atelectasis: Secondary | ICD-10-CM | POA: Diagnosis present

## 2021-12-06 DIAGNOSIS — Z7951 Long term (current) use of inhaled steroids: Secondary | ICD-10-CM

## 2021-12-06 DIAGNOSIS — J9621 Acute and chronic respiratory failure with hypoxia: Secondary | ICD-10-CM | POA: Diagnosis not present

## 2021-12-06 DIAGNOSIS — J9611 Chronic respiratory failure with hypoxia: Secondary | ICD-10-CM

## 2021-12-06 DIAGNOSIS — Z7984 Long term (current) use of oral hypoglycemic drugs: Secondary | ICD-10-CM

## 2021-12-06 DIAGNOSIS — Z9981 Dependence on supplemental oxygen: Secondary | ICD-10-CM

## 2021-12-06 DIAGNOSIS — Z9049 Acquired absence of other specified parts of digestive tract: Secondary | ICD-10-CM

## 2021-12-06 DIAGNOSIS — N179 Acute kidney failure, unspecified: Secondary | ICD-10-CM | POA: Diagnosis not present

## 2021-12-06 DIAGNOSIS — J9622 Acute and chronic respiratory failure with hypercapnia: Secondary | ICD-10-CM | POA: Diagnosis present

## 2021-12-06 DIAGNOSIS — I5032 Chronic diastolic (congestive) heart failure: Secondary | ICD-10-CM | POA: Diagnosis not present

## 2021-12-06 DIAGNOSIS — J9612 Chronic respiratory failure with hypercapnia: Secondary | ICD-10-CM

## 2021-12-06 DIAGNOSIS — Z6841 Body Mass Index (BMI) 40.0 and over, adult: Secondary | ICD-10-CM

## 2021-12-06 DIAGNOSIS — J9620 Acute and chronic respiratory failure, unspecified whether with hypoxia or hypercapnia: Secondary | ICD-10-CM | POA: Diagnosis present

## 2021-12-06 DIAGNOSIS — F17210 Nicotine dependence, cigarettes, uncomplicated: Secondary | ICD-10-CM | POA: Diagnosis present

## 2021-12-06 DIAGNOSIS — Z20822 Contact with and (suspected) exposure to covid-19: Secondary | ICD-10-CM | POA: Diagnosis present

## 2021-12-06 DIAGNOSIS — F40240 Claustrophobia: Secondary | ICD-10-CM | POA: Diagnosis present

## 2021-12-06 DIAGNOSIS — Z79899 Other long term (current) drug therapy: Secondary | ICD-10-CM

## 2021-12-06 DIAGNOSIS — E662 Morbid (severe) obesity with alveolar hypoventilation: Secondary | ICD-10-CM | POA: Diagnosis present

## 2021-12-06 LAB — CBC WITH DIFFERENTIAL/PLATELET
Abs Immature Granulocytes: 0.03 10*3/uL (ref 0.00–0.07)
Basophils Absolute: 0 10*3/uL (ref 0.0–0.1)
Basophils Relative: 0 %
Eosinophils Absolute: 0.1 10*3/uL (ref 0.0–0.5)
Eosinophils Relative: 1 %
HCT: 43 % (ref 36.0–46.0)
Hemoglobin: 12.8 g/dL (ref 12.0–15.0)
Immature Granulocytes: 1 %
Lymphocytes Relative: 18 %
Lymphs Abs: 1.2 10*3/uL (ref 0.7–4.0)
MCH: 27.9 pg (ref 26.0–34.0)
MCHC: 29.8 g/dL — ABNORMAL LOW (ref 30.0–36.0)
MCV: 93.9 fL (ref 80.0–100.0)
Monocytes Absolute: 0.3 10*3/uL (ref 0.1–1.0)
Monocytes Relative: 5 %
Neutro Abs: 4.9 10*3/uL (ref 1.7–7.7)
Neutrophils Relative %: 75 %
Platelets: 148 10*3/uL — ABNORMAL LOW (ref 150–400)
RBC: 4.58 MIL/uL (ref 3.87–5.11)
RDW: 17 % — ABNORMAL HIGH (ref 11.5–15.5)
WBC: 6.6 10*3/uL (ref 4.0–10.5)
nRBC: 0.3 % — ABNORMAL HIGH (ref 0.0–0.2)

## 2021-12-06 LAB — BRAIN NATRIURETIC PEPTIDE: B Natriuretic Peptide: 80.2 pg/mL (ref 0.0–100.0)

## 2021-12-06 LAB — I-STAT VENOUS BLOOD GAS, ED
Acid-Base Excess: 12 mmol/L — ABNORMAL HIGH (ref 0.0–2.0)
Bicarbonate: 36.3 mmol/L — ABNORMAL HIGH (ref 20.0–28.0)
Calcium, Ion: 0.96 mmol/L — ABNORMAL LOW (ref 1.15–1.40)
HCT: 42 % (ref 36.0–46.0)
Hemoglobin: 14.3 g/dL (ref 12.0–15.0)
O2 Saturation: 95 %
Potassium: 3.8 mmol/L (ref 3.5–5.1)
Sodium: 138 mmol/L (ref 135–145)
TCO2: 38 mmol/L — ABNORMAL HIGH (ref 22–32)
pCO2, Ven: 45.2 mmHg (ref 44–60)
pH, Ven: 7.512 — ABNORMAL HIGH (ref 7.25–7.43)
pO2, Ven: 69 mmHg — ABNORMAL HIGH (ref 32–45)

## 2021-12-06 LAB — BASIC METABOLIC PANEL
Anion gap: 8 (ref 5–15)
BUN: 9 mg/dL (ref 6–20)
CO2: 29 mmol/L (ref 22–32)
Calcium: 8.9 mg/dL (ref 8.9–10.3)
Chloride: 99 mmol/L (ref 98–111)
Creatinine, Ser: 1.57 mg/dL — ABNORMAL HIGH (ref 0.44–1.00)
GFR, Estimated: 43 mL/min — ABNORMAL LOW (ref >60)
Glucose, Bld: 90 mg/dL (ref 70–99)
Potassium: 3.7 mmol/L (ref 3.5–5.1)
Sodium: 136 mmol/L (ref 135–145)

## 2021-12-06 LAB — RESP PANEL BY RT-PCR (FLU A&B, COVID) ARPGX2
Influenza A by PCR: NEGATIVE
Influenza B by PCR: NEGATIVE
SARS Coronavirus 2 by RT PCR: NEGATIVE

## 2021-12-06 LAB — TROPONIN I (HIGH SENSITIVITY)
Troponin I (High Sensitivity): 17 ng/L (ref <18)
Troponin I (High Sensitivity): 16 ng/L (ref <18)

## 2021-12-06 MED ORDER — ACETAMINOPHEN 325 MG PO TABS: 650.0000 mg | ORAL_TABLET | Freq: Four times a day (QID) | ORAL | Status: DC | PRN

## 2021-12-06 MED ORDER — METFORMIN HCL 500 MG PO TABS: 500.0000 mg | ORAL_TABLET | Freq: Every day | ORAL | Status: DC

## 2021-12-06 MED ORDER — METHYLPREDNISOLONE SODIUM SUCC 125 MG IJ SOLR
125.0000 mg | Freq: Once | INTRAMUSCULAR | Status: AC
  Administered 2021-12-06: 125 mg via INTRAVENOUS
  Filled 2021-12-06: qty 2

## 2021-12-06 MED ORDER — POTASSIUM CHLORIDE CRYS ER 20 MEQ PO TBCR
40.0000 meq | EXTENDED_RELEASE_TABLET | Freq: Once | ORAL | Status: AC
  Administered 2021-12-06: 40 meq via ORAL
  Filled 2021-12-06: qty 2

## 2021-12-06 MED ORDER — ONDANSETRON HCL 4 MG PO TABS: 4.0000 mg | ORAL_TABLET | Freq: Four times a day (QID) | ORAL | Status: DC | PRN

## 2021-12-06 MED ORDER — ONDANSETRON HCL 4 MG/2ML IJ SOLN: 4.0000 mg | Freq: Four times a day (QID) | INTRAMUSCULAR | Status: DC | PRN

## 2021-12-06 MED ORDER — ACETAMINOPHEN 650 MG RE SUPP: 650.0000 mg | Freq: Four times a day (QID) | RECTAL | Status: DC | PRN

## 2021-12-06 MED ORDER — MAGNESIUM SULFATE 2 GM/50ML IV SOLN
2.0000 g | Freq: Once | INTRAVENOUS | Status: AC
  Administered 2021-12-06: 2 g via INTRAVENOUS
  Filled 2021-12-06: qty 50

## 2021-12-06 MED ORDER — BUDESONIDE 0.5 MG/2ML IN SUSP
0.2500 mg | Freq: Two times a day (BID) | RESPIRATORY_TRACT | Status: DC
  Administered 2021-12-06 – 2021-12-08 (×3): 0.25 mg via RESPIRATORY_TRACT
  Filled 2021-12-06 (×4): qty 2

## 2021-12-06 MED ORDER — AMITRIPTYLINE HCL 50 MG PO TABS
100.0000 mg | ORAL_TABLET | Freq: Every day | ORAL | Status: DC
  Administered 2021-12-06 – 2021-12-07 (×2): 100 mg via ORAL
  Filled 2021-12-06 (×2): qty 2

## 2021-12-06 MED ORDER — HEPARIN SODIUM (PORCINE) 5000 UNIT/ML IJ SOLN
5000.0000 [IU] | Freq: Three times a day (TID) | INTRAMUSCULAR | Status: DC
  Administered 2021-12-06 – 2021-12-08 (×4): 5000 [IU] via SUBCUTANEOUS
  Filled 2021-12-06 (×5): qty 1

## 2021-12-06 MED ORDER — IPRATROPIUM-ALBUTEROL 0.5-2.5 (3) MG/3ML IN SOLN
3.0000 mL | Freq: Once | RESPIRATORY_TRACT | Status: AC
  Administered 2021-12-06: 3 mL via RESPIRATORY_TRACT
  Filled 2021-12-06: qty 3

## 2021-12-06 MED ORDER — FUROSEMIDE 10 MG/ML IJ SOLN
40.0000 mg | Freq: Once | INTRAMUSCULAR | Status: AC
  Administered 2021-12-06: 40 mg via INTRAVENOUS
  Filled 2021-12-06: qty 4

## 2021-12-06 NOTE — Assessment & Plan Note (Signed)
Mild AKI due to patient's use of Lasix.  Patient will need to hold off on twice a day Lasix.  Discussed with the patient that she should not be using these medications without supervision.

## 2021-12-06 NOTE — Assessment & Plan Note (Signed)
Patient is euvolemic.  BNP is normal.

## 2021-12-06 NOTE — ED Triage Notes (Signed)
Pt arrived POV, brought in by father in law, complaints of SOB x 4 days, worse when laying down. Endorses LE swelling on lasix- missed for 2 days, chest pain when laying flat. On 2LNC PRN at home, was on 3L at night, not on O2 en route to ED. H/O Asthma, CHF, Smoker- taking 1 cigarette a week.   SPO2 60% on arrival

## 2021-12-06 NOTE — ED Notes (Signed)
Increased to [facility]. Sleeping

## 2021-12-06 NOTE — Subjective & Objective (Signed)
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  Patient states that she had walked in from the parking lot.  She had forgotten her badge.  She had to go back out to the parking lot to get her badge.  She was walking quickly.  She does not use oxygen normally during the day.  She states that by the time she got to her car she was extremely short winded.  She thinks she passed out.  She did not hit her head.  She was embarrassed that she passed out due to walking so fast.  She declined EMS transport.  She brought herself to the ER.  Upon further review, patient recently left AMA from [facility] on 12/05/2021.  In their discharge summary, they note the patient has chronic hypercapnia with a baseline PCO2 of 75 mmHg.  Patient was placed on a trilogy noninvasive mechanical ventilator during her hospital stay.  Since the patient left AMA, BiPAP machine was not ordered for her.  Patient states that she does have a CPAP machine at home.  She has various masks including a fullface mask, nasal pillows and a standard nose/mouth mask.  She states that she does not like to wear this.  She states that it makes her feel claustrophobic.  Work-up in the ER showed a BNP was normal at 80.2  BUN of 9, creatinine 1.57.  Patient states that she doubled up on her Lasix because of some leg swelling.  COVID test was negative.  White count 6.6, hemoglobin 12.8, platelets 148  Initial O2 sat in the ER was 67%.  Patient started on 6 L and titrated down to 4 L.  Patient transferred to [facility] for further care.  Of note, the patient states that she has always been very heavy even as a child.  She has gained about  150 pounds since the birth of her 3 children.  She is steadily gaining weight every year.  She is now at 473 pounds.  She has never thought about surgical weight loss reduction.

## 2021-12-06 NOTE — ED Notes (Signed)
RT called to bedside due to SAT 68%. When I arrived, she had been placed on 6L. SAT now 95%. RT to monitor.

## 2021-12-06 NOTE — Progress Notes (Signed)
Dr. Bridgett Larsson at the bedside.

## 2021-12-06 NOTE — H&P (Signed)
History and Physical    Renee Paul WEX:[id number] DOB: 11/05/1985 DOA: 12/06/2021  DOS: the patient was seen and examined on 12/06/2021  PCP: [facility], [facility]   Patient coming from: Home  I have personally briefly reviewed patient's old medical records in [place]  CC: SOB, syncope HPI: 36 year old African-American female history of super morbid obesity BMI of 81.53 (weight of 215 kg), history of chronic diastolic heart failure, chronic hypoxic and hypercapnic respiratory failure on home oxygen at night at 2 L a minute, noncompliance with CPAP presents to the ER today with a syncopal episode at work.  Patient states that she had walked in from the parking lot.  She had forgotten her badge.  She had to go back out to the parking lot to get her badge.  She was walking quickly.  She does not use oxygen normally during the day.  She states that by the time she got to her car she was extremely short winded.  She thinks she passed out.  She did not hit her head.  She was embarrassed that she passed out due to walking so fast.  She declined EMS transport.  She brought herself to the ER.  Upon further review, patient recently left AMA from [facility] on 12/05/2021.  In their discharge summary, they note the patient has chronic hypercapnia with a baseline PCO2 of 75 mmHg.  Patient was placed on a trilogy noninvasive mechanical ventilator during her hospital stay.  Since the patient left AMA, BiPAP machine was not ordered for her.  Patient states that she does have a CPAP machine at home.  She has various masks including a fullface mask, nasal pillows and a standard nose/mouth mask.  She states that she does not like to wear this.  She states that it makes her feel claustrophobic.  Work-up in the ER showed a BNP was normal at 80.2  BUN of 9, creatinine 1.57.  Patient states that she doubled up on her Lasix because of some leg swelling.  COVID  test was negative.  White count 6.6, hemoglobin 12.8, platelets 148  Initial O2 sat in the ER was 67%.  Patient started on 6 L and titrated down to 4 L.  Patient transferred to [facility] for further care.  Of note, the patient states that she has always been very heavy even as a child.  She has gained about 150 pounds since the birth of her 3 children.  She is steadily gaining weight every year.  She is now at 473 pounds.  She has never thought about surgical weight loss reduction.   ED Course: BNP normal, AKI on CMP  Review of Systems:  Review of Systems  Constitutional: Negative.   HENT: Negative.    Eyes: Negative.   Respiratory:  Positive for shortness of breath.   Cardiovascular:  Positive for orthopnea and leg swelling.  Gastrointestinal: Negative.   Genitourinary: Negative.   Musculoskeletal: Negative.   Skin: Negative.   Neurological: Negative.   Endo/Heme/Allergies: Negative.   Psychiatric/Behavioral:  The patient has insomnia.   All other systems reviewed and are negative.   Past Medical History:  Diagnosis Date   Asthma    CHF (congestive heart failure) ([place])    Morbid obesity ([place])     Past Surgical History:  Procedure Laterality Date   CESAREAN SECTION     CESAREAN SECTION     CHOLECYSTECTOMY     INTRAUTERINE DEVICE INSERTION  IUD REMOVAL     TUBAL LIGATION       reports that she has been smoking cigarettes. She has a 9.00 pack-year smoking history. She has never used smokeless tobacco. She reports current alcohol use of about 8.0 standard drinks of alcohol per week. She reports that she does not use drugs.  No Known Allergies  History reviewed. No pertinent family history.  Prior to Admission medications   Medication Sig Start Date End Date Taking? Authorizing Provider  albuterol (PROVENTIL HFA;VENTOLIN HFA) 108 (90 Base) MCG/ACT inhaler Inhale 2 puffs into the lungs every 4 (four) hours as needed for wheezing or shortness of breath. 09/06/16    Orpah Greek, MD  benzonatate (TESSALON) 100 MG capsule Take 1 capsule (100 mg total) by mouth 3 (three) times daily as needed for cough. 11/06/21   Eulogio Bear U, DO  budesonide (PULMICORT) 0.25 MG/2ML nebulizer solution Take 2 mLs (0.25 mg total) by nebulization 2 (two) times daily. 11/06/21   Geradine Girt, DO  doxycycline (VIBRA-TABS) 100 MG tablet Take 1 tablet (100 mg total) by mouth every 12 (twelve) hours. 11/06/21   Geradine Girt, DO  ferrous sulfate 325 (65 FE) MG tablet Take 1 tablet (325 mg total) by mouth daily with breakfast. 06/10/21   Patrecia Pour, MD  furosemide (LASIX) 40 MG tablet Take 1 tablet (40 mg total) by mouth daily. 11/08/21   Fransico Meadow, MD  ibuprofen (ADVIL) 600 MG tablet Take 600 mg by mouth every 6 (six) hours as needed for mild pain.    [provider]  megestrol (MEGACE) 40 MG tablet Take 1 tablet (40 mg total) by mouth 2 (two) times daily. 06/10/21   Patrecia Pour, MD  metFORMIN (GLUCOPHAGE) 500 MG tablet Take 1 tablet (500 mg total) by mouth daily with breakfast. 11/09/21 11/09/22  Geradine Girt, DO  nicotine (NICODERM CQ - DOSED IN MG/24 HOURS) 14 mg/24hr patch Place 1 patch (14 mg total) onto the skin daily. Patient not taking: Reported on 11/01/2021 10/27/21   Domenic Polite, MD  spironolactone (ALDACTONE) 25 MG tablet Take 1 tablet (25 mg total) by mouth daily. Patient not taking: Reported on 11/01/2021 10/27/21   Domenic Polite, MD    Physical Exam: Vitals:   12/06/21 1800 12/06/21 1900 12/06/21 1930 12/06/21 2130  BP: 136/88 (!) 143/97  132/89  Pulse: 87 86 81 81  Resp: (!) 27 (!) '21 20 18  '$ Temp:      TempSrc:      SpO2: 95% 91% 94% 94%  Weight:      Height:        Physical Exam Vitals and nursing note reviewed.  Constitutional:      General: She is not in acute distress.    Appearance: Normal appearance. She is obese. She is not ill-appearing, toxic-appearing or diaphoretic.  HENT:     Head: Normocephalic and  atraumatic.     Nose: Nose normal.  Eyes:     General: No scleral icterus. Cardiovascular:     Rate and Rhythm: Normal rate and regular rhythm.     Pulses: Normal pulses.  Pulmonary:     Effort: Pulmonary effort is normal. No respiratory distress.     Breath sounds: No wheezing or rales.  Abdominal:     General: Bowel sounds are normal. There is no distension.     Tenderness: There is no abdominal tenderness. There is no guarding or rebound.  Musculoskeletal:     Right  lower leg: No edema.     Left lower leg: No edema.  Skin:    General: Skin is warm and dry.     Capillary Refill: Capillary refill takes less than 2 seconds.  Neurological:     General: No focal deficit present.     Mental Status: She is alert and oriented to person, place, and time.      Labs on Admission: I have personally reviewed following labs and imaging studies  CBC: Recent Labs  Lab 12/06/21 1358 12/06/21 1425  WBC 6.6  --   NEUTROABS 4.9  --   HGB 12.8 14.3  HCT 43.0 42.0  MCV 93.9  --   PLT 148*  --    Basic Metabolic Panel: Recent Labs  Lab 12/06/21 1358 12/06/21 1425  NA 136 138  K 3.7 3.8  CL 99  --   CO2 29  --   GLUCOSE 90  --   BUN 9  --   CREATININE 1.57*  --   CALCIUM 8.9  --    GFR: Estimated Creatinine Clearance: 91.3 mL/min (A) (by C-G formula based on SCr of 1.57 mg/dL (H)). Liver Function Tests: No results for input(s): "AST", "ALT", "ALKPHOS", "BILITOT", "PROT", "ALBUMIN" in the last 168 hours. No results for input(s): "LIPASE", "AMYLASE" in the last 168 hours. No results for input(s): "AMMONIA" in the last 168 hours. Coagulation Profile: No results for input(s): "INR", "PROTIME" in the last 168 hours. Cardiac Enzymes: Recent Labs  Lab 12/06/21 1317 12/06/21 1410  TROPONINIHS 17 16   BNP (last 3 results) No results for input(s): "PROBNP" in the last 8760 hours. HbA1C: No results for input(s): "HGBA1C" in the last 72 hours. CBG: No results for input(s):  "GLUCAP" in the last 168 hours. Lipid Profile: No results for input(s): "CHOL", "HDL", "LDLCALC", "TRIG", "CHOLHDL", "LDLDIRECT" in the last 72 hours. Thyroid Function Tests: No results for input(s): "TSH", "T4TOTAL", "FREET4", "T3FREE", "THYROIDAB" in the last 72 hours. Anemia Panel: No results for input(s): "VITAMINB12", "FOLATE", "FERRITIN", "TIBC", "IRON", "RETICCTPCT" in the last 72 hours. Urine analysis:    Component Value Date/Time   COLORURINE YELLOW 06/05/2021 0416   APPEARANCEUR CLEAR 06/05/2021 0416   LABSPEC 1.014 06/05/2021 0416   PHURINE 7.0 06/05/2021 0416   GLUCOSEU NEGATIVE 06/05/2021 0416   HGBUR LARGE (A) 06/05/2021 0416   BILIRUBINUR NEGATIVE 06/05/2021 0416   KETONESUR NEGATIVE 06/05/2021 0416   PROTEINUR NEGATIVE 06/05/2021 0416   NITRITE NEGATIVE 06/05/2021 0416   LEUKOCYTESUR NEGATIVE 06/05/2021 0416    Radiological Exams on Admission: I have personally reviewed images DG Chest Port 1 View  Result Date: 12/06/2021 CLINICAL DATA:  Shortness of breath. EXAM: PORTABLE CHEST 1 VIEW COMPARISON:  December 04, 2021 FINDINGS: Enlarged cardiac silhouette. Similar in appearance mixed pattern pulmonary edema and/or atelectasis. IMPRESSION: Similar in appearance mixed pattern pulmonary edema and/or atelectasis. Electronically Signed   By: Fidela Salisbury M.D.   On: 12/06/2021 13:43    EKG: My personal interpretation of EKG shows: NSR    Assessment/Plan Principal Problem:   Acute on chronic respiratory failure with hypoxia (HCC) Active Problems:   Chronic respiratory failure with hypoxia and hypercapnia (HCC)   AKI (acute kidney injury) (HCC)   OSA (obstructive sleep apnea)   Chronic diastolic CHF (congestive heart failure) (HCC)    Assessment and Plan: * Acute on chronic respiratory failure with hypoxia (HCC) Observation medical bed.  Check an echo due to her syncope today from walking but I suspect this is probably  secondary to her being out of shape and  breathing too fast for the amount of exertion she put in to walking to her car.  Patient clearly has obesity hypoventilation syndrome.  She will need to use CPAP at home.  We will try giving her some Elavil at home to help with anxiety and for some sedation so she can tolerate the CPAP mask.  She has no wheezing.  Will not continue steroids.  AKI (acute kidney injury) ([place]) Mild AKI due to patient's use of Lasix.  Patient will need to hold off on twice a day Lasix.  Discussed with the patient that she should not be using these medications without supervision.  Chronic respiratory failure with hypoxia and hypercapnia (HCC) Continue with oxygen to keep O2 saturations 88 to 93%.  She will need nighttime ventilation.  We will try with CPAP alone.  She may need BiPAP.  Also discussed that if she is not able to tolerate a face mask for ventilation, we may need to consider tracheostomy given her hypoventilation syndrome.  Chronic diastolic CHF (congestive heart failure) ([place]) Patient is euvolemic.  BNP is normal.    OSA (obstructive sleep apnea) Continue with CPAP at night.    DVT prophylaxis: SQ Heparin Code Status: Full Code Family Communication: no family at bedside  Disposition Plan: return home  Consults called: none  Admission status: Observation, Telemetry bed   Kristopher Oppenheim, DO [facility] 12/06/2021, 10:12 PM

## 2021-12-06 NOTE — ED Notes (Signed)
Pt hard stick, 3 Rns attempted. Only able to send 1 blood culture set.

## 2021-12-06 NOTE — Assessment & Plan Note (Signed)
Continue with oxygen to keep O2 saturations 88 to 93%.  She will need nighttime ventilation.  We will try with CPAP alone.  She may need BiPAP.  Also discussed that if she is not able to tolerate a face mask for ventilation, we may need to consider tracheostomy given her hypoventilation syndrome.

## 2021-12-06 NOTE — ED Provider Notes (Signed)
[facility] EMERGENCY DEPARTMENT Provider Note   CSN: [id number] Arrival date & time: 12/06/21  1253     History  Chief Complaint  Patient presents with   Shortness of Breath    Renee Paul is a 36 y.o. female.  She has a history of CHF sleep apnea respiratory failure asthma and is a smoker.  She is complaining of shortness of breath its been going on for a few weeks.  She just left [facility] AMA after being admitted for shortness of breath.  She said she was walking to her car to get her badge today for work when she had a syncopal event.  She has had a cough nonproductive no fevers no chest pain.  She denies any injury.  She said she started using her oxygen again last evening after not having access to it for a few weeks.  The history is provided by the patient.  Shortness of Breath Severity:  Severe Onset quality:  Gradual Timing:  Constant Progression:  Worsening Chronicity:  Recurrent Relieved by:  Nothing Worsened by:  Activity Ineffective treatments:  Rest and oxygen Associated symptoms: cough   Associated symptoms: no abdominal pain, no chest pain, no fever, no hemoptysis, no sputum production and no wheezing   Risk factors: tobacco use        Home Medications Prior to Admission medications   Medication Sig Start Date End Date Taking? Authorizing Provider  albuterol (PROVENTIL HFA;VENTOLIN HFA) 108 (90 Base) MCG/ACT inhaler Inhale 2 puffs into the lungs every 4 (four) hours as needed for wheezing or shortness of breath. 09/06/16   Orpah Greek, MD  benzonatate (TESSALON) 100 MG capsule Take 1 capsule (100 mg total) by mouth 3 (three) times daily as needed for cough. 11/06/21   Eulogio Bear U, DO  budesonide (PULMICORT) 0.25 MG/2ML nebulizer solution Take 2 mLs (0.25 mg total) by nebulization 2 (two) times daily. 11/06/21   Geradine Girt, DO  doxycycline (VIBRA-TABS) 100 MG tablet Take 1 tablet (100 mg total) by mouth every 12 (twelve)  hours. 11/06/21   Geradine Girt, DO  ferrous sulfate 325 (65 FE) MG tablet Take 1 tablet (325 mg total) by mouth daily with breakfast. 06/10/21   Patrecia Pour, MD  furosemide (LASIX) 40 MG tablet Take 1 tablet (40 mg total) by mouth daily. 11/08/21   Fransico Meadow, MD  ibuprofen (ADVIL) 600 MG tablet Take 600 mg by mouth every 6 (six) hours as needed for mild pain.    [provider]  megestrol (MEGACE) 40 MG tablet Take 1 tablet (40 mg total) by mouth 2 (two) times daily. 06/10/21   Patrecia Pour, MD  metFORMIN (GLUCOPHAGE) 500 MG tablet Take 1 tablet (500 mg total) by mouth daily with breakfast. 11/09/21 11/09/22  Geradine Girt, DO  nicotine (NICODERM CQ - DOSED IN MG/24 HOURS) 14 mg/24hr patch Place 1 patch (14 mg total) onto the skin daily. Patient not taking: Reported on 11/01/2021 10/27/21   Domenic Polite, MD  spironolactone (ALDACTONE) 25 MG tablet Take 1 tablet (25 mg total) by mouth daily. Patient not taking: Reported on 11/01/2021 10/27/21   Domenic Polite, MD      Allergies    Patient has no known allergies.    Review of Systems   Review of Systems  Constitutional:  Negative for fever.  Eyes:  Negative for visual disturbance.  Respiratory:  Positive for cough and shortness of breath. Negative for hemoptysis, sputum production  and wheezing.   Cardiovascular:  Negative for chest pain.  Gastrointestinal:  Negative for abdominal pain.  Neurological:  Positive for syncope.    Physical Exam Updated Vital Signs BP 131/85   Pulse 89   Temp 98.4 F (36.9 C) (Oral)   SpO2 94%  Physical Exam Vitals and nursing note reviewed.  Constitutional:      General: She is in acute distress.     Appearance: She is well-developed. She is obese.  HENT:     Head: Normocephalic and atraumatic.  Eyes:     Conjunctiva/sclera: Conjunctivae normal.  Cardiovascular:     Rate and Rhythm: Normal rate and regular rhythm.     Heart sounds: No murmur heard. Pulmonary:     Effort:  Tachypnea and accessory muscle usage present. No respiratory distress.     Breath sounds: Normal breath sounds.  Abdominal:     Palpations: Abdomen is soft.     Tenderness: There is no abdominal tenderness. There is no guarding or rebound.  Musculoskeletal:        General: No swelling.     Cervical back: Neck supple.     Right lower leg: No tenderness. Edema present.     Left lower leg: No tenderness. Edema present.  Skin:    General: Skin is warm and dry.     Capillary Refill: Capillary refill takes less than 2 seconds.  Neurological:     General: No focal deficit present.     Mental Status: She is alert.     ED Results / Procedures / Treatments   Labs (all labs ordered are listed, but only abnormal results are displayed) Labs Reviewed  BASIC METABOLIC PANEL - Abnormal; Notable for the following components:      Result Value   Creatinine, Ser 1.57 (*)    GFR, Estimated 43 (*)    All other components within normal limits  CBC WITH DIFFERENTIAL/PLATELET - Abnormal; Notable for the following components:   MCHC 29.8 (*)    RDW 17.0 (*)    Platelets 148 (*)    nRBC 0.3 (*)    All other components within normal limits  I-STAT VENOUS BLOOD GAS, ED - Abnormal; Notable for the following components:   pH, Ven 7.512 (*)    pO2, Ven 69 (*)    Bicarbonate 36.3 (*)    TCO2 38 (*)    Acid-Base Excess 12.0 (*)    Calcium, Ion 0.96 (*)    All other components within normal limits  RESP PANEL BY RT-PCR (FLU A&B, COVID) ARPGX2  CULTURE, BLOOD (ROUTINE X 2)  CULTURE, BLOOD (ROUTINE X 2)  BRAIN NATRIURETIC PEPTIDE  HCG, SERUM, QUALITATIVE  TROPONIN I (HIGH SENSITIVITY)  TROPONIN I (HIGH SENSITIVITY)    EKG EKG Interpretation  Date/Time:  Thursday December 06 2021 13:02:40 EDT Ventricular Rate:  93 PR Interval:  152 QRS Duration: 82 QT Interval:  384 QTC Calculation: 477 R Axis:   189 Text Interpretation: Normal sinus rhythm Right superior axis deviation Pulmonary disease  pattern Abnormal ECG When compared with ECG of 08-Nov-2021 06:43, No significant change since last tracing Confirmed by Aletta Edouard [phone]) on 12/06/2021 1:04:37 PM  Radiology DG Chest Port 1 View  Result Date: 12/06/2021 CLINICAL DATA:  Shortness of breath. EXAM: PORTABLE CHEST 1 VIEW COMPARISON:  December 04, 2021 FINDINGS: Enlarged cardiac silhouette. Similar in appearance mixed pattern pulmonary edema and/or atelectasis. IMPRESSION: Similar in appearance mixed pattern pulmonary edema and/or atelectasis. Electronically Signed   By: Thomas Hoff  Dimitrova M.D.   On: 12/06/2021 13:43    Procedures .Critical Care  Performed by: Hayden Rasmussen, MD Authorized by: Hayden Rasmussen, MD   Critical care provider statement:    Critical care time (minutes):  45   Critical care time was exclusive of:  Separately billable procedures and treating other patients   Critical care was necessary to treat or prevent imminent or life-threatening deterioration of the following conditions:  Respiratory failure   Critical care was time spent personally by me on the following activities:  Development of treatment plan with patient or surrogate, discussions with consultants, evaluation of patient's response to treatment, examination of patient, obtaining history from patient or surrogate, ordering and performing treatments and interventions, ordering and review of laboratory studies, ordering and review of radiographic studies, pulse oximetry, re-evaluation of patient's condition and review of old charts   I assumed direction of critical care for this patient from another provider in my specialty: no       Medications Ordered in ED Medications  budesonide (PULMICORT) nebulizer solution 0.25 mg (0.25 mg Nebulization Given 12/06/21 1614)  metFORMIN (GLUCOPHAGE) tablet 500 mg (has no administration in time range)  ipratropium-albuterol (DUONEB) 0.5-2.5 (3) MG/3ML nebulizer solution 3 mL (3 mLs Nebulization Given  12/06/21 1334)  magnesium sulfate IVPB 2 g 50 mL (0 g Intravenous Stopped 12/06/21 1520)  methylPREDNISolone sodium succinate (SOLU-MEDROL) 125 mg/2 mL injection 125 mg (125 mg Intravenous Given 12/06/21 1414)  furosemide (LASIX) injection 40 mg (40 mg Intravenous Given 12/06/21 1523)  potassium chloride SA (KLOR-CON M) CR tablet 40 mEq (40 mEq Oral Given 12/06/21 1522)    ED Course/ Medical Decision Making/ A&P Clinical Course as of 12/06/21 1714  Thu Dec 06, 2021  1321 Cardiac echo 3/23 -    1. Left ventricular ejection fraction, by estimation, is 60 to 65%. The  left ventricle has normal function. The left ventricle has no regional  wall motion abnormalities. Left ventricular diastolic function could not  be evaluated.   2. Right ventricular systolic function was not well visualized. The right  ventricular size is not well visualized. There is normal pulmonary artery  systolic pressure.   3. Left atrial size was moderately dilated.   4. The mitral valve was not well visualized. No evidence of mitral valve  regurgitation.   5. The aortic valve was not well visualized. Aortic valve regurgitation  is not visualized. No aortic stenosis is present.   6. The inferior vena cava is dilated in size with <50% respiratory  variability, suggesting right atrial pressure of 15 mmHg.  [MB]  8786 Chest x-ray showing cardiomegaly possible pulmonary edema.  Awaiting radiology reading. [MB]  1518 Discussed with Dr. Rogers Blocker [facility] who will put the patient in for an admission [MB]    Clinical Course User Index [MB] Hayden Rasmussen, MD                           Medical Decision Making Amount and/or Complexity of Data Reviewed Labs: ordered. Radiology: ordered.  Risk Prescription drug management. Decision regarding hospitalization.  Renee Paul was evaluated in Emergency Department on 12/06/2021 for the symptoms described in the history of present illness. She was evaluated in the  context of the global COVID-19 pandemic, which necessitated consideration that the patient might be at risk for infection with the SARS-CoV-2 virus that causes COVID-19. Institutional protocols and algorithms that pertain to the evaluation of patients at risk  for COVID-19 are in a state of rapid change based on information released by regulatory bodies including the CDC and federal and state organizations. These policies and algorithms were followed during the patient's care in the ED. This patient complains of shortness of breath syncope; this involves an extensive number of treatment Options and is a complaint that carries with it a high risk of complications and morbidity. The differential includes syncope, arrhythmia, hypoxia, pneumothorax, ACS, PE, pneumonia, COVID, flu, hypercapnia  I ordered, reviewed and interpreted labs, which included CBC with normal white blood count normal hemoglobin, chemistries normal other than new elevated creatinine, troponins flat, BMP unremarkable, VBG without acidosis, COVID and flu negative I ordered medication IV Lasix and potassium, IV magnesium, steroids and DuoNeb and reviewed PMP when indicated. I ordered imaging studies which included chest x-ray and I independently    visualized and interpreted imaging which showed cardiomegaly and probable CHF Additional history obtained from patient significant other Previous records obtained and reviewed in epic including recent discharge summaries I consulted Dr. Rogers Blocker [facility] and discussed lab and imaging findings and discussed disposition.  Cardiac monitoring reviewed, normal sinus rhythm Social determinants considered, tobacco use, housing insecurity Critical Interventions: Work-up and management of patient's acute hypoxia  After the interventions stated above, I reevaluated the patient and found to be much more comfortable appearing satting in the low 90s on 4 L nasal cannula Admission and further  testing considered, patient would benefit from mission to the hospital for continued work-up and management of her hypoxic respiratory failure.  Patient in agreement with plan for admission          Final Clinical Impression(s) / ED Diagnoses Final diagnoses:  Acute respiratory failure with hypoxia [facility])    Rx / DC Orders ED Discharge Orders     None         Hayden Rasmussen, MD 12/06/21 1717

## 2021-12-06 NOTE — Assessment & Plan Note (Signed)
Observation medical bed.  Check an echo due to her syncope today from walking but I suspect this is probably secondary to her being out of shape and breathing too fast for the amount of exertion she put in to walking to her car.  Patient clearly has obesity hypoventilation syndrome.  She will need to use CPAP at home.  We will try giving her some Elavil at home to help with anxiety and for some sedation so she can tolerate the CPAP mask.  She has no wheezing.  Will not continue steroids.

## 2021-12-06 NOTE — Progress Notes (Signed)
Pt arrived in the unit 5N08 from [facility]. A&Ox4 on 96% 5L oxygen. Pt c/o being starving. Attending MD was paged.

## 2021-12-06 NOTE — Assessment & Plan Note (Signed)
-

## 2021-12-06 NOTE — Progress Notes (Signed)
Plan of Care Note for accepted transfer   Patient: Renee Paul MRN: [id number]   Mertens: 12/06/2021  Facility requesting transfer: acute on chronic respiratory failure  Requesting Provider: Dr. Melina Copa  Reason for transfer: acute on chronic respiratory failure  Facility course: 36 year old with hx of OHS, OSA, asthma, diastolic CHF, IDA, morbid obesity, possible chronic respiratory failure on 4L prn, tobacco  abuse who recently was hospitalized at [facility] on 9/12 and left AMA who presented to ED with complaints of Shortness of breath. She also had syncopal event walking to her care.   Vitals: afebrile, bp: 131/85, HR: 89, oxygen: 67% RA>94% on 6L [place].  Pertinent labs: creatinine: 1.57,  Istat venous blood gas: pH: 7.512, co2: 45, oxygen: 69 Bnp pending CXR: similar to previous. Mixed pattern of pulmonary edema and/or atelectasis In ED: given lasix, duoneb, magnesium, solumedrol and potassium.   Plan of care: The patient is accepted for admission to Telemetry unit, at [facility]    Author: Orma Flaming, MD 12/06/2021  Check [email or web address] for on-call coverage.  Nursing staff, Please call [place] number on Amion as soon as patient's arrival, so appropriate admitting provider can evaluate the pt.

## 2021-12-07 ENCOUNTER — Ambulatory Visit (HOSPITAL_COMMUNITY): Payer: No Typology Code available for payment source

## 2021-12-07 ENCOUNTER — Observation Stay (HOSPITAL_COMMUNITY): Payer: No Typology Code available for payment source

## 2021-12-07 DIAGNOSIS — Z79899 Other long term (current) drug therapy: Secondary | ICD-10-CM | POA: Diagnosis not present

## 2021-12-07 DIAGNOSIS — Z20822 Contact with and (suspected) exposure to covid-19: Secondary | ICD-10-CM | POA: Diagnosis present

## 2021-12-07 DIAGNOSIS — Z6841 Body Mass Index (BMI) 40.0 and over, adult: Secondary | ICD-10-CM | POA: Diagnosis not present

## 2021-12-07 DIAGNOSIS — J9622 Acute and chronic respiratory failure with hypercapnia: Secondary | ICD-10-CM | POA: Diagnosis present

## 2021-12-07 DIAGNOSIS — G4733 Obstructive sleep apnea (adult) (pediatric): Secondary | ICD-10-CM | POA: Diagnosis not present

## 2021-12-07 DIAGNOSIS — Z9981 Dependence on supplemental oxygen: Secondary | ICD-10-CM | POA: Diagnosis not present

## 2021-12-07 DIAGNOSIS — J9601 Acute respiratory failure with hypoxia: Secondary | ICD-10-CM | POA: Diagnosis present

## 2021-12-07 DIAGNOSIS — Z7951 Long term (current) use of inhaled steroids: Secondary | ICD-10-CM | POA: Diagnosis not present

## 2021-12-07 DIAGNOSIS — F40240 Claustrophobia: Secondary | ICD-10-CM | POA: Diagnosis present

## 2021-12-07 DIAGNOSIS — Z91199 Patient's noncompliance with other medical treatment and regimen due to unspecified reason: Secondary | ICD-10-CM | POA: Diagnosis not present

## 2021-12-07 DIAGNOSIS — M7989 Other specified soft tissue disorders: Secondary | ICD-10-CM | POA: Diagnosis not present

## 2021-12-07 DIAGNOSIS — R55 Syncope and collapse: Secondary | ICD-10-CM | POA: Diagnosis present

## 2021-12-07 DIAGNOSIS — Z9049 Acquired absence of other specified parts of digestive tract: Secondary | ICD-10-CM | POA: Diagnosis not present

## 2021-12-07 DIAGNOSIS — J9811 Atelectasis: Secondary | ICD-10-CM | POA: Diagnosis present

## 2021-12-07 DIAGNOSIS — E662 Morbid (severe) obesity with alveolar hypoventilation: Secondary | ICD-10-CM | POA: Diagnosis present

## 2021-12-07 DIAGNOSIS — I5032 Chronic diastolic (congestive) heart failure: Secondary | ICD-10-CM | POA: Diagnosis present

## 2021-12-07 DIAGNOSIS — Z7984 Long term (current) use of oral hypoglycemic drugs: Secondary | ICD-10-CM | POA: Diagnosis not present

## 2021-12-07 DIAGNOSIS — J9620 Acute and chronic respiratory failure, unspecified whether with hypoxia or hypercapnia: Secondary | ICD-10-CM | POA: Diagnosis present

## 2021-12-07 DIAGNOSIS — N179 Acute kidney failure, unspecified: Secondary | ICD-10-CM | POA: Diagnosis present

## 2021-12-07 DIAGNOSIS — J9621 Acute and chronic respiratory failure with hypoxia: Secondary | ICD-10-CM | POA: Diagnosis present

## 2021-12-07 DIAGNOSIS — F17210 Nicotine dependence, cigarettes, uncomplicated: Secondary | ICD-10-CM | POA: Diagnosis present

## 2021-12-07 LAB — GLUCOSE, CAPILLARY
Glucose-Capillary: 169 mg/dL — ABNORMAL HIGH (ref 70–99)
Glucose-Capillary: 131 mg/dL — ABNORMAL HIGH (ref 70–99)
Glucose-Capillary: 162 mg/dL — ABNORMAL HIGH (ref 70–99)
Glucose-Capillary: 202 mg/dL — ABNORMAL HIGH (ref 70–99)

## 2021-12-07 LAB — ECHOCARDIOGRAM COMPLETE
Area-P 1/2: 3.77 cm2
Calc EF: 57.2 %
Height: 64 in
S' Lateral: 3.2 cm
Single Plane A2C EF: 57.4 %
Single Plane A4C EF: 56.8 %
Weight: 7600 oz

## 2021-12-07 LAB — COMPREHENSIVE METABOLIC PANEL
ALT: 18 U/L (ref 0–44)
AST: 19 U/L (ref 15–41)
Albumin: 2.8 g/dL — ABNORMAL LOW (ref 3.5–5.0)
Alkaline Phosphatase: 56 U/L (ref 38–126)
Anion gap: 10 (ref 5–15)
BUN: 8 mg/dL (ref 6–20)
CO2: 31 mmol/L (ref 22–32)
Calcium: 8.6 mg/dL — ABNORMAL LOW (ref 8.9–10.3)
Chloride: 98 mmol/L (ref 98–111)
Creatinine, Ser: 0.82 mg/dL (ref 0.44–1.00)
GFR, Estimated: >60 mL/min (ref >60)
Glucose, Bld: 263 mg/dL — ABNORMAL HIGH (ref 70–99)
Potassium: 4.5 mmol/L (ref 3.5–5.1)
Sodium: 139 mmol/L (ref 135–145)
Total Bilirubin: 0.6 mg/dL (ref 0.3–1.2)
Total Protein: 7.2 g/dL (ref 6.5–8.1)

## 2021-12-07 LAB — MAGNESIUM: Magnesium: 2.2 mg/dL (ref 1.7–2.4)

## 2021-12-07 MED ORDER — PREDNISONE 20 MG PO TABS
60.0000 mg | ORAL_TABLET | Freq: Two times a day (BID) | ORAL | Status: DC
  Administered 2021-12-07 – 2021-12-08 (×3): 60 mg via ORAL
  Filled 2021-12-07 (×3): qty 3

## 2021-12-07 MED ORDER — PERFLUTREN LIPID MICROSPHERE
1.0000 mL | INTRAVENOUS | Status: AC | PRN
  Administered 2021-12-07: 2 mL via INTRAVENOUS

## 2021-12-07 NOTE — Progress Notes (Signed)
Mobility Specialist Progress Note   12/07/21 1108  Mobility  Activity Refused mobility   After mod encouragement pt deferring mobility this AM claiming they are extremely tired and would like to rest until lunch. Will f/u around lunch time if time permits  Petal Specialist [place] #:  [phone] Acute Rehab Office:  [phone]

## 2021-12-07 NOTE — Progress Notes (Signed)
Lower extremity venous bilateral study completed.   Please see CV Proc for preliminary results.   Avigdor Dollar, RDMS, RVT  

## 2021-12-07 NOTE — Progress Notes (Signed)
  Echocardiogram 2D Echocardiogram has been performed.  Lana Fish 12/07/2021, 9:47 AM

## 2021-12-07 NOTE — Progress Notes (Signed)
[facility] PROGRESS NOTE   Renee Paul ZDG:[id number] DOB: 02/04/1986 DOA: 12/06/2021  PCP: [facility]  Brief History/Interval Summary: 36 year old African-American female history of super morbid obesity BMI of 81.53 (weight of 215 kg), history of chronic diastolic heart failure, chronic hypoxic and hypercapnic respiratory failure on home oxygen at night at 2 L a minute, noncompliance with CPAP presented to the ER with a syncopal episode at work.  Patient states that she had walked in from the parking lot.  She had forgotten her badge.  She had to go back out to the parking lot to get her badge.  She was walking quickly.  She does not use oxygen normally during the day.  She states that by the time she got to her car she was extremely short winded.  She thinks she passed out.  She did not hit her head.  She was embarrassed that she passed out due to walking so fast.  She declined EMS transport.  She brought herself to the ER.  Apparently left AGAINST MEDICAL ADVICE from [facility] on September 13.  Apparently found to have hypercapnia during her hospitalization.  Noninvasive ventilator was recommended but she left AGAINST MEDICAL ADVICE.  She was hospitalized for further management.  Initial oxygen saturation in the emergency department was 67%.  Consultants: None  Procedures: Transthoracic echocardiogram and lower extremity Doppler studies pending    Subjective/Interval History: Patient somnolent this morning but easily arousable.  Mentions that she does not like to wear the CPAP because it makes her claustrophobic.  Currently denies any chest pain.  Denies any lightheadedness.  Shortness of breath is better today compared to last night.    Assessment/Plan:  Acute on chronic respiratory failure with hypoxia Etiology for this is unclear.  She does have a history of chronic respiratory failure and is on oxygen at 2 L/min by nasal  cannula at home.  Chest x-ray raise concern for pulmonary edema or atelectasis.  BNP was however normal. Echocardiogram is pending.  Continues to be on nasal cannula at 6 L/min.  Continue to try and wean down to maintain saturations greater than 90%. Recent CT angiogram done in August was reviewed.  No PE was noted on that study.  Mild to moderate severity atelectasis was noted bilaterally.  There was also a groundglass appearance of the lung parenchyma with concern for pneumonitis. It looks like she was in the hospital at that time.  She was treated with furosemide during that admission. There is also apparent history of asthma.  Her WBC is noted to be normal. We could give her a trial of steroids. She does have lower extremity edema right greater than left.  Somewhat chronic per patient.  She has never had a Doppler study.  We will do lower extremity venous Dopplers.  Chronic respiratory failure with hypoxia and hypercapnia Apparently found to have hypercapnia during recent hospitalization at [facility]. She does use CPAP but not very compliant with the same.  He is on oxygen at home at 2 L/min but only at nighttime.  Acute kidney injury Presented with creatinine of 1.57.  Thought to be due to overuse of furosemide.  Noted to be 0.82 this morning.  Chronic diastolic CHF BNP is normal.  Patient appears to be euvolemic for the most part.  Obstructive sleep apnea Compliance emphasized with CPAP.  Class III obesity Estimated body mass index is 81.53 kg/m as calculated from the following:   Height as  of this encounter: '5\' 4"'$  (1.626 m).   Weight as of this encounter: 215.5 kg.  DVT Prophylaxis: Subcutaneous heparin Code Status: Full code Family Communication: Discussed with patient Disposition Plan: Hopefully return home 24 to 48 hours  Status is: Inpatient Remains inpatient appropriate because: Acute respiratory failure with hypoxia      Medications: Scheduled:  amitriptyline   100 mg Oral QHS   budesonide  0.25 mg Nebulization BID   heparin  5,000 Units Subcutaneous Q8H   metFORMIN  500 mg Oral Q breakfast   Continuous: RFX:[id number] **OR** acetaminophen, ondansetron **OR** ondansetron (ZOFRAN) IV, perflutren lipid microspheres (DEFINITY) IV suspension  Antibiotics: Anti-infectives (From admission, onward)    None       Objective:  Vital Signs  Vitals:   12/07/21 0034 12/07/21 0557 12/07/21 0638 12/07/21 0828  BP:  126/84  124/70  Pulse: 88 72  85  Resp: '20 16  15  '$ Temp:  98.8 F (37.1 C)  98.5 F (36.9 C)  TempSrc:    Oral  SpO2: 97% 97% 97% 93%  Weight:      Height:        Intake/Output Summary (Last 24 hours) at 12/07/2021 1116 Last data filed at 12/06/2021 1520 Gross per 24 hour  Intake 49.63 ml  Output --  Net 49.63 ml   Filed Weights   12/06/21 1324  Weight: (!) 215.5 kg    General appearance: Awake alert.  In no distress Resp: Normal effort at rest.  Diminished air entry at the bases.  No wheezing or rhonchi. Cardio: S1-S2 is normal regular.  No S3-S4.  No rubs murmurs or bruit GI: Abdomen is soft.  Nontender nondistended.  Bowel sounds are present normal.  No masses organomegaly Extremities: Edema bilateral lower remedies right greater than left. Neurologic: Alert and oriented x3.  No focal neurological deficits.    Lab Results:  Data Reviewed: I have personally reviewed following labs and reports of the imaging studies  CBC: Recent Labs  Lab 12/06/21 1358 12/06/21 1425  WBC 6.6  --   NEUTROABS 4.9  --   HGB 12.8 14.3  HCT 43.0 42.0  MCV 93.9  --   PLT 148*  --     Basic Metabolic Panel: Recent Labs  Lab 12/06/21 1358 12/06/21 1425 12/07/21 0114  NA 136 138 139  K 3.7 3.8 4.5  CL 99  --  98  CO2 29  --  31  GLUCOSE 90  --  263*  BUN 9  --  8  CREATININE 1.57*  --  0.82  CALCIUM 8.9  --  8.6*  MG  --   --  2.2    GFR: Estimated Creatinine Clearance: 174.8 mL/min (by C-G formula based on SCr  of 0.82 mg/dL).  Liver Function Tests: Recent Labs  Lab 12/07/21 0114  AST 19  ALT 18  ALKPHOS 56  BILITOT 0.6  PROT 7.2  ALBUMIN 2.8*     CBG: Recent Labs  Lab 12/07/21 0831  GLUCAP 169*     Recent Results (from the past 240 hour(s))  Resp Panel by RT-PCR (Flu A&B, Covid) Anterior Nasal Swab     Status: None   Collection Time: 12/06/21  1:18 PM   Specimen: Anterior Nasal Swab  Result Value Ref Range Status   SARS Coronavirus 2 by RT PCR NEGATIVE NEGATIVE Final    Comment: (NOTE) SARS-CoV-2 target nucleic acids are NOT DETECTED.  The SARS-CoV-2 RNA is generally detectable in upper respiratory specimens  during the acute phase of infection. The lowest concentration of SARS-CoV-2 viral copies this assay can detect is 138 copies/mL. A negative result does not preclude SARS-Cov-2 infection and should not be used as the sole basis for treatment or other patient management decisions. A negative result may occur with  improper specimen collection/handling, submission of specimen other than nasopharyngeal swab, presence of viral mutation(s) within the areas targeted by this assay, and inadequate number of viral copies(<138 copies/mL). A negative result must be combined with clinical observations, patient history, and epidemiological information. The expected result is Negative.  Fact Sheet for Patients:  EntrepreneurPulse.com.au  Fact Sheet for Healthcare Providers:  IncredibleEmployment.be  This test is no t yet approved or cleared by the [place] FDA and  has been authorized for detection and/or diagnosis of SARS-CoV-2 by FDA under an Emergency Use Authorization (EUA). This EUA will remain  in effect (meaning this test can be used) for the duration of the COVID-19 declaration under Section 564(b)(1) of the Act, 21 U.S.C.section 360bbb-3(b)(1), unless the authorization is terminated  or revoked sooner.       Influenza A by  PCR NEGATIVE NEGATIVE Final   Influenza B by PCR NEGATIVE NEGATIVE Final    Comment: (NOTE) The Xpert Xpress SARS-CoV-2/FLU/RSV plus assay is intended as an aid in the diagnosis of influenza from Nasopharyngeal swab specimens and should not be used as a sole basis for treatment. Nasal washings and aspirates are unacceptable for Xpert Xpress SARS-CoV-2/FLU/RSV testing.  Fact Sheet for Patients: EntrepreneurPulse.com.au  Fact Sheet for Healthcare Providers: IncredibleEmployment.be  This test is not yet approved or cleared by the [place] FDA and has been authorized for detection and/or diagnosis of SARS-CoV-2 by FDA under an Emergency Use Authorization (EUA). This EUA will remain in effect (meaning this test can be used) for the duration of the COVID-19 declaration under Section 564(b)(1) of the Act, 21 U.S.C. section 360bbb-3(b)(1), unless the authorization is terminated or revoked.  Performed at [facility], [address] [place], [place] [address]   Culture, blood (routine x 2)     Status: None (Preliminary result)   Collection Time: 12/06/21  2:10 PM   Specimen: Right Antecubital; Blood  Result Value Ref Range Status   Specimen Description   Final    RIGHT ANTECUBITAL BLOOD Performed at [facility], [place] [address], [place], [place] [address]    Special Requests   Final    BOTTLES DRAWN AEROBIC AND ANAEROBIC Blood Culture results may not be optimal due to an inadequate volume of blood received in culture bottles Performed at [facility], [address] [place], [place] [address]    Culture   Final    NO GROWTH < 24 HOURS Performed at [facility], [place] [address] [place], [place] [address]    Report Status PENDING  Incomplete  Culture, blood (routine x 2)     Status: None (Preliminary result)   Collection Time: 12/06/21 10:48 PM   Specimen: BLOOD RIGHT HAND  Result Value Ref Range Status    Specimen Description BLOOD RIGHT HAND  Final   Special Requests   Final    BOTTLES DRAWN AEROBIC ONLY Blood Culture adequate volume   Culture   Final    NO GROWTH < 12 HOURS Performed at [facility], [place] [address] [place], [place] [address]    Report Status PENDING  Incomplete      Radiology Studies: DG  Chest Port 1 View  Result Date: 12/06/2021 CLINICAL DATA:  Shortness of breath. EXAM: PORTABLE CHEST 1 VIEW COMPARISON:  December 04, 2021 FINDINGS: Enlarged cardiac silhouette. Similar in appearance mixed pattern pulmonary edema and/or atelectasis. IMPRESSION: Similar in appearance mixed pattern pulmonary edema and/or atelectasis. Electronically Signed   By: Fidela Salisbury M.D.   On: 12/06/2021 13:43       LOS: 0 days   [facility] Pager on [email or web address]  12/07/2021, 11:16 AM

## 2021-12-07 NOTE — Progress Notes (Signed)
Pt placed on CPAP w/ 4L bleed in. Rt will cont to monitor as needed.

## 2021-12-07 NOTE — Progress Notes (Signed)
Mobility Specialist Progress Note   12/07/21 1255  Mobility  Activity Transferred from bed to chair  Level of Assistance Independent after set-up  Assistive Device None  Distance Ambulated (ft) 4 ft  Activity Response Tolerated well  $Mobility charge 1 Mobility   Pt received in bed and c/o being tired and not being able to get rest but no physical complaints. Able to independently transfer self safely in chair. Left call bell by side w/ needs met.   Holland Falling Mobility Specialist MS [facility] #:  [phone] Acute Rehab Office:  [phone]

## 2021-12-07 NOTE — Plan of Care (Signed)

## 2021-12-07 NOTE — Progress Notes (Signed)
Pt took off her CPAP. She said it's uncomfortable. Pt ambulates to the bathroom and is obviously dyspneic on exertion. Pt was placed back to nasal cannula. She's on 98% on 6L.

## 2021-12-08 DIAGNOSIS — J9621 Acute and chronic respiratory failure with hypoxia: Secondary | ICD-10-CM | POA: Diagnosis not present

## 2021-12-08 LAB — BASIC METABOLIC PANEL
Anion gap: 11 (ref 5–15)
BUN: 16 mg/dL (ref 6–20)
CO2: 29 mmol/L (ref 22–32)
Calcium: 8.6 mg/dL — ABNORMAL LOW (ref 8.9–10.3)
Chloride: 96 mmol/L — ABNORMAL LOW (ref 98–111)
Creatinine, Ser: 0.79 mg/dL (ref 0.44–1.00)
GFR, Estimated: >60 mL/min (ref >60)
Glucose, Bld: 230 mg/dL — ABNORMAL HIGH (ref 70–99)
Potassium: 4.7 mmol/L (ref 3.5–5.1)
Sodium: 136 mmol/L (ref 135–145)

## 2021-12-08 LAB — CBC
HCT: 42.3 % (ref 36.0–46.0)
Hemoglobin: 12.2 g/dL (ref 12.0–15.0)
MCH: 27.4 pg (ref 26.0–34.0)
MCHC: 28.8 g/dL — ABNORMAL LOW (ref 30.0–36.0)
MCV: 95.1 fL (ref 80.0–100.0)
Platelets: 255 10*3/uL (ref 150–400)
RBC: 4.45 MIL/uL (ref 3.87–5.11)
RDW: 16.8 % — ABNORMAL HIGH (ref 11.5–15.5)
WBC: 10.2 10*3/uL (ref 4.0–10.5)
nRBC: 0 % (ref 0.0–0.2)

## 2021-12-08 LAB — GLUCOSE, CAPILLARY
Glucose-Capillary: 151 mg/dL — ABNORMAL HIGH (ref 70–99)
Glucose-Capillary: 307 mg/dL — ABNORMAL HIGH (ref 70–99)

## 2021-12-08 MED ORDER — PREDNISONE 20 MG PO TABS: ORAL_TABLET | ORAL | 0 refills | Status: DC

## 2021-12-08 NOTE — Discharge Summary (Signed)
[facility]  Physician Discharge Summary   Patient ID: Renee Paul MRN: [id number] DOB/AGE: 1986/03/11 36 y.o.  Admit date: 12/06/2021 Discharge date: 12/08/2021    PCP: [facility], [facility]  DISCHARGE DIAGNOSES:    Acute on chronic respiratory failure with hypoxia (HCC)   Chronic respiratory failure with hypoxia and hypercapnia (HCC)   AKI (acute kidney injury) ([place])   OSA (obstructive sleep apnea)   Chronic diastolic CHF (congestive heart failure) ([place])    RECOMMENDATIONS FOR OUTPATIENT FOLLOW UP: Ambulatory referral sent to pulmonology   Home Health: None Equipment/Devices: None  CODE STATUS: Full code  DISCHARGE CONDITION: fair  Diet recommendation: As before  INITIAL HISTORY: 36 year old African-American female history of super morbid obesity BMI of 81.53 (weight of 215 kg), history of chronic diastolic heart failure, chronic hypoxic and hypercapnic respiratory failure on home oxygen at night at 2 L a minute, noncompliance with CPAP presented to the ER with a syncopal episode at work.  Patient states that she had walked in from the parking lot.  She had forgotten her badge.  She had to go back out to the parking lot to get her badge.  She was walking quickly.  She does not use oxygen normally during the day.  She states that by the time she got to her car she was extremely short winded.  She thinks she passed out.  She did not hit her head.  She was embarrassed that she passed out due to walking so fast.  She declined EMS transport.  She brought herself to the ER.  Apparently left AGAINST MEDICAL ADVICE from [facility] on September 13.  Apparently found to have hypercapnia during her hospitalization.  Noninvasive ventilator was recommended but she left AGAINST MEDICAL ADVICE.  She was hospitalized for further management.  Initial oxygen saturation in the emergency department was 67%.   Consultants: None   Procedures:  Transthoracic echocardiogram and lower extremity Doppler studies   HOSPITAL COURSE:   Acute on chronic respiratory failure with hypoxia Etiology for this is unclear.  She does have a history of chronic respiratory failure and is on oxygen at 2 L/min by nasal cannula at home.  Chest x-ray raised concern for pulmonary edema or atelectasis.  BNP was however normal. Initially required nasal cannula oxygen at 6 L/min.  Subsequently weaned down to 2 L. Recent CT angiogram done in August was reviewed.  No PE was noted on that study.  Mild to moderate severity atelectasis was noted bilaterally.  There was also a groundglass appearance of the lung parenchyma with concern for pneumonitis. It looks like she was in the hospital at that time.  She was treated with furosemide during that admission. There is also apparent history of asthma.  Her WBC is noted to be normal. She was started on steroids.  Lower extremity Doppler studies negative for DVT.  Echocardiogram shows normal systolic function of the left ventricle.  Right ventricular function was also normal.  Patient's symptoms improved.  Partly her symptoms could be due to noncompliance with her CPAP.  She may benefit from referral to pulmonology as she could be a candidate for trilogy ventilator.  Ambulatory referral was sent to pulmonology.  Patient has improved.  Okay for discharge today.   Chronic respiratory failure with hypoxia and hypercapnia Apparently found to have hypercapnia during recent hospitalization at [facility]. She does use CPAP but not very compliant with the same.  Compliance was emphasized.   Acute kidney injury  Presented with creatinine of 1.57.  Thought to be due to overuse of furosemide.  Noted to be 0.82 subsequently.   Chronic diastolic CHF BNP is normal.  Patient appears to be euvolemic for the most part.   Obstructive sleep apnea Compliance emphasized with CPAP.   Class III obesity Estimated body mass index is 81.53  kg/m as calculated from the following:   Height as of this encounter: '5\' 4"'$  (1.626 m).   Weight as of this encounter: 215.5 kg.    Patient is stable.  Okay for discharge home today.   PERTINENT LABS:  The results of significant diagnostics from this hospitalization (including imaging, microbiology, ancillary and laboratory) are listed below for reference.    Microbiology: Recent Results (from the past 240 hour(s))  Resp Panel by RT-PCR (Flu A&B, Covid) Anterior Nasal Swab     Status: None   Collection Time: 12/06/21  1:18 PM   Specimen: Anterior Nasal Swab  Result Value Ref Range Status   SARS Coronavirus 2 by RT PCR NEGATIVE NEGATIVE Final    Comment: (NOTE) SARS-CoV-2 target nucleic acids are NOT DETECTED.  The SARS-CoV-2 RNA is generally detectable in upper respiratory specimens during the acute phase of infection. The lowest concentration of SARS-CoV-2 viral copies this assay can detect is 138 copies/mL. A negative result does not preclude SARS-Cov-2 infection and should not be used as the sole basis for treatment or other patient management decisions. A negative result may occur with  improper specimen collection/handling, submission of specimen other than nasopharyngeal swab, presence of viral mutation(s) within the areas targeted by this assay, and inadequate number of viral copies(<138 copies/mL). A negative result must be combined with clinical observations, patient history, and epidemiological information. The expected result is Negative.  Fact Sheet for Patients:  EntrepreneurPulse.com.au  Fact Sheet for Healthcare Providers:  IncredibleEmployment.be  This test is no t yet approved or cleared by the [place] FDA and  has been authorized for detection and/or diagnosis of SARS-CoV-2 by FDA under an Emergency Use Authorization (EUA). This EUA will remain  in effect (meaning this test can be used) for the duration of  the COVID-19 declaration under Section 564(b)(1) of the Act, 21 U.S.C.section 360bbb-3(b)(1), unless the authorization is terminated  or revoked sooner.       Influenza A by PCR NEGATIVE NEGATIVE Final   Influenza B by PCR NEGATIVE NEGATIVE Final    Comment: (NOTE) The Xpert Xpress SARS-CoV-2/FLU/RSV plus assay is intended as an aid in the diagnosis of influenza from Nasopharyngeal swab specimens and should not be used as a sole basis for treatment. Nasal washings and aspirates are unacceptable for Xpert Xpress SARS-CoV-2/FLU/RSV testing.  Fact Sheet for Patients: EntrepreneurPulse.com.au  Fact Sheet for Healthcare Providers: IncredibleEmployment.be  This test is not yet approved or cleared by the [place] FDA and has been authorized for detection and/or diagnosis of SARS-CoV-2 by FDA under an Emergency Use Authorization (EUA). This EUA will remain in effect (meaning this test can be used) for the duration of the COVID-19 declaration under Section 564(b)(1) of the Act, 21 U.S.C. section 360bbb-3(b)(1), unless the authorization is terminated or revoked.  Performed at [facility], [address] [place], [place] [address]   Culture, blood (routine x 2)     Status: None (Preliminary result)   Collection Time: 12/06/21  2:10 PM   Specimen: Right Antecubital; Blood  Result Value Ref Range Status   Specimen Description   Final    RIGHT  ANTECUBITAL BLOOD Performed at [facility], [place] [address], [place], [place] [address]    Special Requests   Final    BOTTLES DRAWN AEROBIC AND ANAEROBIC Blood Culture results may not be optimal due to an inadequate volume of blood received in culture bottles Performed at [facility], [address] [place], [place] [address]    Culture   Final    NO GROWTH 3 DAYS Performed at [facility], [place] [address] [place], [place] [address]    Report Status PENDING   Incomplete  Culture, blood (routine x 2)     Status: None (Preliminary result)   Collection Time: 12/06/21 10:48 PM   Specimen: BLOOD RIGHT HAND  Result Value Ref Range Status   Specimen Description BLOOD RIGHT HAND  Final   Special Requests   Final    BOTTLES DRAWN AEROBIC ONLY Blood Culture adequate volume   Culture   Final    NO GROWTH 3 DAYS Performed at [facility], [place] [address], [place], [place] [address]    Report Status PENDING  Incomplete     Labs:   Basic Metabolic Panel: Recent Labs  Lab 12/06/21 1358 12/06/21 1425 12/07/21 0114 12/08/21 0112  NA 136 138 139 136  K 3.7 3.8 4.5 4.7  CL 99  --  98 96*  CO2 29  --  31 29  GLUCOSE 90  --  263* 230*  BUN 9  --  8 16  CREATININE 1.57*  --  0.82 0.79  CALCIUM 8.9  --  8.6* 8.6*  MG  --   --  2.2  --    Liver Function Tests: Recent Labs  Lab 12/07/21 0114  AST 19  ALT 18  ALKPHOS 56  BILITOT 0.6  PROT 7.2  ALBUMIN 2.8*    CBC: Recent Labs  Lab 12/06/21 1358 12/06/21 1425 12/08/21 0112  WBC 6.6  --  10.2  NEUTROABS 4.9  --   --   HGB 12.8 14.3 12.2  HCT 43.0 42.0 42.3  MCV 93.9  --  95.1  PLT 148*  --  255    BNP: BNP (last 3 results) Recent Labs    10/31/21 2342 11/08/21 0642 12/06/21 1410  BNP 43.7 21.2 80.2    CBG: Recent Labs  Lab 12/07/21 1154 12/07/21 1603 12/07/21 2110 12/08/21 0845 12/08/21 1156  GLUCAP 131* 162* 202* 151* 307*     IMAGING STUDIES VAS [place] LOWER EXTREMITY VENOUS (DVT)  Result Date: 12/08/2021  Lower Venous DVT Study Patient Name:  EBELYN BOHNET  Date of Exam:   12/07/2021 Medical Rec #: [id number]        Accession #:    [id number] Date of Birth: 04/29/1985        Patient Gender: F Patient Age:   2 years Exam Location:  [facility] Procedure:      VAS [place] LOWER EXTREMITY VENOUS (DVT) Referring Phys: Bonnielee Haff --------------------------------------------------------------------------------  Indications: Swelling.  Limitations: Body  habitus. BMI 81. Comparison Study: No prior studies. Performing Technologist: Darlin Coco RDMS, RVT  Examination Guidelines: A complete evaluation includes B-mode imaging, spectral Doppler, color Doppler, and power Doppler as needed of all accessible portions of each vessel. Bilateral testing is considered an integral part of a complete examination. Limited examinations for reoccurring indications may be performed as noted. The reflux portion of the exam is performed with the patient in reverse Trendelenburg.  +---------+---------------+---------+-----------+----------+--------------+ RIGHT    CompressibilityPhasicitySpontaneityPropertiesThrombus  Aging +---------+---------------+---------+-----------+----------+--------------+ CFV      Full           Yes      Yes                                 +---------+---------------+---------+-----------+----------+--------------+ SFJ      Full                                                        +---------+---------------+---------+-----------+----------+--------------+ FV Prox  Full                                                        +---------+---------------+---------+-----------+----------+--------------+ FV Mid   Full                                                        +---------+---------------+---------+-----------+----------+--------------+ FV Distal               Yes      Yes                                 +---------+---------------+---------+-----------+----------+--------------+ PFV      Full                                                        +---------+---------------+---------+-----------+----------+--------------+ POP      Full           Yes      Yes                                 +---------+---------------+---------+-----------+----------+--------------+ PTV      Full                                                         +---------+---------------+---------+-----------+----------+--------------+ PERO     Full                                                        +---------+---------------+---------+-----------+----------+--------------+   +---------+---------------+---------+-----------+----------+--------------+ LEFT     CompressibilityPhasicitySpontaneityPropertiesThrombus Aging +---------+---------------+---------+-----------+----------+--------------+ CFV      Full           Yes      Yes                                 +---------+---------------+---------+-----------+----------+--------------+  SFJ      Full                                                        +---------+---------------+---------+-----------+----------+--------------+ FV Prox  Full                                                        +---------+---------------+---------+-----------+----------+--------------+ FV Mid   Full                                                        +---------+---------------+---------+-----------+----------+--------------+ FV DistalFull           Yes      Yes                                 +---------+---------------+---------+-----------+----------+--------------+ PFV      Full                                                        +---------+---------------+---------+-----------+----------+--------------+ POP      Full           Yes      Yes                                 +---------+---------------+---------+-----------+----------+--------------+ PTV      Full                                                        +---------+---------------+---------+-----------+----------+--------------+ PERO     Full                                                        +---------+---------------+---------+-----------+----------+--------------+     Summary: RIGHT: - There is no evidence of deep vein thrombosis in the lower extremity.  - No cystic structure found in  the popliteal fossa.  LEFT: - There is no evidence of deep vein thrombosis in the lower extremity.  - No cystic structure found in the popliteal fossa.  *See table(s) above for measurements and observations. Electronically signed by Servando Snare MD on 12/08/2021 at 10:41:05 AM.    Final    ECHOCARDIOGRAM COMPLETE  Result Date: 12/07/2021    ECHOCARDIOGRAM REPORT   Patient Name:   CAYCEE WANAT Date of Exam: 12/07/2021 Medical Rec #:  [id number]       Height:  64.0 in Accession #:    [id number]      Weight:       475.0 lb Date of Birth:  1986-02-16       BSA:          2.826 m Patient Age:    71 years        BP:           126/84 mmHg Patient Gender: F               HR:           63 bpm. Exam Location:  Inpatient Procedure: 2D Echo and Intracardiac Opacification Agent Indications:    Syncope  History:        Patient has prior history of Echocardiogram examinations, most                 recent 06/05/2021. CHF.  Sonographer:    Harvie Junior Referring Phys: [phone] ERIC CHEN  Sonographer Comments: Technically difficult study due to poor echo windows and patient is obese. Image acquisition challenging due to patient body habitus. IMPRESSIONS  1. Left ventricular ejection fraction, by estimation, is 60 to 65%. The left ventricle has normal function. The left ventricle has no regional wall motion abnormalities. Left ventricular diastolic parameters are indeterminate.  2. Right ventricular systolic function is normal. The right ventricular size is not well visualized. There is normal pulmonary artery systolic pressure. The estimated right ventricular systolic pressure is 03.7 mmHg.  3. The mitral valve was not well visualized. No evidence of mitral valve regurgitation. No evidence of mitral stenosis.  4. The aortic valve was not well visualized. Aortic valve regurgitation is not visualized. No aortic stenosis is present. Comparison(s): No significant change from prior study. Conclusion(s)/Recommendation(s): Technically  difficult study. No signficant valve disease by Doppler evaluation, and grossly normal function with use of echo contrast. FINDINGS  Left Ventricle: Left ventricular ejection fraction, by estimation, is 60 to 65%. The left ventricle has normal function. The left ventricle has no regional wall motion abnormalities. Definity contrast agent was given IV to delineate the left ventricular  endocardial borders. The left ventricular internal cavity size was normal in size. There is borderline left ventricular hypertrophy. Left ventricular diastolic parameters are indeterminate. Right Ventricle: The right ventricular size is not well visualized. Right vetricular wall thickness was not well visualized. Right ventricular systolic function is normal. There is normal pulmonary artery systolic pressure. The tricuspid regurgitant velocity is 2.10 m/s, and with an assumed right atrial pressure of 3 mmHg, the estimated right ventricular systolic pressure is 04.8 mmHg. Left Atrium: Left atrial size was not well visualized. Right Atrium: Right atrial size was not well visualized. Pericardium: There is no evidence of pericardial effusion. Mitral Valve: The mitral valve was not well visualized. No evidence of mitral valve regurgitation. No evidence of mitral valve stenosis. Tricuspid Valve: The tricuspid valve is not well visualized. Tricuspid valve regurgitation is trivial. No evidence of tricuspid stenosis. Aortic Valve: The aortic valve was not well visualized. Aortic valve regurgitation is not visualized. No aortic stenosis is present. Pulmonic Valve: The pulmonic valve was not well visualized. Pulmonic valve regurgitation is trivial. No evidence of pulmonic stenosis. Aorta: The aortic root was not well visualized and the ascending aorta was not well visualized. IAS/Shunts: The interatrial septum was not well visualized.  LEFT VENTRICLE PLAX 2D LVIDd:         4.50 cm     Diastology LVIDs:  3.20 cm     LV e' medial:    6.64  cm/s LV PW:         1.20 cm     LV E/e' medial:  15.5 LV IVS:        1.10 cm     LV e' lateral:   8.59 cm/s LVOT diam:     2.50 cm     LV E/e' lateral: 12.0 LV SV:         89 LV SV Index:   31 LVOT Area:     4.91 cm  LV Volumes (MOD) LV vol d, MOD A2C: 62.9 ml LV vol d, MOD A4C: 63.7 ml LV vol s, MOD A2C: 26.8 ml LV vol s, MOD A4C: 27.5 ml LV SV MOD A2C:     36.1 ml LV SV MOD A4C:     63.7 ml LV SV MOD BP:      36.4 ml RIGHT VENTRICLE RV S prime:     15.10 cm/s TAPSE (M-mode): 2.4 cm LEFT ATRIUM             Index LA diam:        3.60 cm 1.27 cm/m LA Vol (A2C):   24.1 ml 8.53 ml/m LA Vol (A4C):   45.1 ml 15.96 ml/m LA Biplane Vol: 35.1 ml 12.42 ml/m  AORTIC VALVE             PULMONIC VALVE LVOT Vmax:   102.00 cm/s PV Vmax:       0.86 m/s LVOT Vmean:  76.100 cm/s PV Peak grad:  2.9 mmHg LVOT VTI:    0.181 m  AORTA Ao Root diam: 3.30 cm Ao Asc diam:  3.50 cm MITRAL VALVE                TRICUSPID VALVE MV Area (PHT): 3.77 cm     TR Peak grad:   17.6 mmHg MV Decel Time: 201 msec     TR Vmax:        210.00 cm/s MV E velocity: 103.00 cm/s MV A velocity: 48.00 cm/s   SHUNTS MV E/A ratio:  2.15         Systemic VTI:  0.18 m                             Systemic Diam: 2.50 cm Buford Dresser MD Electronically signed by Buford Dresser MD Signature Date/Time: 12/07/2021/12:12:03 PM    Final    DG Chest Port 1 View  Result Date: 12/06/2021 CLINICAL DATA:  Shortness of breath. EXAM: PORTABLE CHEST 1 VIEW COMPARISON:  December 04, 2021 FINDINGS: Enlarged cardiac silhouette. Similar in appearance mixed pattern pulmonary edema and/or atelectasis. IMPRESSION: Similar in appearance mixed pattern pulmonary edema and/or atelectasis. Electronically Signed   By: Fidela Salisbury M.D.   On: 12/06/2021 13:43    DISCHARGE EXAMINATION: Vitals:   12/07/21 2112 12/08/21 0804 12/08/21 0829 12/08/21 0842  BP: 124/71   124/72  Pulse: 78   79  Resp: 19   13  Temp: 98.8 F (37.1 C)   98.9 F (37.2 C)  TempSrc:  Oral   Oral  SpO2: 96% 98% 95% 93%  Weight:      Height:       General appearance: Awake alert.  In no distress Resp: Clear to auscultation bilaterally.  Normal effort Cardio: S1-S2 is normal regular.  No S3-S4.  No rubs murmurs or bruit GI: Abdomen is  soft.  Nontender nondistended.  Bowel sounds are present normal.  No masses organomegaly   DISPOSITION: Home  Discharge Instructions     Ambulatory referral to Pulmonology   Complete by: As directed    OSA/OHS   Reason for referral: Other   Call MD for:  difficulty breathing, headache or visual disturbances   Complete by: As directed    Call MD for:  extreme fatigue   Complete by: As directed    Call MD for:  persistant dizziness or light-headedness   Complete by: As directed    Call MD for:  persistant nausea and vomiting   Complete by: As directed    Call MD for:  severe uncontrolled pain   Complete by: As directed    Call MD for:  temperature >100.4   Complete by: As directed    Diet - low sodium heart healthy   Complete by: As directed    Discharge instructions   Complete by: As directed    Referral has been sent to the lung doctors office.  You should hear from their office by the end of this week.  If you do not please reach out to your primary care provider to refer you again.  Take your medications as prescribed.  Please be compliant with your CPAP at home.  You were cared for by a hospitalist during your hospital stay. If you have any questions about your discharge medications or the care you received while you were in the hospital after you are discharged, you can call the unit and asked to speak with the hospitalist on call if the hospitalist that took care of you is not available. Once you are discharged, your primary care physician will handle any further medical issues. Please note that NO REFILLS for any discharge medications will be authorized once you are discharged, as it is imperative that you return to your  primary care physician (or establish a relationship with a primary care physician if you do not have one) for your aftercare needs so that they can reassess your need for medications and monitor your lab values. If you do not have a primary care physician, you can call [phone] for a physician referral.   Increase activity slowly   Complete by: As directed          Allergies as of 12/08/2021   No Known Allergies      Medication List     STOP taking these medications    doxycycline 100 MG tablet Commonly known as: VIBRA-TABS   nicotine 14 mg/24hr patch Commonly known as: NICODERM CQ - dosed in mg/24 hours   spironolactone 25 MG tablet Commonly known as: ALDACTONE       TAKE these medications    albuterol 108 (90 Base) MCG/ACT inhaler Commonly known as: VENTOLIN HFA Inhale 2 puffs into the lungs every 4 (four) hours as needed for wheezing or shortness of breath.   benzonatate 100 MG capsule Commonly known as: TESSALON Take 1 capsule (100 mg total) by mouth 3 (three) times daily as needed for cough.   budesonide 0.25 MG/2ML nebulizer solution Commonly known as: PULMICORT Take 2 mLs (0.25 mg total) by nebulization 2 (two) times daily.   ferrous sulfate 325 (65 FE) MG tablet Take 1 tablet (325 mg total) by mouth daily with breakfast.   furosemide 40 MG tablet Commonly known as: LASIX Take 1 tablet (40 mg total) by mouth daily.   ibuprofen 600 MG tablet Commonly known as: ADVIL  Take 600 mg by mouth every 6 (six) hours as needed for mild pain.   megestrol 40 MG tablet Commonly known as: MEGACE Take 1 tablet (40 mg total) by mouth 2 (two) times daily.   metFORMIN 500 MG tablet Commonly known as: Glucophage Take 1 tablet (500 mg total) by mouth daily with breakfast.   predniSONE 20 MG tablet Commonly known as: DELTASONE Take 3 tablets once daily for 3 days followed by 2 tablets once daily for 3 days followed by 1 tablet once daily for 3 days and then stop           Follow-up Information     [facility] Follow up.   Specialty: [facility]. Schedule an appointment as soon as possible for a visit.   Why: Please call and arrange appointment time. Ofiice closed on Friday. Contact information: Address: [place], [place], [place] [address] Phone: [phone]        [facility] Follow up.   Specialty: Pulmonology Why: Office will call to arrange appointment Contact information: [place] [id number] [phone]                TOTAL DISCHARGE TIME: 8 minutes  [place]  [facility] Pager on [email or web address]  12/09/2021, 11:36 AM

## 2021-12-08 NOTE — TOC Transition Note (Signed)
Transition of Care [facility]) - CM/SW Discharge Note   Patient Details  Name: Renee Paul MRN: [id number] Date of Birth: March 19, 1986  Transition of Care [facility]) CM/SW Contact:  Bartholomew Crews, RN Phone Number: [phone] 12/08/2021, 2:46 PM   Clinical Narrative:     Patient to transition home today. Noted on AVS that MD had placed weight management clinic info on AVS for patient to follow up. No other TOC needs identified at this time.   Final next level of care: Home/Self Care     Patient Goals and CMS Choice        Discharge Placement                       Discharge Plan and Services                                     Social Determinants of Health (SDOH) Interventions     Readmission Risk Interventions     No data to display

## 2021-12-08 NOTE — Plan of Care (Signed)
  Problem: Education: Goal: Knowledge of General Education information will improve Description: Including pain rating scale, medication(s)/side effects and non-pharmacologic comfort measures Outcome: Completed/Met   Problem: Health Behavior/Discharge Planning: Goal: Ability to manage health-related needs will improve Outcome: Completed/Met   Problem: Clinical Measurements: Goal: Ability to maintain clinical measurements within normal limits will improve Outcome: Completed/Met Goal: Will remain free from infection Outcome: Completed/Met Goal: Diagnostic test results will improve Outcome: Completed/Met Goal: Respiratory complications will improve Outcome: Completed/Met Goal: Cardiovascular complication will be avoided Outcome: Completed/Met   Problem: Activity: Goal: Risk for activity intolerance will decrease 12/08/2021 1606 by Shanon Rosser, RN Outcome: Completed/Met 12/08/2021 1604 by Shanon Rosser, RN Outcome: Progressing   Problem: Nutrition: Goal: Adequate nutrition will be maintained Outcome: Completed/Met   Problem: Coping: Goal: Level of anxiety will decrease Outcome: Completed/Met   Problem: Elimination: Goal: Will not experience complications related to bowel motility Outcome: Completed/Met Goal: Will not experience complications related to urinary retention Outcome: Completed/Met   Problem: Pain Managment: Goal: General experience of comfort will improve 12/08/2021 1606 by Shanon Rosser, RN Outcome: Completed/Met 12/08/2021 1604 by Shanon Rosser, RN Outcome: Progressing   Problem: Safety: Goal: Ability to remain free from injury will improve Outcome: Completed/Met   Problem: Skin Integrity: Goal: Risk for impaired skin integrity will decrease Outcome: Completed/Met

## 2021-12-08 NOTE — Plan of Care (Signed)
  Problem: Activity: Goal: Risk for activity intolerance will decrease Outcome: Progressing   Problem: Pain Managment: Goal: General experience of comfort will improve Outcome: Progressing   

## 2021-12-11 LAB — CULTURE, BLOOD (ROUTINE X 2)
Culture: NO GROWTH
Culture: NO GROWTH
Special Requests: ADEQUATE

## 2021-12-31 ENCOUNTER — Emergency Department (HOSPITAL_COMMUNITY)
Admission: EM | Admit: 2021-12-31 | Discharge: 2021-12-31 | Disposition: A | Discharge status: Home / Self Care | Payer: No Typology Code available for payment source | Source: Home / Self Care | Attending: Emergency Medicine | Admitting: Emergency Medicine

## 2021-12-31 ENCOUNTER — Emergency Department (HOSPITAL_COMMUNITY): Payer: No Typology Code available for payment source

## 2021-12-31 DIAGNOSIS — Z20822 Contact with and (suspected) exposure to covid-19: Secondary | ICD-10-CM | POA: Insufficient documentation

## 2021-12-31 DIAGNOSIS — R0602 Shortness of breath: Secondary | ICD-10-CM | POA: Diagnosis not present

## 2021-12-31 DIAGNOSIS — J810 Acute pulmonary edema: Secondary | ICD-10-CM

## 2021-12-31 DIAGNOSIS — I509 Heart failure, unspecified: Secondary | ICD-10-CM | POA: Insufficient documentation

## 2021-12-31 DIAGNOSIS — Z7951 Long term (current) use of inhaled steroids: Secondary | ICD-10-CM | POA: Insufficient documentation

## 2021-12-31 DIAGNOSIS — I5033 Acute on chronic diastolic (congestive) heart failure: Secondary | ICD-10-CM | POA: Diagnosis not present

## 2021-12-31 DIAGNOSIS — J129 Viral pneumonia, unspecified: Secondary | ICD-10-CM

## 2021-12-31 LAB — BASIC METABOLIC PANEL
Anion gap: 6 (ref 5–15)
BUN: 10 mg/dL (ref 6–20)
CO2: 35 mmol/L — ABNORMAL HIGH (ref 22–32)
Calcium: 8.2 mg/dL — ABNORMAL LOW (ref 8.9–10.3)
Chloride: 99 mmol/L (ref 98–111)
Creatinine, Ser: 0.85 mg/dL (ref 0.44–1.00)
GFR, Estimated: >60 mL/min (ref >60)
Glucose, Bld: 106 mg/dL — ABNORMAL HIGH (ref 70–99)
Potassium: 4.5 mmol/L (ref 3.5–5.1)
Sodium: 140 mmol/L (ref 135–145)

## 2021-12-31 LAB — SARS CORONAVIRUS 2 BY RT PCR: SARS Coronavirus 2 by RT PCR: NEGATIVE

## 2021-12-31 LAB — CBC WITH DIFFERENTIAL/PLATELET
Abs Immature Granulocytes: 0.02 10*3/uL (ref 0.00–0.07)
Basophils Absolute: 0 10*3/uL (ref 0.0–0.1)
Basophils Relative: 1 %
Eosinophils Absolute: 0.1 10*3/uL (ref 0.0–0.5)
Eosinophils Relative: 2 %
HCT: 43.6 % (ref 36.0–46.0)
Hemoglobin: 12.6 g/dL (ref 12.0–15.0)
Immature Granulocytes: 0 %
Lymphocytes Relative: 20 %
Lymphs Abs: 1.2 10*3/uL (ref 0.7–4.0)
MCH: 28.1 pg (ref 26.0–34.0)
MCHC: 28.9 g/dL — ABNORMAL LOW (ref 30.0–36.0)
MCV: 97.3 fL (ref 80.0–100.0)
Monocytes Absolute: 0.3 10*3/uL (ref 0.1–1.0)
Monocytes Relative: 5 %
Neutro Abs: 4.2 10*3/uL (ref 1.7–7.7)
Neutrophils Relative %: 72 %
Platelets: 115 10*3/uL — ABNORMAL LOW (ref 150–400)
RBC: 4.48 MIL/uL (ref 3.87–5.11)
RDW: 17.5 % — ABNORMAL HIGH (ref 11.5–15.5)
WBC: 5.9 10*3/uL (ref 4.0–10.5)
nRBC: 0 % (ref 0.0–0.2)

## 2021-12-31 LAB — TROPONIN I (HIGH SENSITIVITY)
Troponin I (High Sensitivity): 14 ng/L (ref <18)
Troponin I (High Sensitivity): 18 ng/L — ABNORMAL HIGH (ref <18)

## 2021-12-31 LAB — I-STAT VENOUS BLOOD GAS, ED
Acid-Base Excess: 3 mmol/L — ABNORMAL HIGH (ref 0.0–2.0)
Bicarbonate: 30 mmol/L — ABNORMAL HIGH (ref 20.0–28.0)
Calcium, Ion: 0.9 mmol/L — ABNORMAL LOW (ref 1.15–1.40)
HCT: 36 % (ref 36.0–46.0)
Hemoglobin: 12.2 g/dL (ref 12.0–15.0)
O2 Saturation: 93 %
Potassium: 3.4 mmol/L — ABNORMAL LOW (ref 3.5–5.1)
Sodium: 143 mmol/L (ref 135–145)
TCO2: 32 mmol/L (ref 22–32)
pCO2, Ven: 54.1 mmHg (ref 44–60)
pH, Ven: 7.352 (ref 7.25–7.43)
pO2, Ven: 72 mmHg — ABNORMAL HIGH (ref 32–45)

## 2021-12-31 LAB — BRAIN NATRIURETIC PEPTIDE: B Natriuretic Peptide: 90.6 pg/mL (ref 0.0–100.0)

## 2021-12-31 MED ORDER — FUROSEMIDE 40 MG PO TABS: 40.0000 mg | ORAL_TABLET | Freq: Every day | ORAL | 0 refills | Status: DC

## 2021-12-31 MED ORDER — AZITHROMYCIN 250 MG PO TABS: ORAL_TABLET | ORAL | 0 refills | Status: DC

## 2021-12-31 MED ORDER — FUROSEMIDE 10 MG/ML IJ SOLN
40.0000 mg | Freq: Once | INTRAMUSCULAR | Status: AC
  Administered 2021-12-31: 40 mg via INTRAVENOUS
  Filled 2021-12-31: qty 4

## 2021-12-31 NOTE — ED Provider Notes (Addendum)
[facility] DEPARTMENT Provider Note   CSN: [id number] Arrival date & time: 12/31/21  1122     History  Chief Complaint  Patient presents with   Shortness of Breath    Renee Paul is a 36 y.o. female.  36 year old female with history of CHF presents with worsening shortness of breath x24 hours.  Patient has been off her CHF medications for the past several weeks.  Has had some chest tightness associated with this with nonproductive cough.  No fever or chills.  Has had some orthopnea as well as dyspnea on exertion.  Has also had increased leg swelling       Home Medications Prior to Admission medications   Medication Sig Start Date End Date Taking? Authorizing Provider  albuterol (PROVENTIL HFA;VENTOLIN HFA) 108 (90 Base) MCG/ACT inhaler Inhale 2 puffs into the lungs every 4 (four) hours as needed for wheezing or shortness of breath. 09/06/16   Orpah Greek, MD  benzonatate (TESSALON) 100 MG capsule Take 1 capsule (100 mg total) by mouth 3 (three) times daily as needed for cough. 11/06/21   Eulogio Bear U, DO  budesonide (PULMICORT) 0.25 MG/2ML nebulizer solution Take 2 mLs (0.25 mg total) by nebulization 2 (two) times daily. 11/06/21   Geradine Girt, DO  ferrous sulfate 325 (65 FE) MG tablet Take 1 tablet (325 mg total) by mouth daily with breakfast. 06/10/21   Patrecia Pour, MD  furosemide (LASIX) 40 MG tablet Take 1 tablet (40 mg total) by mouth daily. 11/08/21   Fransico Meadow, MD  ibuprofen (ADVIL) 600 MG tablet Take 600 mg by mouth every 6 (six) hours as needed for mild pain.    [provider]  megestrol (MEGACE) 40 MG tablet Take 1 tablet (40 mg total) by mouth 2 (two) times daily. 06/10/21   Patrecia Pour, MD  metFORMIN (GLUCOPHAGE) 500 MG tablet Take 1 tablet (500 mg total) by mouth daily with breakfast. Patient not taking: Reported on 12/06/2021 11/09/21 11/09/22  Geradine Girt, DO  predniSONE (DELTASONE) 20 MG tablet  Take 3 tablets once daily for 3 days followed by 2 tablets once daily for 3 days followed by 1 tablet once daily for 3 days and then stop 12/08/21   Bonnielee Haff, MD      Allergies    Patient has no known allergies.    Review of Systems   Review of Systems  All other systems reviewed and are negative.   Physical Exam Updated Vital Signs BP 128/84 (BP Location: Left Arm)   Pulse 92   Temp 99.1 F (37.3 C) (Axillary)   Resp 16   SpO2 94%  Physical Exam Vitals and nursing note reviewed.  Constitutional:      General: She is not in acute distress.    Appearance: Normal appearance. She is well-developed. She is not toxic-appearing.  HENT:     Head: Normocephalic and atraumatic.  Eyes:     General: Lids are normal.     Conjunctiva/sclera: Conjunctivae normal.     Pupils: Pupils are equal, round, and reactive to light.  Neck:     Thyroid: No thyroid mass.     Trachea: No tracheal deviation.  Cardiovascular:     Rate and Rhythm: Normal rate and regular rhythm.     Heart sounds: Normal heart sounds. No murmur heard.    No gallop.  Pulmonary:     Effort: Tachypnea and respiratory distress present.     Breath  sounds: No stridor. Examination of the right-upper field reveals decreased breath sounds and rhonchi. Examination of the left-upper field reveals decreased breath sounds and rhonchi. Decreased breath sounds and rhonchi present. No wheezing or rales.  Abdominal:     General: There is no distension.     Palpations: Abdomen is soft.     Tenderness: There is no abdominal tenderness. There is no rebound.  Musculoskeletal:        General: No tenderness. Normal range of motion.     Cervical back: Normal range of motion and neck supple.  Skin:    General: Skin is warm and dry.     Findings: No abrasion or rash.  Neurological:     Mental Status: She is alert and oriented to person, place, and time. Mental status is at baseline.     GCS: GCS eye subscore is 4. GCS verbal  subscore is 5. GCS motor subscore is 6.     Cranial Nerves: No cranial nerve deficit.     Sensory: No sensory deficit.     Motor: Motor function is intact.  Psychiatric:        Attention and Perception: Attention normal.        Speech: Speech normal.        Behavior: Behavior normal.     ED Results / Procedures / Treatments   Labs (all labs ordered are listed, but only abnormal results are displayed) Labs Reviewed  CBC WITH DIFFERENTIAL/PLATELET  BRAIN NATRIURETIC PEPTIDE  BASIC METABOLIC PANEL  TROPONIN I (HIGH SENSITIVITY)    EKG EKG Interpretation  Date/Time:  Monday December 31 2021 11:38:00 EDT Ventricular Rate:  94 PR Interval:  154 QRS Duration: 75 QT Interval:  374 QTC Calculation: 468 R Axis:   87 Text Interpretation: Sinus rhythm Consider right atrial enlargement Confirmed by Lacretia Leigh ([id number]) on 12/31/2021 11:46:59 AM  Radiology No results found.  Procedures Procedures    Medications Ordered in ED Medications - No data to display  ED Course/ Medical Decision Making/ A&P                           Medical Decision Making Amount and/or Complexity of Data Reviewed Labs: ordered. Radiology: ordered.  Risk Prescription drug management.   Patient is EKG shows normal sinus rhythm.  No signs of acute coronary ischemia.  Patient's chest x-ray consistent with CHF.  Patient treated with IV Lasix here.  Patient presented with BiPAP and oxygenation improved.  Patient reassessed and will attempt to wean to nasal cannula.  Patient's labs are pending at this time will be admitted by [place] Performed by: Leota Jacobsen Total critical care time: 50 minutes Critical care time was exclusive of separately billable procedures and treating other patients. Critical care was necessary to treat or prevent imminent or life-threatening deterioration. Critical care was time spent personally by me on the following activities: development of treatment plan  with patient and/or surrogate as well as nursing, discussions with consultants, evaluation of patient's response to treatment, examination of patient, obtaining history from patient or surrogate, ordering and performing treatments and interventions, ordering and review of laboratory studies, ordering and review of radiographic studies, pulse oximetry and re-evaluation of patient's condition.         Final Clinical Impression(s) / ED Diagnoses Final diagnoses:  None    Rx / DC Orders ED Discharge Orders     None  Lacretia Leigh, MD 12/31/21 1525    Lacretia Leigh, MD 12/31/21 [phone]

## 2021-12-31 NOTE — Discharge Instructions (Addendum)
Take Z-Pak as prescribed.  I have refilled your Lasix 40 mg daily and please take it as prescribed.  As we discussed, I would prefer that you stay in the hospital but you decided to go home.  You need to follow-up with your primary care doctor and cardiologist this week  Return to ER if you have worse shortness of breath, leg swelling, fever

## 2021-12-31 NOTE — ED Notes (Signed)
DC instructions reviewed with pt. PT verbalized understanding. PT DC °

## 2021-12-31 NOTE — ED Notes (Signed)
I entered room at approx. 1605 and pt desparately needed to void and had taken off Bipap mask and was satting 88%.  We quickly got her up, got her pants off, and placed a purewick.  She was at 77% RA.  I placed her on 6L [place] and got her up to 98%.    I have since weaned her back to 3L and she dropped to 91%.  Currently she is 95% on 4L.

## 2021-12-31 NOTE — ED Triage Notes (Signed)
Pt BIB [facility] from work d/t SOB. Hx of CHF. Been out of lasix for a month. Sat in the 60s upon EMS on RA. Pt placed on Ci-pap by EMS.  1 nitro given EMS.   150/90 HR 90 NSR

## 2021-12-31 NOTE — ED Provider Notes (Signed)
  Physical Exam  BP 132/72   Pulse 92   Temp 98.6 F (37 C) (Oral)   Resp 20   SpO2 93%   Physical Exam  Procedures  Procedures  ED Course / MDM    Medical Decision Making Care assumed at 3 PM from Dr. Zenia Resides.  Patient is on 3 L nasal cannula at baseline.  Patient is obese.  Patient ran out of her Lasix for a week.  Patient was briefly on BiPAP and did not tolerate it well and went back down to 3 L nasal cannula.  Patient was supposed to get admitted and signed out pending VBG and COVID test.  However the nurse told me that patient really wants to be discharged.  5:55 PM Reviewed patient's VBG on 3 L nasal cannula and it is reassuring.  COVID test is negative.  Chest x-ray showed likely vascular congestion versus atypical pneumonia.  I again offered admission to the patient but patient again refused.  She states that she has a lot of work she needs to do tomorrow and does not want to stay in the hospital.  Patient urinated about 1 L after given Lasix.  Patient requests refill of her Lasix and again wants to go home.  I discussed the risks of leaving including worsening shortness of breath and hypoxiaand disability and death and she still wants to go home.  We will refill her Lasix and discharge her on Z-Pak  Problems Addressed: Acute pulmonary edema ([place]): acute illness or injury Viral pneumonia: acute illness or injury  Amount and/or Complexity of Data Reviewed Labs: ordered. Decision-making details documented in ED Course. Radiology: ordered and independent interpretation performed. Decision-making details documented in ED Course.  Risk Prescription drug management.          Drenda Freeze, MD 12/31/21 [phone]

## 2021-12-31 NOTE — ED Notes (Signed)
PT very much wants to go home.  I have explained that we are checking VBG and Covid and that we would prob like her back to 3L baseline with a good bloodgas at least before they consider sending her home.

## 2022-01-02 ENCOUNTER — Inpatient Hospital Stay (HOSPITAL_BASED_OUTPATIENT_CLINIC_OR_DEPARTMENT_OTHER)
Admission: EM | Admit: 2022-01-02 | Discharge: 2022-01-04 | DRG: 291 | Disposition: A | Discharge status: Home / Self Care | Payer: No Typology Code available for payment source | Attending: Internal Medicine | Admitting: Internal Medicine

## 2022-01-02 ENCOUNTER — Encounter (HOSPITAL_COMMUNITY): Payer: Self-pay

## 2022-01-02 ENCOUNTER — Encounter (HOSPITAL_BASED_OUTPATIENT_CLINIC_OR_DEPARTMENT_OTHER): Payer: Self-pay

## 2022-01-02 ENCOUNTER — Emergency Department (HOSPITAL_BASED_OUTPATIENT_CLINIC_OR_DEPARTMENT_OTHER): Payer: No Typology Code available for payment source

## 2022-01-02 DIAGNOSIS — Z6841 Body Mass Index (BMI) 40.0 and over, adult: Secondary | ICD-10-CM | POA: Diagnosis not present

## 2022-01-02 DIAGNOSIS — F17210 Nicotine dependence, cigarettes, uncomplicated: Secondary | ICD-10-CM | POA: Diagnosis present

## 2022-01-02 DIAGNOSIS — Z79899 Other long term (current) drug therapy: Secondary | ICD-10-CM | POA: Diagnosis not present

## 2022-01-02 DIAGNOSIS — J9622 Acute and chronic respiratory failure with hypercapnia: Secondary | ICD-10-CM | POA: Diagnosis present

## 2022-01-02 DIAGNOSIS — J45909 Unspecified asthma, uncomplicated: Secondary | ICD-10-CM | POA: Diagnosis present

## 2022-01-02 DIAGNOSIS — E662 Morbid (severe) obesity with alveolar hypoventilation: Secondary | ICD-10-CM | POA: Diagnosis present

## 2022-01-02 DIAGNOSIS — Z1152 Encounter for screening for COVID-19: Secondary | ICD-10-CM

## 2022-01-02 DIAGNOSIS — Z7984 Long term (current) use of oral hypoglycemic drugs: Secondary | ICD-10-CM

## 2022-01-02 DIAGNOSIS — J9621 Acute and chronic respiratory failure with hypoxia: Secondary | ICD-10-CM | POA: Diagnosis present

## 2022-01-02 DIAGNOSIS — I5033 Acute on chronic diastolic (congestive) heart failure: Principal | ICD-10-CM | POA: Diagnosis present

## 2022-01-02 DIAGNOSIS — R0602 Shortness of breath: Secondary | ICD-10-CM | POA: Diagnosis present

## 2022-01-02 DIAGNOSIS — G4733 Obstructive sleep apnea (adult) (pediatric): Secondary | ICD-10-CM | POA: Diagnosis present

## 2022-01-02 DIAGNOSIS — E119 Type 2 diabetes mellitus without complications: Secondary | ICD-10-CM | POA: Diagnosis present

## 2022-01-02 DIAGNOSIS — Z91148 Patient's other noncompliance with medication regimen for other reason: Secondary | ICD-10-CM

## 2022-01-02 DIAGNOSIS — Z7951 Long term (current) use of inhaled steroids: Secondary | ICD-10-CM | POA: Diagnosis not present

## 2022-01-02 HISTORY — DX: Type 2 diabetes mellitus without complications: E11.9

## 2022-01-02 LAB — COMPREHENSIVE METABOLIC PANEL
ALT: 20 U/L (ref 0–44)
AST: 20 U/L (ref 15–41)
Albumin: 3.2 g/dL — ABNORMAL LOW (ref 3.5–5.0)
Alkaline Phosphatase: 55 U/L (ref 38–126)
Anion gap: 9 (ref 5–15)
BUN: 12 mg/dL (ref 6–20)
CO2: 32 mmol/L (ref 22–32)
Calcium: 8.5 mg/dL — ABNORMAL LOW (ref 8.9–10.3)
Chloride: 98 mmol/L (ref 98–111)
Creatinine, Ser: 0.85 mg/dL (ref 0.44–1.00)
GFR, Estimated: >60 mL/min (ref >60)
Glucose, Bld: 178 mg/dL — ABNORMAL HIGH (ref 70–99)
Potassium: 4.5 mmol/L (ref 3.5–5.1)
Sodium: 139 mmol/L (ref 135–145)
Total Bilirubin: 1 mg/dL (ref 0.3–1.2)
Total Protein: 7.8 g/dL (ref 6.5–8.1)

## 2022-01-02 LAB — CBC WITH DIFFERENTIAL/PLATELET
Abs Immature Granulocytes: 0.01 10*3/uL (ref 0.00–0.07)
Basophils Absolute: 0 10*3/uL (ref 0.0–0.1)
Basophils Relative: 0 %
Eosinophils Absolute: 0.1 10*3/uL (ref 0.0–0.5)
Eosinophils Relative: 1 %
HCT: 45.9 % (ref 36.0–46.0)
Hemoglobin: 13 g/dL (ref 12.0–15.0)
Immature Granulocytes: 0 %
Lymphocytes Relative: 18 %
Lymphs Abs: 1.1 10*3/uL (ref 0.7–4.0)
MCH: 27.4 pg (ref 26.0–34.0)
MCHC: 28.3 g/dL — ABNORMAL LOW (ref 30.0–36.0)
MCV: 96.6 fL (ref 80.0–100.0)
Monocytes Absolute: 0.3 10*3/uL (ref 0.1–1.0)
Monocytes Relative: 5 %
Neutro Abs: 4.6 10*3/uL (ref 1.7–7.7)
Neutrophils Relative %: 76 %
Platelets: 149 10*3/uL — ABNORMAL LOW (ref 150–400)
RBC: 4.75 MIL/uL (ref 3.87–5.11)
RDW: 17.6 % — ABNORMAL HIGH (ref 11.5–15.5)
WBC: 6.1 10*3/uL (ref 4.0–10.5)
nRBC: 0 % (ref 0.0–0.2)

## 2022-01-02 LAB — TROPONIN I (HIGH SENSITIVITY)
Troponin I (High Sensitivity): 13 ng/L (ref <18)
Troponin I (High Sensitivity): 14 ng/L (ref <18)

## 2022-01-02 LAB — SARS CORONAVIRUS 2 BY RT PCR: SARS Coronavirus 2 by RT PCR: NEGATIVE

## 2022-01-02 LAB — BRAIN NATRIURETIC PEPTIDE: B Natriuretic Peptide: 111.8 pg/mL — ABNORMAL HIGH (ref 0.0–100.0)

## 2022-01-02 MED ORDER — INSULIN ASPART 100 UNIT/ML IJ SOLN: 0.0000 [IU] | Freq: Three times a day (TID) | INTRAMUSCULAR | Status: DC

## 2022-01-02 MED ORDER — ACETAMINOPHEN 650 MG RE SUPP: 650.0000 mg | Freq: Four times a day (QID) | RECTAL | Status: DC | PRN

## 2022-01-02 MED ORDER — FUROSEMIDE 10 MG/ML IJ SOLN
40.0000 mg | Freq: Once | INTRAMUSCULAR | Status: AC
  Administered 2022-01-02: 40 mg via INTRAVENOUS
  Filled 2022-01-02: qty 4

## 2022-01-02 MED ORDER — ALBUTEROL SULFATE (2.5 MG/3ML) 0.083% IN NEBU: 2.5000 mg | INHALATION_SOLUTION | RESPIRATORY_TRACT | Status: DC | PRN

## 2022-01-02 MED ORDER — MELATONIN 3 MG PO TABS
3.0000 mg | ORAL_TABLET | Freq: Once | ORAL | Status: AC
  Administered 2022-01-02: 3 mg via ORAL
  Filled 2022-01-02: qty 1

## 2022-01-02 MED ORDER — SODIUM CHLORIDE 0.9% FLUSH
3.0000 mL | Freq: Two times a day (BID) | INTRAVENOUS | Status: DC
  Administered 2022-01-02 – 2022-01-04 (×4): 3 mL via INTRAVENOUS

## 2022-01-02 MED ORDER — ENOXAPARIN SODIUM 120 MG/0.8ML IJ SOSY
110.0000 mg | PREFILLED_SYRINGE | INTRAMUSCULAR | Status: DC
  Administered 2022-01-02 – 2022-01-03 (×2): 110 mg via SUBCUTANEOUS
  Filled 2022-01-02 (×2): qty 0.74

## 2022-01-02 MED ORDER — ONDANSETRON HCL 4 MG/2ML IJ SOLN: 4.0000 mg | Freq: Four times a day (QID) | INTRAMUSCULAR | Status: DC | PRN

## 2022-01-02 MED ORDER — SENNOSIDES-DOCUSATE SODIUM 8.6-50 MG PO TABS: 1.0000 | ORAL_TABLET | Freq: Every evening | ORAL | Status: DC | PRN

## 2022-01-02 MED ORDER — ONDANSETRON HCL 4 MG PO TABS: 4.0000 mg | ORAL_TABLET | Freq: Four times a day (QID) | ORAL | Status: DC | PRN

## 2022-01-02 MED ORDER — ACETAMINOPHEN 325 MG PO TABS
650.0000 mg | ORAL_TABLET | Freq: Four times a day (QID) | ORAL | Status: DC | PRN
  Administered 2022-01-03 (×2): 650 mg via ORAL
  Filled 2022-01-02 (×2): qty 2

## 2022-01-02 MED ORDER — BUDESONIDE 0.25 MG/2ML IN SUSP
0.2500 mg | Freq: Two times a day (BID) | RESPIRATORY_TRACT | Status: DC
  Administered 2022-01-03 – 2022-01-04 (×3): 0.25 mg via RESPIRATORY_TRACT
  Filled 2022-01-02 (×3): qty 2

## 2022-01-02 MED ORDER — FUROSEMIDE 10 MG/ML IJ SOLN
40.0000 mg | Freq: Two times a day (BID) | INTRAMUSCULAR | Status: DC
  Administered 2022-01-02 – 2022-01-04 (×4): 40 mg via INTRAVENOUS
  Filled 2022-01-02 (×4): qty 4

## 2022-01-02 NOTE — Progress Notes (Signed)
MD notified of critical result on vbg

## 2022-01-02 NOTE — Assessment & Plan Note (Addendum)
Stable, no wheezing on admit.

## 2022-01-02 NOTE — Progress Notes (Signed)
Patient expresses interest in nocturnal CPAP/BiPAP, including at home. However, she is not compliant at this time due to anxiety issues and her inability to wear any facial apparatus that has been tried previously, including nasal. RT will continue to follow and encourage use.

## 2022-01-02 NOTE — Assessment & Plan Note (Addendum)
Multifactorial related to acute on chronic CHF and morbid obesity associated with OSA/OHS complicated by treatment nonadherence.  SPO2 53% on RA on ED arrival, stable on 3 L O2 via [place] while at rest. -Continue diuresis as above -Portable oxygen ordered at discharge

## 2022-01-02 NOTE — Assessment & Plan Note (Addendum)
Appears to be new diagnosis with hemoglobin A1c 7.5% on 12/17/2021.  Her PCP prescribed Ozempic but she is hesitant to start due to concern for potential side effects. We discussed further after admission; patient more willing to trial med at discharge -Patient advised on lifestyle/dietary adjustments

## 2022-01-02 NOTE — ED Notes (Signed)
[facility] Transport Team at bedside 

## 2022-01-02 NOTE — ED Notes (Signed)
Phone Handoff Report given to [facility] Transport Team 

## 2022-01-02 NOTE — Assessment & Plan Note (Signed)
Calculated BMI 81.55.  Patient advised dietary changes.  Recently prescribed Ozempic by her PCP but has not started.

## 2022-01-02 NOTE — ED Notes (Signed)
Presents with vague chest pain, pressure across chest, was recently DC from hospital for acute Pulmonary Edema. Also had sever Shortness of Breath upon arrival with POX on RA at 47% in triage area, immediately placed in exam room, NRB initiated, POX increase 100%, slow decreased oxygen administration to 4lpm via [place] at 97% on [place].

## 2022-01-02 NOTE — Assessment & Plan Note (Addendum)
Recent echo 12/07/2021 showed EF 60-65%.  BNP slightly elevated at 111.8 although patient morbidly obese -Responded well to IV Lasix in the hospital - Patient continued on oral Lasix at discharge with parameters to take second dose if/when develops worsening edema or weight gain.  Patient voiced understanding of instructions

## 2022-01-02 NOTE — Progress Notes (Signed)
Plan of Care Note for accepted transfer   Patient: Renee Paul MRN: [id number]   DOA: 01/02/2022  Facility requesting transfer: [facility] Requesting Provider: Dr. 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 Presenting with dyspnea. Sats found to be in the 50%. Initially on NRB, but able to wean to 4L. On exam she appears volume overloaded. Started on lasix.   Plan of care: The patient is accepted for admission to Progressive unit, at [facility]..  While holding at [facility], medical decision making responsibilities remain with the EDP. Upon arrival to [facility], [facility] will assume care. Thank you.   Author: Jonnie Finner, DO 01/02/2022  Check [email or web address] for on-call coverage.  Nursing staff, Please call [place] number on Amion as soon as patient's arrival, so appropriate admitting provider can evaluate the pt.

## 2022-01-02 NOTE — Progress Notes (Signed)
Patient answered part of the questions on the nursing admission history and then refused to answer any more of them. Pt's nurse notified. Doroteo Bradford BSN, RN-BC Throughput Nurse 01/02/2022 7:05 PM

## 2022-01-02 NOTE — ED Provider Notes (Signed)
[name] EMERGENCY DEPARTMENT Provider Note   CSN: [id number] Arrival date & time: 01/02/22  8756     History Chief Complaint  Patient presents with   Shortness of Breath    HPI Renee Paul is a 36 y.o. female presenting for acute on chronic shortness of breath.  She endorses a history of heart failure, she was recently in the hospital in the emergency room diagnosed with acute pulmonary edema on BiPAP.  Since she left, she was unable to pick up her Lasix and as a result her symptoms have continued to worsen since she left the hospital.  She is currently unable to ambulate around the house feels too short of breath to make from her car to her desk she states her baseline is able to make it around short durations before she becomes short of breath.  Patient's recorded medical, surgical, social, medication list and allergies were reviewed in the Snapshot window as part of the initial history.   Review of Systems   Review of Systems  Constitutional:  Positive for fatigue and unexpected weight change. Negative for chills and fever.  HENT:  Negative for ear pain and sore throat.   Eyes:  Negative for pain and visual disturbance.  Respiratory:  Positive for cough and shortness of breath.   Cardiovascular:  Negative for chest pain and palpitations.  Gastrointestinal:  Negative for abdominal pain and vomiting.  Genitourinary:  Negative for dysuria and hematuria.  Musculoskeletal:  Negative for arthralgias and back pain.  Skin:  Negative for color change and rash.  Neurological:  Negative for seizures and syncope.  All other systems reviewed and are negative.   Physical Exam Updated Vital Signs BP (!) 139/90   Pulse (!) 102   Temp 98.3 F (36.8 C) (Oral)   Resp 14   Ht '5\' 4"'$  (1.626 m)   Wt (!) 215.5 kg   SpO2 98%   BMI 81.55 kg/m  Physical Exam Vitals and nursing note reviewed.  Constitutional:      General: She is not in acute distress.    Appearance: She  is well-developed.  HENT:     Head: Normocephalic and atraumatic.  Eyes:     Conjunctiva/sclera: Conjunctivae normal.  Cardiovascular:     Rate and Rhythm: Normal rate and regular rhythm.     Heart sounds: No murmur heard. Pulmonary:     Effort: Pulmonary effort is normal. No respiratory distress.     Breath sounds: Normal breath sounds.  Abdominal:     General: There is no distension.     Palpations: Abdomen is soft.     Tenderness: There is no abdominal tenderness. There is no right CVA tenderness or left CVA tenderness.  Musculoskeletal:        General: No swelling or tenderness. Normal range of motion.     Cervical back: Neck supple.  Skin:    General: Skin is warm and dry.  Neurological:     General: No focal deficit present.     Mental Status: She is alert and oriented to person, place, and time. Mental status is at baseline.     Cranial Nerves: No cranial nerve deficit.      ED Course/ Medical Decision Making/ A&P    Procedures Procedures   Medications Ordered in ED Medications  furosemide (LASIX) injection 40 mg (40 mg Intravenous Given 01/02/22 1248)    Medical Decision Making:    Renee Paul is a 36 y.o. female who presented to the  ED today with SOB detailed above.     Patient's presentation is complicated by their history of heart failure, morbid obesity, multiple other comorbid medical problems.  Medication noncompliance in the outpatient setting complicating her care.  Patient placed on continuous vitals and telemetry monitoring while in ED which was reviewed periodically.   Complete initial physical exam performed, notably the patient  was hemodynamically stable in no acute distress.  She is tachypneic with oxygen saturations of 56% on room air.  She is placement 2 L at rest only while asleep.  Initiated on nonrebreather with only mild symptomatic improvement.      Reviewed and confirmed nursing documentation for past medical history, family history,  social history.    Initial Assessment:   Patient's history present on his physical exam is most consistent with recurrent hypoxic respiratory failure likely secondary to heart failure exacerbation.  This is likely from her medication noncompliance, multiple comorbid medical problems.  Initiated patient on Lasix with some appropriate response to therapy.  Required extensive oxygen treatment in emergency department 4 L nasal cannula with some symptomatic improvement.  There may be underlying obesity hypoventilation, however seems unlikely to cause such severe decompensation of her oxygen requirement.  Consider CTA PE study, however patient had one recently that was nondiagnostic.  Ultimately, will admit patient to main campus for ongoing care and management.  Communicated with hospitalist who agreed with need for admission, continue diuresis.  Patient admitted to local facility with no further acute events pending bed placement at this time. Clinical Impression:  1. SOB (shortness of breath)      Admit   Final Clinical Impression(s) / ED Diagnoses Final diagnoses:  SOB (shortness of breath)    Rx / DC Orders ED Discharge Orders     None         Tretha Sciara, MD 01/02/22 1531

## 2022-01-02 NOTE — ED Notes (Signed)
Phone Handoff Report provided to rec RN at [facility] 4th Floor

## 2022-01-02 NOTE — ED Triage Notes (Signed)
Pt was seen at [facility] a few days ago. C/o SOB & CP SpO2 53% on RA at arrival, placed on NRB with improvement to 100%.

## 2022-01-02 NOTE — Assessment & Plan Note (Addendum)
Presumed OSA/OHS nonadherent to CPAP due to living situation.

## 2022-01-02 NOTE — H&P (Signed)
History and Physical    Renee Paul WEX:[id number] DOB: 1985/08/07 DOA: 01/02/2022  PCP: [facility]  Patient coming from: Home  I have personally briefly reviewed patient's old medical records in [place]  Chief Complaint: Short of breath  HPI: Renee Paul is a 36 y.o. female with medical history significant for chronic diastolic CHF, chronic respiratory failure with hypoxia on 2 L O2 at nighttime, morbid obesity, presumed OSA/OHS nonadherent to CPAP who presented to the ED for evaluation of shortness of breath.  Patient frequently admitted worsening in the ED for acute on chronic hypoxic respiratory failure.  Most recently seen in the ED on 12/31/2021 for shortness of breath due to pulmonary edema in setting of medication nonadherence.  She was placed on BiPAP briefly and given IV Lasix with improvement.  She declined admission and was discharged to home with refills for Lasix and prescription for Z-Pak.  Patient returns with recurrent exertional shortness of breath.  She says she was unable to pick up her Lasix to the pharmacy being closed.  She says she becomes very short of breath with ambulation.  She does not have a portable oxygen tank and believes she would benefit from a due to her recurrent issues with hypoxia on ambulation.  She has had a cough occasionally productive of clear sputum.  She reports some chest tightness during episodes of significant dyspnea.  She has chronically large lower extremities, right more so than left, and she does not see any significant increase in size recently.  She states that she and her family have been dealing with homelessness over the last year but are currently residing with a family member with all members of the family sleeping in one room.  She says she does have a CPAP but has not been using it due to no space in her current living situation.  She says she is scheduled to see a pulmonologist on  01/16/2022.  She reports tobacco use but is working on cutting back.  She also reports significant dietary indiscretion, primarily with sugary drinks.  Central ED Course  Labs/Imaging on admission: I have personally reviewed following labs and imaging studies.  Initial vitals showed BP 133/89, pulse 103, RR 31, temp 98.5 F, SPO2 53% on room air per ED triage documentation.  Patient placed on NRB with SPO2 100% and subsequently weaned to 4 L O2 via [place].  Labs show WBC 6.1, hemoglobin 13.0, platelets 149,000, sodium 139, potassium 4.5, bicarb 32, BUN 12, creatinine 0.5, serum glucose 178, BNP 111.8, troponin 13 > 14.  SARS-CoV-2 PCR negative.  Portable chest x-ray limited due to obesity.  Stable prominent perihilar bronchovascular structures noted.  No focal consolidation.  Patient was given IV Lasix 40 mg once and the hospitalist service was consulted to admit for further evaluation and management.  Review of Systems: All systems reviewed and are negative except as documented in history of present illness above.   Past Medical History:  Diagnosis Date   Asthma    CHF (congestive heart failure) ([place])    Morbid obesity ([place])    Type 2 diabetes mellitus ([place]) 01/02/2022    Past Surgical History:  Procedure Laterality Date   CESAREAN SECTION     CESAREAN SECTION     CHOLECYSTECTOMY     INTRAUTERINE DEVICE INSERTION     IUD REMOVAL     TUBAL LIGATION      Social History:  reports that she has been smoking  cigarettes. She has a 9.00 pack-year smoking history. She has never used smokeless tobacco. She reports current alcohol use of about 8.0 standard drinks of alcohol per week. She reports that she does not use drugs.  No Known Allergies  History reviewed. No pertinent family history.   Prior to Admission medications   Medication Sig Start Date End Date Taking? Authorizing Provider  albuterol (PROVENTIL HFA;VENTOLIN HFA) 108 (90 Base) MCG/ACT inhaler Inhale 2  puffs into the lungs every 4 (four) hours as needed for wheezing or shortness of breath. 09/06/16   Orpah Greek, MD  azithromycin (ZITHROMAX Z-PAK) 250 MG tablet 2 po day one, then 1 daily x 4 days 12/31/21   Drenda Freeze, MD  benzonatate (TESSALON) 100 MG capsule Take 1 capsule (100 mg total) by mouth 3 (three) times daily as needed for cough. Patient not taking: Reported on 12/31/2021 11/06/21   Eulogio Bear U, DO  budesonide (PULMICORT) 0.25 MG/2ML nebulizer solution Take 2 mLs (0.25 mg total) by nebulization 2 (two) times daily. Patient not taking: Reported on 12/31/2021 11/06/21   Geradine Girt, DO  furosemide (LASIX) 40 MG tablet Take 1 tablet (40 mg total) by mouth daily. 12/31/21   Drenda Freeze, MD  ibuprofen (ADVIL) 600 MG tablet Take 600 mg by mouth every 6 (six) hours as needed for mild pain.    [provider]  megestrol (MEGACE) 40 MG tablet Take 1 tablet (40 mg total) by mouth 2 (two) times daily. 06/10/21   Patrecia Pour, MD  metFORMIN (GLUCOPHAGE) 500 MG tablet Take 1 tablet (500 mg total) by mouth daily with breakfast. Patient not taking: Reported on 12/06/2021 11/09/21 11/09/22  Geradine Girt, DO  predniSONE (DELTASONE) 20 MG tablet Take 3 tablets once daily for 3 days followed by 2 tablets once daily for 3 days followed by 1 tablet once daily for 3 days and then stop Patient not taking: Reported on 12/31/2021 12/08/21   Bonnielee Haff, MD    Physical Exam: Vitals:   01/02/22 1600 01/02/22 1700 01/02/22 1732 01/02/22 1832  BP: (!) 129/91 (!) 129/94  135/87  Pulse: 86 76  89  Resp: (!) '21 15  18  '$ Temp:   98.4 F (36.9 C) 98.4 F (36.9 C)  TempSrc:   Oral Oral  SpO2: 93% 91%  92%  Weight:      Height:       Constitutional: Morbidly obese woman resting in bed with head elevated, NAD, calm, comfortable Eyes: EOMI, lids and conjunctivae normal ENMT: Mucous membranes are moist. Posterior pharynx clear of any exudate or lesions.Normal dentition.   Neck: normal, supple, no masses. Respiratory: Faint bibasilar inspiratory crackles.  Normal respiratory effort while on 3 L O2 via [place]. No accessory muscle use.  Cardiovascular: Regular rate and rhythm, no murmurs / rubs / gallops.  Morbidly obese extremities without significant pitting edema. 2+ pedal pulses. Abdomen: no tenderness, no masses palpated. Musculoskeletal: no clubbing / cyanosis. No joint deformity upper and lower extremities. Good ROM, no contractures. Normal muscle tone.  Skin: no rashes, lesions, ulcers. No induration Neurologic: Sensation intact. Strength 5/5 in all 4.  Psychiatric: Alert and oriented x 3. Normal mood.   EKG: Personally reviewed. Sinus tachycardia, rate 103, LAE, no acute ischemic changes.  Similar to prior.  Assessment/Plan Principal Problem:   Acute on chronic respiratory failure with hypoxia (HCC) Active Problems:   Acute on chronic diastolic CHF (congestive heart failure) (HCC)   OSA (obstructive sleep apnea)  Type 2 diabetes mellitus (HCC)   Asthma   Class 3 obesity (HCC)   Renee Paul is a 36 y.o. female with medical history significant for chronic diastolic CHF, chronic respiratory failure with hypoxia on 2 L O2 at nighttime, morbid obesity, presumed OSA/OHS nonadherent to CPAP who is admitted with acute on chronic respiratory failure with hypoxia in setting of acute on chronic diastolic CHF.  Assessment and Plan: * Acute on chronic respiratory failure with hypoxia (HCC) Multifactorial related to acute on chronic CHF and morbid obesity associated with OSA/OHS complicated by treatment nonadherence.  SPO2 53% on RA on ED arrival, stable on 3 L O2 via [place] while at rest. -Continue diuresis as above -Continue CPAP nightly -Would likely benefit from portable O2, desat evaluation prior to discharge -Follow-up with pulmonology outpatient  Acute on chronic diastolic CHF (congestive heart failure) ([place]) Recent echo 12/07/2021 showed EF 60-65%.  BNP  slightly elevated at 111.8 although patient morbidly obese.  Has not been able to pick up Lasix for home use. -Continue IV Lasix 40 mg twice daily -Monitor strict I/O's and daily weights -Continue supplemental O2 and wean as able  OSA (obstructive sleep apnea) Presumed OSA/OHS nonadherent to CPAP due to living situation. -Continue CPAP nightly  Type 2 diabetes mellitus ([place]) Appears to be new diagnosis with hemoglobin A1c 7.5% on 12/17/2021.  Her PCP prescribed Ozempic but she is hesitant to start due to concern for potential side effects. -Place on SSI -Patient advised on lifestyle/dietary adjustments  Class 3 obesity (HCC) Calculated BMI 81.55.  Patient advised dietary changes.  Recently prescribed Ozempic by her PCP but has not started.  Asthma Stable, no wheezing on admit.  Continue Pulmicort and albuterol as needed.  DVT prophylaxis: Lovenox Code Status: Full code, confirmed with patient on admission Family Communication: Discussed with patient, she has discussed with family Disposition Plan: From home and likely discharge to home pending clinical progress Consults called: None Severity of Illness: The appropriate patient status for this patient is INPATIENT. Inpatient status is judged to be reasonable and necessary in order to provide the required intensity of service to ensure the patient's safety. The patient's presenting symptoms, physical exam findings, and initial radiographic and laboratory data in the context of their chronic comorbidities is felt to place them at high risk for further clinical deterioration. Furthermore, it is not anticipated that the patient will be medically stable for discharge from the hospital within 2 midnights of admission.   * I certify that at the point of admission it is my clinical judgment that the patient will require inpatient hospital care spanning beyond 2 midnights from the point of admission due to high intensity of service, high risk for  further deterioration and high frequency of surveillance required.Zada Finders MD [facility]  If 7PM-7AM, please contact night-coverage [email or web address]  01/02/2022, 7:37 PM

## 2022-01-02 NOTE — Hospital Course (Signed)
Renee Paul is a 36 y.o. female with medical history significant for chronic diastolic CHF, chronic respiratory failure with hypoxia on 2 L O2 at nighttime, morbid obesity, presumed OSA/OHS nonadherent to CPAP who is admitted with acute on chronic respiratory failure with hypoxia in setting of acute on chronic diastolic CHF.

## 2022-01-03 ENCOUNTER — Other Ambulatory Visit: Payer: Self-pay

## 2022-01-03 DIAGNOSIS — I5033 Acute on chronic diastolic (congestive) heart failure: Secondary | ICD-10-CM

## 2022-01-03 DIAGNOSIS — J9621 Acute and chronic respiratory failure with hypoxia: Secondary | ICD-10-CM | POA: Diagnosis not present

## 2022-01-03 LAB — BASIC METABOLIC PANEL
Anion gap: 6 (ref 5–15)
BUN: 12 mg/dL (ref 6–20)
CO2: 35 mmol/L — ABNORMAL HIGH (ref 22–32)
Calcium: 8.4 mg/dL — ABNORMAL LOW (ref 8.9–10.3)
Chloride: 97 mmol/L — ABNORMAL LOW (ref 98–111)
Creatinine, Ser: 0.77 mg/dL (ref 0.44–1.00)
GFR, Estimated: >60 mL/min (ref >60)
Glucose, Bld: 124 mg/dL — ABNORMAL HIGH (ref 70–99)
Potassium: 4.1 mmol/L (ref 3.5–5.1)
Sodium: 138 mmol/L (ref 135–145)

## 2022-01-03 LAB — I-STAT VENOUS BLOOD GAS, ED
Acid-Base Excess: 9 mmol/L — ABNORMAL HIGH (ref 0.0–2.0)
Bicarbonate: 33.2 mmol/L — ABNORMAL HIGH (ref 20.0–28.0)
Calcium, Ion: 0.82 mmol/L — CL (ref 1.15–1.40)
HCT: 40 % (ref 36.0–46.0)
Hemoglobin: 13.6 g/dL (ref 12.0–15.0)
O2 Saturation: 88 %
Patient temperature: 98.5
Potassium: 4.2 mmol/L (ref 3.5–5.1)
Sodium: 135 mmol/L (ref 135–145)
TCO2: 34 mmol/L — ABNORMAL HIGH (ref 22–32)
pCO2, Ven: 41.5 mmHg — ABNORMAL LOW (ref 44–60)
pH, Ven: 7.511 — ABNORMAL HIGH (ref 7.25–7.43)
pO2, Ven: 50 mmHg — ABNORMAL HIGH (ref 32–45)

## 2022-01-03 LAB — CBC
HCT: 47.8 % — ABNORMAL HIGH (ref 36.0–46.0)
Hemoglobin: 13.3 g/dL (ref 12.0–15.0)
MCH: 27.6 pg (ref 26.0–34.0)
MCHC: 27.8 g/dL — ABNORMAL LOW (ref 30.0–36.0)
MCV: 99.2 fL (ref 80.0–100.0)
Platelets: 194 10*3/uL (ref 150–400)
RBC: 4.82 MIL/uL (ref 3.87–5.11)
RDW: 17.6 % — ABNORMAL HIGH (ref 11.5–15.5)
WBC: 5.6 10*3/uL (ref 4.0–10.5)
nRBC: 0 % (ref 0.0–0.2)

## 2022-01-03 LAB — MAGNESIUM: Magnesium: 2.1 mg/dL (ref 1.7–2.4)

## 2022-01-03 LAB — GLUCOSE, CAPILLARY
Glucose-Capillary: 103 mg/dL — ABNORMAL HIGH (ref 70–99)
Glucose-Capillary: 177 mg/dL — ABNORMAL HIGH (ref 70–99)
Glucose-Capillary: 112 mg/dL — ABNORMAL HIGH (ref 70–99)
Glucose-Capillary: 110 mg/dL — ABNORMAL HIGH (ref 70–99)

## 2022-01-03 MED ORDER — MELATONIN 3 MG PO TABS
3.0000 mg | ORAL_TABLET | Freq: Every day | ORAL | Status: DC
  Administered 2022-01-03: 3 mg via ORAL
  Filled 2022-01-03: qty 1

## 2022-01-03 NOTE — TOC Initial Note (Addendum)
Transition of Care [facility]) - Initial/Assessment Note    Patient Details  Name: Renee Paul MRN: [id number] Date of Birth: July 17, 1985  [facility] CM/SW Contact:    Dessa Phi, RN Phone Number: 01/03/2022, 1:47 PM  Clinical Narrative:Patient active w/[employer]-home 02;has CPAP;also has pulmonology f/u appt set. Will need PTAR for transport home @ d/c-472lbs.                   Expected Discharge Plan: Home/Self Care Barriers to Discharge: Continued Medical Work up   Patient Goals and CMS Choice Patient states their goals for this hospitalization and ongoing recovery are::  (Home) CMS Medicare.gov Compare Post Acute Care list provided to:: Patient Choice offered to / list presented to : Patient  Expected Discharge Plan and Services Expected Discharge Plan: Home/Self Care   Discharge Planning Services: CM Consult Post Acute Care Choice:  (na) Living arrangements for the past 2 months: Single Family Home                                      Prior Living Arrangements/Services Living arrangements for the past 2 months: Single Family Home Lives with:: Spouse Patient language and need for interpreter reviewed:: Yes Do you feel safe going back to the place where you live?: Yes      Need for Family Participation in Patient Care: Yes (Comment) Care giver support system in place?: Yes (comment) Current home services: DME ([employer]-home 02-has travel tank;has CPAP) Criminal Activity/Legal Involvement Pertinent to Current Situation/Hospitalization: No - Comment as needed  Activities of Daily Living Home Assistive Devices/Equipment: Oxygen ADL Screening (condition at time of admission) Patient's cognitive ability adequate to safely complete daily activities?: Yes Is the patient deaf or have difficulty hearing?: No Does the patient have difficulty seeing, even when wearing glasses/contacts?: No Does the patient have difficulty concentrating, remembering, or  making decisions?: No Patient able to express need for assistance with ADLs?: Yes Does the patient have difficulty dressing or bathing?: Yes Independently performs ADLs?: No Communication: Independent Dressing (OT): Needs assistance Is this a change from baseline?: Pre-admission baseline Grooming: Independent Feeding: Independent Bathing: Needs assistance Is this a change from baseline?: Pre-admission baseline Toileting: Needs assistance Is this a change from baseline?: Pre-admission baseline In/Out Bed: Needs assistance Is this a change from baseline?: Pre-admission baseline Walks in Home: Needs assistance Is this a change from baseline?: Pre-admission baseline Does the patient have difficulty walking or climbing stairs?: Yes Weakness of Legs: Both Weakness of Arms/Hands: Both  Permission Sought/Granted Permission sought to share information with : Case Manager Permission granted to share information with : Yes, Verbal Permission Granted  Share Information with NAME:  (Case Manager)           Emotional Assessment Appearance:: Appears stated age Attitude/Demeanor/Rapport: Gracious Affect (typically observed): Accepting Orientation: : Oriented to Self, Oriented to Place, Oriented to  Time, Oriented to Situation Alcohol / Substance Use: Not Applicable Psych Involvement: No (comment)  Admission diagnosis:  SOB (shortness of breath) [R06.02] Acute on chronic respiratory failure with hypoxia and hypercapnia (HCC) [G26.94, J96.22] Patient Active Problem List   Diagnosis Date Noted   Type 2 diabetes mellitus ([place]) 01/02/2022   Acute on chronic respiratory failure ([place]) 12/07/2021   Acute on chronic respiratory failure with hypoxia ([place]) 12/06/2021   AKI (acute kidney injury) ([place]) 12/06/2021   Chronic diastolic CHF (congestive heart failure) ([place]) 11/02/2021  Class 3 obesity (HCC)    Chronic iron deficiency anemia    Elevated TSH 06/08/2021   Morbid obesity with BMI of 70  and over, adult ([place]) - BMI 81.53    Asthma    Acute on chronic diastolic CHF (congestive heart failure) ([address]) 03/21/2021   Symptomatic anemia 03/21/2021   Chronic respiratory failure with hypoxia and hypercapnia ([place]) 03/21/2021   OSA (obstructive sleep apnea) 03/21/2021   PCP:  Medicine, [place]:   [address], [place] [address] Phone: [phone] Fax: [phone]  [id number] [place] [facility] [address]Phone: [phone]: [phone]    Social Determinants of Health (SDOH) Interventions    Readmission Risk Interventions     No data to display

## 2022-01-03 NOTE — Progress Notes (Signed)
Progress Note    Renee Paul   GHW:[id number]  DOB: May 16, 1985  DOA: 01/02/2022     1 PCP: [facility]  Initial CC: SOB  Hospital Course: Renee Paul is a 36 y.o. female with medical history significant for chronic diastolic CHF, chronic respiratory failure with hypoxia on 2 L O2 at nighttime, morbid obesity, presumed OSA/OHS nonadherent to CPAP who is admitted with acute on chronic respiratory failure with hypoxia in setting of acute on chronic diastolic CHF.  Interval History:  Breathing better today when seen compared to yesterday. She is asking about getting a portable O2 concentrator.   Assessment and Plan: * Acute on chronic respiratory failure with hypoxia (HCC) Multifactorial related to acute on chronic CHF and morbid obesity associated with OSA/OHS complicated by treatment nonadherence.  SPO2 53% on RA on ED arrival, stable on 3 L O2 via  while at rest. -Continue diuresis as above -Continue CPAP nightly -Would likely benefit from portable O2, desat evaluation prior to discharge; patient requesting portable concentrator if able  -Follow-up with pulmonology outpatient  Acute on chronic diastolic CHF (congestive heart failure) ([place]) Recent echo 12/07/2021 showed EF 60-65%.  BNP slightly elevated at 111.8 although patient morbidly obese.  Has not been able to pick up Lasix for home use. -Continue IV Lasix 40 mg twice daily -Monitor strict I/O's and daily weights -Continue supplemental O2 and wean as able  OSA (obstructive sleep apnea) Presumed OSA/OHS nonadherent to CPAP due to living situation. -Continue CPAP nightly  Type 2 diabetes mellitus ([place]) Appears to be new diagnosis with hemoglobin A1c 7.5% on 12/17/2021.  Her PCP prescribed Ozempic but she is hesitant to start due to concern for potential side effects. We discussed further after admission; patient more willing to trial med at discharge -Place on SSI -Patient advised on  lifestyle/dietary adjustments  Class 3 obesity (HCC) Calculated BMI 81.55.  Patient advised dietary changes.  Recently prescribed Ozempic by her PCP but has not started.  Asthma Stable, no wheezing on admit.  Continue Pulmicort and albuterol as needed.   Old records reviewed in assessment of this patient  Antimicrobials:   DVT prophylaxis:   Lovenox   Code Status:   Code Status: Full Code  Mobility Assessment (last 72 hours)     Mobility Assessment     Row Name 01/03/22 0842 01/02/22 2100         Does patient have an order for bedrest or is patient medically unstable No - Continue assessment No - Continue assessment      What is the highest level of mobility based on the progressive mobility assessment? Level 3 (Stands with assist) - Balance while standing  and cannot march in place Level 3 (Stands with assist) - Balance while standing  and cannot march in place      Is the above level different from baseline mobility prior to current illness? -- Yes - Recommend PT order               Barriers to discharge: none Disposition Plan:  Home Status is: Inpt  Objective: Blood pressure 129/81, pulse 91, temperature 98.4 F (36.9 C), temperature source Oral, resp. rate 18, height '5\' 4"'$  (1.626 m), weight (!) 214.6 kg, SpO2 93 %.  Examination:  Physical Exam Constitutional:      General: She is not in acute distress.    Appearance: She is well-developed. She is obese.  HENT:     Head: Normocephalic and atraumatic.  Mouth/Throat:     Mouth: Mucous membranes are moist.  Eyes:     Extraocular Movements: Extraocular movements intact.  Pulmonary:     Effort: Pulmonary effort is normal.     Breath sounds: Rales present.  Abdominal:     General: Bowel sounds are normal.     Palpations: Abdomen is soft.     Tenderness: There is no abdominal tenderness.  Musculoskeletal:        General: Swelling present. Normal range of motion.     Cervical back: Normal range of motion  and neck supple.  Skin:    General: Skin is warm and dry.  Neurological:     General: No focal deficit present.     Mental Status: She is alert.  Psychiatric:        Mood and Affect: Mood normal.      Consultants:    Procedures:    Data Reviewed: Results for orders placed or performed during the hospital encounter of 01/02/22 (from the past 24 hour(s))  Magnesium     Status: None   Collection Time: 01/03/22  4:47 AM  Result Value Ref Range   Magnesium 2.1 1.7 - 2.4 mg/dL  Basic metabolic panel     Status: Abnormal   Collection Time: 01/03/22  4:47 AM  Result Value Ref Range   Sodium 138 135 - 145 mmol/L   Potassium 4.1 3.5 - 5.1 mmol/L   Chloride 97 (L) 98 - 111 mmol/L   CO2 35 (H) 22 - 32 mmol/L   Glucose, Bld 124 (H) 70 - 99 mg/dL   BUN 12 6 - 20 mg/dL   Creatinine, Ser 0.77 0.44 - 1.00 mg/dL   Calcium 8.4 (L) 8.9 - 10.3 mg/dL   GFR, Estimated >60 >60 mL/min   Anion gap 6 5 - 15  CBC     Status: Abnormal   Collection Time: 01/03/22  4:47 AM  Result Value Ref Range   WBC 5.6 4.0 - 10.5 K/uL   RBC 4.82 3.87 - 5.11 MIL/uL   Hemoglobin 13.3 12.0 - 15.0 g/dL   HCT 47.8 (H) 36.0 - 46.0 %   MCV 99.2 80.0 - 100.0 fL   MCH 27.6 26.0 - 34.0 pg   MCHC 27.8 (L) 30.0 - 36.0 g/dL   RDW 17.6 (H) 11.5 - 15.5 %   Platelets 194 150 - 400 K/uL   nRBC 0.0 0.0 - 0.2 %  Glucose, capillary     Status: Abnormal   Collection Time: 01/03/22  8:41 AM  Result Value Ref Range   Glucose-Capillary 103 (H) 70 - 99 mg/dL  Glucose, capillary     Status: Abnormal   Collection Time: 01/03/22 11:11 AM  Result Value Ref Range   Glucose-Capillary 177 (H) 70 - 99 mg/dL    I have Reviewed nursing notes, Vitals, and Lab results since pt's last encounter. Pertinent lab results : see above I have ordered test including BMP, CBC, Mg I have reviewed the last note from staff over past 24 hours I have discussed pt's care plan and test results with nursing staff, case manager   LOS: 1 day   Dwyane Dee, MD [facility] 01/03/2022, 2:10 PM

## 2022-01-04 ENCOUNTER — Encounter: Payer: Self-pay | Admitting: Internal Medicine

## 2022-01-04 DIAGNOSIS — J9621 Acute and chronic respiratory failure with hypoxia: Secondary | ICD-10-CM | POA: Diagnosis not present

## 2022-01-04 DIAGNOSIS — I5033 Acute on chronic diastolic (congestive) heart failure: Secondary | ICD-10-CM | POA: Diagnosis not present

## 2022-01-04 LAB — GLUCOSE, CAPILLARY: Glucose-Capillary: 105 mg/dL — ABNORMAL HIGH (ref 70–99)

## 2022-01-04 LAB — BASIC METABOLIC PANEL
Anion gap: 8 (ref 5–15)
BUN: 13 mg/dL (ref 6–20)
CO2: 35 mmol/L — ABNORMAL HIGH (ref 22–32)
Calcium: 8.5 mg/dL — ABNORMAL LOW (ref 8.9–10.3)
Chloride: 96 mmol/L — ABNORMAL LOW (ref 98–111)
Creatinine, Ser: 0.75 mg/dL (ref 0.44–1.00)
GFR, Estimated: >60 mL/min (ref >60)
Glucose, Bld: 132 mg/dL — ABNORMAL HIGH (ref 70–99)
Potassium: 4 mmol/L (ref 3.5–5.1)
Sodium: 139 mmol/L (ref 135–145)

## 2022-01-04 LAB — MAGNESIUM: Magnesium: 2.1 mg/dL (ref 1.7–2.4)

## 2022-01-04 MED ORDER — FUROSEMIDE 40 MG PO TABS: 40.0000 mg | ORAL_TABLET | Freq: Every day | ORAL | 0 refills | Status: DC

## 2022-01-04 MED ORDER — GUAIFENESIN-DM 100-10 MG/5ML PO SYRP
10.0000 mL | ORAL_SOLUTION | Freq: Four times a day (QID) | ORAL | Status: DC | PRN
  Administered 2022-01-04: 10 mL via ORAL
  Filled 2022-01-04: qty 10

## 2022-01-04 MED ORDER — FERROUS SULFATE 325 (65 FE) MG PO TABS: 325.0000 mg | ORAL_TABLET | Freq: Every day | ORAL | 3 refills | Status: AC

## 2022-01-04 NOTE — TOC Transition Note (Signed)
Transition of Care [facility]) - CM/SW Discharge Note   Patient Details  Name: Renee Paul MRN: [id number] Date of Birth: 14-Jul-1985  [facility]) CM/SW Contact:  Dessa Phi, RN Phone Number: 01/04/2022, 12:40 PM   Clinical Narrative: patient states she has concerns about her home 02-On speaker phone [employer] rep Jermaine aware to call patient on her c# to discuss-portables-[employer] will deliver travel tank to rm prior d/c. Patient states she has her own transport home, she declines PTAR. Nsg aware of note needed for patient about her hospital stay.No further CM needs.      Final next level of care: Home/Self Care Barriers to Discharge: No Barriers Identified   Patient Goals and CMS Choice Patient states their goals for this hospitalization and ongoing recovery are::  (Home) CMS Medicare.gov Compare Post Acute Care list provided to:: Patient Choice offered to / list presented to : Patient  Discharge Placement                       Discharge Plan and Services   Discharge Planning Services: CM Consult Post Acute Care Choice:  (na)                               Social Determinants of Health (SDOH) Interventions     Readmission Risk Interventions    01/03/2022    1:48 PM  Readmission Risk Prevention Plan  Transportation Screening Complete  PCP or Specialist Appt within 3-5 Days Complete  HRI or [place] Complete  Social Work Consult for [place] Planning/Counseling Complete  Palliative Care Screening Complete  Medication Review [employer]) Complete

## 2022-01-04 NOTE — Discharge Summary (Signed)
Physician Discharge Summary   Renee Paul NLG:[id number] DOB: February 18, 1986 DOA: 01/02/2022  PCP: Medicine, [place] date: 01/02/2022 Discharge date: 01/04/2022  Barriers to discharge: none  Admitted From: Home Disposition:  Home Discharging physician: Dwyane Dee, MD  Recommendations for Outpatient Follow-up:  Encourage trial of ozempic and further weight loss strategies   Home Health:  Equipment/Devices:   Discharge Condition: stable CODE STATUS: Full Diet recommendation:  Diet Orders (From admission, onward)     Start     Ordered   01/04/22 0000  Diet - low sodium heart healthy        01/04/22 1023   01/02/22 1834  Diet Heart Room service appropriate? Yes; Fluid consistency: Thin  Diet effective now       Question Answer Comment  Room service appropriate? Yes   Fluid consistency: Thin      01/02/22 1833            Hospital Course: Renee Paul is a 36 y.o. female with medical history significant for chronic diastolic CHF, chronic respiratory failure with hypoxia on 2 L O2 at nighttime, morbid obesity, presumed OSA/OHS nonadherent to CPAP who is admitted with acute on chronic respiratory failure with hypoxia in setting of acute on chronic diastolic CHF.  Assessment and Plan: * Acute on chronic respiratory failure with hypoxia (HCC) Multifactorial related to acute on chronic CHF and morbid obesity associated with OSA/OHS complicated by treatment nonadherence.  SPO2 53% on RA on ED arrival, stable on 3 L O2 via [place] while at rest. -Continue diuresis as above -Portable oxygen ordered at discharge  Acute on chronic diastolic CHF (congestive heart failure) ([place]) Recent echo 12/07/2021 showed EF 60-65%.  BNP slightly elevated at 111.8 although patient morbidly obese -Responded well to IV Lasix in the hospital - Patient continued on oral Lasix at discharge with parameters to take second dose if/when develops worsening edema or weight  gain.  Patient voiced understanding of instructions  OSA (obstructive sleep apnea) Presumed OSA/OHS nonadherent to CPAP due to living situation.  Type 2 diabetes mellitus ([place]) Appears to be new diagnosis with hemoglobin A1c 7.5% on 12/17/2021.  Her PCP prescribed Ozempic but she is hesitant to start due to concern for potential side effects. We discussed further after admission; patient more willing to trial med at discharge -Patient advised on lifestyle/dietary adjustments  Class 3 obesity (HCC) Calculated BMI 81.55.  Patient advised dietary changes.  Recently prescribed Ozempic by her PCP but has not started.  Asthma Stable, no wheezing on admit.       The patient's chronic medical conditions were treated accordingly per the patient's home medication regimen except as noted.  On day of discharge, patient was felt deemed stable for discharge. Patient/family member advised to call PCP or come back to ER if needed.   Principal Diagnosis: Acute on chronic respiratory failure with hypoxia [facility])  Discharge Diagnoses: Active Hospital Problems   Diagnosis Date Noted   Acute on chronic respiratory failure with hypoxia ([place]) 12/06/2021    Priority: 2.   Acute on chronic diastolic CHF (congestive heart failure) ([place]) 03/21/2021    Priority: 1.   OSA (obstructive sleep apnea) 03/21/2021    Priority: 3.   Type 2 diabetes mellitus ([place]) 01/02/2022    Priority: 4.   Class 3 obesity (HCC)     Priority: 5.   Asthma     Priority: 5.    Resolved Hospital Problems  No resolved problems to display.  Discharge Instructions     Diet - low sodium heart healthy   Complete by: As directed    Increase activity slowly   Complete by: As directed       Allergies as of 01/04/2022   No Known Allergies      Medication List     STOP taking these medications    megestrol 40 MG tablet Commonly known as: MEGACE       TAKE these medications    albuterol 108 (90 Base) MCG/ACT  inhaler Commonly known as: VENTOLIN HFA Inhale 2 puffs into the lungs every 4 (four) hours as needed for wheezing or shortness of breath.   budesonide 0.25 MG/2ML nebulizer solution Commonly known as: PULMICORT Take 2 mLs (0.25 mg total) by nebulization 2 (two) times daily.   ferrous sulfate 325 (65 FE) MG tablet Take 1 tablet (325 mg total) by mouth daily with breakfast. What changed:  when to take this reasons to take this   furosemide 40 MG tablet Commonly known as: LASIX Take 1 tablet (40 mg total) by mouth daily. Take extra dose in afternoon if worsening swelling in legs/arms or 3-5 lb weight gain in 24 hours. What changed: additional instructions   metFORMIN 500 MG tablet Commonly known as: Glucophage Take 1 tablet (500 mg total) by mouth daily with breakfast.               Durable Medical Equipment  (From admission, onward)           Start     Ordered   01/04/22 1021  For home use only DME oxygen  Once       Question Answer Comment  Length of Need Lifetime   Mode or (Route) Nasal cannula   Liters per Minute 4   Frequency Continuous (stationary and portable oxygen unit needed)   Oxygen conserving device Yes   Oxygen delivery system Gas      01/04/22 1021            Follow-up Information     [facility] Follow up.   Why: Home oxygen Contact information: [address] [place]  [id number] [phone]               No Known Allergies  Consultations:   Procedures:   Discharge Exam: BP (!) 133/93 (BP Location: Left Arm)   Pulse 86   Temp 98.1 F (36.7 C) (Oral)   Resp (!) 22   Ht '5\' 4"'$  (1.626 m)   Wt (!) 214.6 kg   SpO2 96%   BMI 81.19 kg/m  Physical Exam Constitutional:      General: She is not in acute distress.    Appearance: She is well-developed. She is obese.  HENT:     Head: Normocephalic and atraumatic.     Mouth/Throat:     Mouth: Mucous membranes are moist.  Eyes:     Extraocular Movements: Extraocular movements  intact.  Pulmonary:     Effort: Pulmonary effort is normal.     Breath sounds: Rales present.  Abdominal:     General: Bowel sounds are normal.     Palpations: Abdomen is soft.     Tenderness: There is no abdominal tenderness.  Musculoskeletal:        General: Swelling present. Normal range of motion.     Cervical back: Normal range of motion and neck supple.  Skin:    General: Skin is warm and dry.  Neurological:     General:  No focal deficit present.     Mental Status: She is alert.  Psychiatric:        Mood and Affect: Mood normal.      The results of significant diagnostics from this hospitalization (including imaging, microbiology, ancillary and laboratory) are listed below for reference.   Microbiology: Recent Results (from the past 240 hour(s))  SARS Coronavirus 2 by RT PCR (hospital order, performed in [facility] hospital lab) *cepheid single result test* Anterior Nasal Swab     Status: None   Collection Time: 12/31/21  4:19 PM   Specimen: Anterior Nasal Swab  Result Value Ref Range Status   SARS Coronavirus 2 by RT PCR NEGATIVE NEGATIVE Final    Comment: (NOTE) SARS-CoV-2 target nucleic acids are NOT DETECTED.  The SARS-CoV-2 RNA is generally detectable in upper and lower respiratory specimens during the acute phase of infection. The lowest concentration of SARS-CoV-2 viral copies this assay can detect is 250 copies / mL. A negative result does not preclude SARS-CoV-2 infection and should not be used as the sole basis for treatment or other patient management decisions.  A negative result may occur with improper specimen collection / handling, submission of specimen other than nasopharyngeal swab, presence of viral mutation(s) within the areas targeted by this assay, and inadequate number of viral copies (<250 copies / mL). A negative result must be combined with clinical observations, patient history, and epidemiological information.  Fact Sheet for Patients:    https://www.patel.info/  Fact Sheet for Healthcare Providers: https://hall.com/  This test is not yet approved or  cleared by the [place] FDA and has been authorized for detection and/or diagnosis of SARS-CoV-2 by FDA under an Emergency Use Authorization (EUA).  This EUA will remain in effect (meaning this test can be used) for the duration of the COVID-19 declaration under Section 564(b)(1) of the Act, 21 U.S.C. section 360bbb-3(b)(1), unless the authorization is terminated or revoked sooner.  Performed at [facility], [place] [address] [place], [place] [address]   SARS Coronavirus 2 by RT PCR (hospital order, performed in [facility] hospital lab) *cepheid single result test* Anterior Nasal Swab     Status: None   Collection Time: 01/02/22 12:46 PM   Specimen: Anterior Nasal Swab  Result Value Ref Range Status   SARS Coronavirus 2 by RT PCR NEGATIVE NEGATIVE Final    Comment: (NOTE) SARS-CoV-2 target nucleic acids are NOT DETECTED.  The SARS-CoV-2 RNA is generally detectable in upper and lower respiratory specimens during the acute phase of infection. The lowest concentration of SARS-CoV-2 viral copies this assay can detect is 250 copies / mL. A negative result does not preclude SARS-CoV-2 infection and should not be used as the sole basis for treatment or other patient management decisions.  A negative result may occur with improper specimen collection / handling, submission of specimen other than nasopharyngeal swab, presence of viral mutation(s) within the areas targeted by this assay, and inadequate number of viral copies (<250 copies / mL). A negative result must be combined with clinical observations, patient history, and epidemiological information.  Fact Sheet for Patients:   https://www.patel.info/  Fact Sheet for Healthcare Providers: https://hall.com/  This test is  not yet approved or  cleared by the [place] FDA and has been authorized for detection and/or diagnosis of SARS-CoV-2 by FDA under an Emergency Use Authorization (EUA).  This EUA will remain in effect (meaning this test can be used) for the duration of the COVID-19 declaration under  Section 564(b)(1) of the Act, 21 U.S.C. section 360bbb-3(b)(1), unless the authorization is terminated or revoked sooner.  Performed at [facility], [address] [place], [place] [address]      Labs: BNP (last 3 results) Recent Labs    12/06/21 1410 12/31/21 1230 01/02/22 1014  BNP 80.2 90.6 295.6*   Basic Metabolic Panel: Recent Labs  Lab 12/31/21 1401 12/31/21 1633 01/02/22 1014 01/02/22 1128 01/03/22 0447 01/04/22 0455  NA 140 143 139 135 138 139  K 4.5 3.4* 4.5 4.2 4.1 4.0  CL 99  --  98  --  97* 96*  CO2 35*  --  32  --  35* 35*  GLUCOSE 106*  --  178*  --  124* 132*  BUN 10  --  12  --  12 13  CREATININE 0.85  --  0.85  --  0.77 0.75  CALCIUM 8.2*  --  8.5*  --  8.4* 8.5*  MG  --   --   --   --  2.1 2.1   Liver Function Tests: Recent Labs  Lab 01/02/22 1014  AST 20  ALT 20  ALKPHOS 55  BILITOT 1.0  PROT 7.8  ALBUMIN 3.2*   No results for input(s): "LIPASE", "AMYLASE" in the last 168 hours. No results for input(s): "AMMONIA" in the last 168 hours. CBC: Recent Labs  Lab 12/31/21 1401 12/31/21 1633 01/02/22 1014 01/02/22 1128 01/03/22 0447  WBC 5.9  --  6.1  --  5.6  NEUTROABS 4.2  --  4.6  --   --   HGB 12.6 12.2 13.0 13.6 13.3  HCT 43.6 36.0 45.9 40.0 47.8*  MCV 97.3  --  96.6  --  99.2  PLT 115*  --  149*  --  194   Cardiac Enzymes: No results for input(s): "CKTOTAL", "CKMB", "CKMBINDEX", "TROPONINI" in the last 168 hours. BNP: Invalid input(s): "POCBNP" CBG: Recent Labs  Lab 01/03/22 0841 01/03/22 1111 01/03/22 1647 01/03/22 2043 01/04/22 0752  GLUCAP 103* 177* 112* 110* 105*   D-Dimer No results for input(s): "DDIMER" in the  last 72 hours. Hgb A1c No results for input(s): "HGBA1C" in the last 72 hours. Lipid Profile No results for input(s): "CHOL", "HDL", "LDLCALC", "TRIG", "CHOLHDL", "LDLDIRECT" in the last 72 hours. Thyroid function studies No results for input(s): "TSH", "T4TOTAL", "T3FREE", "THYROIDAB" in the last 72 hours.  Invalid input(s): "FREET3" Anemia work up No results for input(s): "VITAMINB12", "FOLATE", "FERRITIN", "TIBC", "IRON", "RETICCTPCT" in the last 72 hours. Urinalysis    Component Value Date/Time   COLORURINE YELLOW 06/05/2021 0416   APPEARANCEUR CLEAR 06/05/2021 0416   LABSPEC 1.014 06/05/2021 0416   PHURINE 7.0 06/05/2021 0416   GLUCOSEU NEGATIVE 06/05/2021 0416   HGBUR LARGE (A) 06/05/2021 0416   BILIRUBINUR NEGATIVE 06/05/2021 0416   KETONESUR NEGATIVE 06/05/2021 0416   PROTEINUR NEGATIVE 06/05/2021 0416   NITRITE NEGATIVE 06/05/2021 0416   LEUKOCYTESUR NEGATIVE 06/05/2021 0416   Sepsis Labs Recent Labs  Lab 12/31/21 1401 01/02/22 1014 01/03/22 0447  WBC 5.9 6.1 5.6   Microbiology Recent Results (from the past 240 hour(s))  SARS Coronavirus 2 by RT PCR (hospital order, performed in [facility] hospital lab) *cepheid single result test* Anterior Nasal Swab     Status: None   Collection Time: 12/31/21  4:19 PM   Specimen: Anterior Nasal Swab  Result Value Ref Range Status   SARS Coronavirus 2 by RT PCR NEGATIVE NEGATIVE Final    Comment: (  NOTE) SARS-CoV-2 target nucleic acids are NOT DETECTED.  The SARS-CoV-2 RNA is generally detectable in upper and lower respiratory specimens during the acute phase of infection. The lowest concentration of SARS-CoV-2 viral copies this assay can detect is 250 copies / mL. A negative result does not preclude SARS-CoV-2 infection and should not be used as the sole basis for treatment or other patient management decisions.  A negative result may occur with improper specimen collection / handling, submission of specimen other than  nasopharyngeal swab, presence of viral mutation(s) within the areas targeted by this assay, and inadequate number of viral copies (<250 copies / mL). A negative result must be combined with clinical observations, patient history, and epidemiological information.  Fact Sheet for Patients:   https://www.patel.info/  Fact Sheet for Healthcare Providers: https://hall.com/  This test is not yet approved or  cleared by the [place] FDA and has been authorized for detection and/or diagnosis of SARS-CoV-2 by FDA under an Emergency Use Authorization (EUA).  This EUA will remain in effect (meaning this test can be used) for the duration of the COVID-19 declaration under Section 564(b)(1) of the Act, 21 U.S.C. section 360bbb-3(b)(1), unless the authorization is terminated or revoked sooner.  Performed at [facility], [place] [address], [place], [place] [address]   SARS Coronavirus 2 by RT PCR (hospital order, performed in [facility] hospital lab) *cepheid single result test* Anterior Nasal Swab     Status: None   Collection Time: 01/02/22 12:46 PM   Specimen: Anterior Nasal Swab  Result Value Ref Range Status   SARS Coronavirus 2 by RT PCR NEGATIVE NEGATIVE Final    Comment: (NOTE) SARS-CoV-2 target nucleic acids are NOT DETECTED.  The SARS-CoV-2 RNA is generally detectable in upper and lower respiratory specimens during the acute phase of infection. The lowest concentration of SARS-CoV-2 viral copies this assay can detect is 250 copies / mL. A negative result does not preclude SARS-CoV-2 infection and should not be used as the sole basis for treatment or other patient management decisions.  A negative result may occur with improper specimen collection / handling, submission of specimen other than nasopharyngeal swab, presence of viral mutation(s) within the areas targeted by this assay, and inadequate number of viral copies (<250 copies  / mL). A negative result must be combined with clinical observations, patient history, and epidemiological information.  Fact Sheet for Patients:   https://www.patel.info/  Fact Sheet for Healthcare Providers: https://hall.com/  This test is not yet approved or  cleared by the [place] FDA and has been authorized for detection and/or diagnosis of SARS-CoV-2 by FDA under an Emergency Use Authorization (EUA).  This EUA will remain in effect (meaning this test can be used) for the duration of the COVID-19 declaration under Section 564(b)(1) of the Act, 21 U.S.C. section 360bbb-3(b)(1), unless the authorization is terminated or revoked sooner.  Performed at [facility], [address] [place], [place] [address]     Procedures/Studies: DG Chest Portable 1 View  Result Date: 01/02/2022 CLINICAL DATA:  Sob, cp. EXAM: PORTABLE CHEST - 1 VIEW COMPARISON:  12/31/2021 FINDINGS: Obesity, degrading the image diffusely. Prominent perihilar bronchovascular structures, stable since previous. Heart size upper limits normal for technique. No effusion.  No pneumothorax. Visualized bones unremarkable. IMPRESSION: No acute findings. Obesity. Electronically Signed   By: Lucrezia Europe M.D.   On: 01/02/2022 10:37   DG Chest Port 1 View  Result Date: 12/31/2021 CLINICAL DATA:  Provided history: Shortness  of breath. History of asthma, CHF, morbid obesity. EXAM: PORTABLE CHEST 1 VIEW COMPARISON:  Chest radiograph 12/06/2021 and earlier. FINDINGS: Cardiomegaly with central pulmonary vascular congestion. Interstitial and ill-defined airspace opacities within the mid and lower lungs, bilaterally. No sizable pleural effusion or evidence of pneumothorax. No acute bony abnormality identified. IMPRESSION: Cardiomegaly with central pulmonary vascular congestion. Interstitial and ill-defined airspace opacities within the mid and lower lungs, bilaterally. These  findings are favored to reflect pulmonary edema. However, correlate clinically to exclude signs/symptoms that would suggest atypical/viral pneumonia. Electronically Signed   By: Kellie Simmering D.O.   On: 12/31/2021 12:16   VAS [place] LOWER EXTREMITY VENOUS (DVT)  Result Date: 12/08/2021  Lower Venous DVT Study Patient Name:  BRAYLI KLINGBEIL  Date of Exam:   12/07/2021 Medical Rec #: [id number]        Accession #:    [id number] Date of Birth: 1985-11-30        Patient Gender: F Patient Age:   30 years Exam Location:  [facility] Procedure:      VAS [place] LOWER EXTREMITY VENOUS (DVT) Referring Phys: Bonnielee Haff --------------------------------------------------------------------------------  Indications: Swelling.  Limitations: Body habitus. BMI 81. Comparison Study: No prior studies. Performing Technologist: Darlin Coco RDMS, RVT  Examination Guidelines: A complete evaluation includes B-mode imaging, spectral Doppler, color Doppler, and power Doppler as needed of all accessible portions of each vessel. Bilateral testing is considered an integral part of a complete examination. Limited examinations for reoccurring indications may be performed as noted. The reflux portion of the exam is performed with the patient in reverse Trendelenburg.  +---------+---------------+---------+-----------+----------+--------------+ RIGHT    CompressibilityPhasicitySpontaneityPropertiesThrombus Aging +---------+---------------+---------+-----------+----------+--------------+ CFV      Full           Yes      Yes                                 +---------+---------------+---------+-----------+----------+--------------+ SFJ      Full                                                        +---------+---------------+---------+-----------+----------+--------------+ FV Prox  Full                                                        +---------+---------------+---------+-----------+----------+--------------+ FV  Mid   Full                                                        +---------+---------------+---------+-----------+----------+--------------+ FV Distal               Yes      Yes                                 +---------+---------------+---------+-----------+----------+--------------+ PFV      Full                                                        +---------+---------------+---------+-----------+----------+--------------+  POP      Full           Yes      Yes                                 +---------+---------------+---------+-----------+----------+--------------+ PTV      Full                                                        +---------+---------------+---------+-----------+----------+--------------+ PERO     Full                                                        +---------+---------------+---------+-----------+----------+--------------+   +---------+---------------+---------+-----------+----------+--------------+ LEFT     CompressibilityPhasicitySpontaneityPropertiesThrombus Aging +---------+---------------+---------+-----------+----------+--------------+ CFV      Full           Yes      Yes                                 +---------+---------------+---------+-----------+----------+--------------+ SFJ      Full                                                        +---------+---------------+---------+-----------+----------+--------------+ FV Prox  Full                                                        +---------+---------------+---------+-----------+----------+--------------+ FV Mid   Full                                                        +---------+---------------+---------+-----------+----------+--------------+ FV DistalFull           Yes      Yes                                 +---------+---------------+---------+-----------+----------+--------------+ PFV      Full                                                         +---------+---------------+---------+-----------+----------+--------------+ POP      Full           Yes      Yes                                 +---------+---------------+---------+-----------+----------+--------------+  PTV      Full                                                        +---------+---------------+---------+-----------+----------+--------------+ PERO     Full                                                        +---------+---------------+---------+-----------+----------+--------------+     Summary: RIGHT: - There is no evidence of deep vein thrombosis in the lower extremity.  - No cystic structure found in the popliteal fossa.  LEFT: - There is no evidence of deep vein thrombosis in the lower extremity.  - No cystic structure found in the popliteal fossa.  *See table(s) above for measurements and observations. Electronically signed by Servando Snare MD on 12/08/2021 at 10:41:05 AM.    Final    ECHOCARDIOGRAM COMPLETE  Result Date: 12/07/2021    ECHOCARDIOGRAM REPORT   Patient Name:   Renee Paul Date of Exam: 12/07/2021 Medical Rec #:  [id number]       Height:       64.0 in Accession #:    [id number]      Weight:       475.0 lb Date of Birth:  [birth year]       BSA:          2.826 m Patient Age:    57 years        BP:           126/84 mmHg Patient Gender: F               HR:           63 bpm. Exam Location:  Inpatient Procedure: 2D Echo and Intracardiac Opacification Agent Indications:    Syncope  History:        Patient has prior history of Echocardiogram examinations, most                 recent 06/05/2021. CHF.  Sonographer:    Harvie Junior Referring Phys: [phone] ERIC CHEN  Sonographer Comments: Technically difficult study due to poor echo windows and patient is obese. Image acquisition challenging due to patient body habitus. IMPRESSIONS  1. Left ventricular ejection fraction, by estimation, is 60 to 65%. The left ventricle has normal function. The  left ventricle has no regional wall motion abnormalities. Left ventricular diastolic parameters are indeterminate.  2. Right ventricular systolic function is normal. The right ventricular size is not well visualized. There is normal pulmonary artery systolic pressure. The estimated right ventricular systolic pressure is 50.9 mmHg.  3. The mitral valve was not well visualized. No evidence of mitral valve regurgitation. No evidence of mitral stenosis.  4. The aortic valve was not well visualized. Aortic valve regurgitation is not visualized. No aortic stenosis is present. Comparison(s): No significant change from prior study. Conclusion(s)/Recommendation(s): Technically difficult study. No signficant valve disease by Doppler evaluation, and grossly normal function with use of echo contrast. FINDINGS  Left Ventricle: Left ventricular ejection fraction, by estimation, is 60 to 65%. The left ventricle has normal function. The left ventricle has no regional wall motion abnormalities. Definity contrast  agent was given IV to delineate the left ventricular  endocardial borders. The left ventricular internal cavity size was normal in size. There is borderline left ventricular hypertrophy. Left ventricular diastolic parameters are indeterminate. Right Ventricle: The right ventricular size is not well visualized. Right vetricular wall thickness was not well visualized. Right ventricular systolic function is normal. There is normal pulmonary artery systolic pressure. The tricuspid regurgitant velocity is 2.10 m/s, and with an assumed right atrial pressure of 3 mmHg, the estimated right ventricular systolic pressure is 35.4 mmHg. Left Atrium: Left atrial size was not well visualized. Right Atrium: Right atrial size was not well visualized. Pericardium: There is no evidence of pericardial effusion. Mitral Valve: The mitral valve was not well visualized. No evidence of mitral valve regurgitation. No evidence of mitral valve  stenosis. Tricuspid Valve: The tricuspid valve is not well visualized. Tricuspid valve regurgitation is trivial. No evidence of tricuspid stenosis. Aortic Valve: The aortic valve was not well visualized. Aortic valve regurgitation is not visualized. No aortic stenosis is present. Pulmonic Valve: The pulmonic valve was not well visualized. Pulmonic valve regurgitation is trivial. No evidence of pulmonic stenosis. Aorta: The aortic root was not well visualized and the ascending aorta was not well visualized. IAS/Shunts: The interatrial septum was not well visualized.  LEFT VENTRICLE PLAX 2D LVIDd:         4.50 cm     Diastology LVIDs:         3.20 cm     LV e' medial:    6.64 cm/s LV PW:         1.20 cm     LV E/e' medial:  15.5 LV IVS:        1.10 cm     LV e' lateral:   8.59 cm/s LVOT diam:     2.50 cm     LV E/e' lateral: 12.0 LV SV:         89 LV SV Index:   31 LVOT Area:     4.91 cm  LV Volumes (MOD) LV vol d, MOD A2C: 62.9 ml LV vol d, MOD A4C: 63.7 ml LV vol s, MOD A2C: 26.8 ml LV vol s, MOD A4C: 27.5 ml LV SV MOD A2C:     36.1 ml LV SV MOD A4C:     63.7 ml LV SV MOD BP:      36.4 ml RIGHT VENTRICLE RV S prime:     15.10 cm/s TAPSE (M-mode): 2.4 cm LEFT ATRIUM             Index LA diam:        3.60 cm 1.27 cm/m LA Vol (A2C):   24.1 ml 8.53 ml/m LA Vol (A4C):   45.1 ml 15.96 ml/m LA Biplane Vol: 35.1 ml 12.42 ml/m  AORTIC VALVE             PULMONIC VALVE LVOT Vmax:   102.00 cm/s PV Vmax:       0.86 m/s LVOT Vmean:  76.100 cm/s PV Peak grad:  2.9 mmHg LVOT VTI:    0.181 m  AORTA Ao Root diam: 3.30 cm Ao Asc diam:  3.50 cm MITRAL VALVE                TRICUSPID VALVE MV Area (PHT): 3.77 cm     TR Peak grad:   17.6 mmHg MV Decel Time: 201 msec     TR Vmax:        210.00 cm/s MV  E velocity: 103.00 cm/s MV A velocity: 48.00 cm/s   SHUNTS MV E/A ratio:  2.15         Systemic VTI:  0.18 m                             Systemic Diam: 2.50 cm Buford Dresser MD Electronically signed by Buford Dresser MD  Signature Date/Time: 12/07/2021/12:12:03 PM    Final    DG Chest Port 1 View  Result Date: 12/06/2021 CLINICAL DATA:  Shortness of breath. EXAM: PORTABLE CHEST 1 VIEW COMPARISON:  December 04, 2021 FINDINGS: Enlarged cardiac silhouette. Similar in appearance mixed pattern pulmonary edema and/or atelectasis. IMPRESSION: Similar in appearance mixed pattern pulmonary edema and/or atelectasis. Electronically Signed   By: Fidela Salisbury M.D.   On: 12/06/2021 13:43     Time coordinating discharge: Over 30 minutes    Dwyane Dee, MD  [facility] 01/04/2022, 3:10 PM

## 2022-01-04 NOTE — Plan of Care (Signed)
  Problem: Education: Goal: Knowledge of General Education information will improve Description: Including pain rating scale, medication(s)/side effects and non-pharmacologic comfort measures Outcome: Adequate for Discharge   Problem: Health Behavior/Discharge Planning: Goal: Ability to manage health-related needs will improve Outcome: Adequate for Discharge   Problem: Clinical Measurements: Goal: Ability to maintain clinical measurements within normal limits will improve Outcome: Adequate for Discharge Goal: Will remain free from infection Outcome: Adequate for Discharge Goal: Diagnostic test results will improve Outcome: Adequate for Discharge Goal: Respiratory complications will improve Outcome: Adequate for Discharge Goal: Cardiovascular complication will be avoided Outcome: Adequate for Discharge   Problem: Activity: Goal: Risk for activity intolerance will decrease Outcome: Adequate for Discharge   Problem: Nutrition: Goal: Adequate nutrition will be maintained Outcome: Adequate for Discharge   Problem: Coping: Goal: Level of anxiety will decrease Outcome: Adequate for Discharge   Problem: Elimination: Goal: Will not experience complications related to bowel motility Outcome: Adequate for Discharge Goal: Will not experience complications related to urinary retention Outcome: Adequate for Discharge   Problem: Pain Managment: Goal: General experience of comfort will improve Outcome: Adequate for Discharge   Problem: Safety: Goal: Ability to remain free from injury will improve Outcome: Adequate for Discharge   Problem: Skin Integrity: Goal: Risk for impaired skin integrity will decrease Outcome: Adequate for Discharge   Problem: Education: Goal: Ability to demonstrate management of disease process will improve Outcome: Adequate for Discharge Goal: Ability to verbalize understanding of medication therapies will improve Outcome: Adequate for Discharge Goal:  Individualized Educational Video(s) Outcome: Adequate for Discharge   Problem: Activity: Goal: Capacity to carry out activities will improve Outcome: Adequate for Discharge   Problem: Cardiac: Goal: Ability to achieve and maintain adequate cardiopulmonary perfusion will improve Outcome: Adequate for Discharge   Problem: Education: Goal: Ability to describe self-care measures that may prevent or decrease complications (Diabetes Survival Skills Education) will improve Outcome: Adequate for Discharge Goal: Individualized Educational Video(s) Outcome: Adequate for Discharge   Problem: Coping: Goal: Ability to adjust to condition or change in health will improve Outcome: Adequate for Discharge   Problem: Fluid Volume: Goal: Ability to maintain a balanced intake and output will improve Outcome: Adequate for Discharge   Problem: Health Behavior/Discharge Planning: Goal: Ability to identify and utilize available resources and services will improve Outcome: Adequate for Discharge Goal: Ability to manage health-related needs will improve Outcome: Adequate for Discharge   Problem: Metabolic: Goal: Ability to maintain appropriate glucose levels will improve Outcome: Adequate for Discharge   Problem: Nutritional: Goal: Maintenance of adequate nutrition will improve Outcome: Adequate for Discharge Goal: Progress toward achieving an optimal weight will improve Outcome: Adequate for Discharge   Problem: Skin Integrity: Goal: Risk for impaired skin integrity will decrease Outcome: Adequate for Discharge   Problem: Tissue Perfusion: Goal: Adequacy of tissue perfusion will improve Outcome: Adequate for Discharge   

## 2022-10-08 ENCOUNTER — Emergency Department (HOSPITAL_BASED_OUTPATIENT_CLINIC_OR_DEPARTMENT_OTHER): Payer: No Typology Code available for payment source

## 2022-10-08 ENCOUNTER — Other Ambulatory Visit: Payer: Self-pay

## 2022-10-08 ENCOUNTER — Encounter (HOSPITAL_BASED_OUTPATIENT_CLINIC_OR_DEPARTMENT_OTHER): Payer: Self-pay | Admitting: Pediatrics

## 2022-10-08 ENCOUNTER — Emergency Department (HOSPITAL_BASED_OUTPATIENT_CLINIC_OR_DEPARTMENT_OTHER)
Admission: EM | Admit: 2022-10-08 | Discharge: 2022-10-08 | Disposition: A | Discharge status: Home / Self Care | Payer: No Typology Code available for payment source | Attending: Emergency Medicine | Admitting: Emergency Medicine

## 2022-10-08 DIAGNOSIS — Z20822 Contact with and (suspected) exposure to covid-19: Secondary | ICD-10-CM | POA: Insufficient documentation

## 2022-10-08 DIAGNOSIS — R0601 Orthopnea: Secondary | ICD-10-CM | POA: Diagnosis not present

## 2022-10-08 DIAGNOSIS — M25562 Pain in left knee: Secondary | ICD-10-CM | POA: Diagnosis not present

## 2022-10-08 DIAGNOSIS — J45909 Unspecified asthma, uncomplicated: Secondary | ICD-10-CM | POA: Insufficient documentation

## 2022-10-08 DIAGNOSIS — I509 Heart failure, unspecified: Secondary | ICD-10-CM | POA: Insufficient documentation

## 2022-10-08 DIAGNOSIS — M25561 Pain in right knee: Secondary | ICD-10-CM | POA: Diagnosis not present

## 2022-10-08 DIAGNOSIS — R0600 Dyspnea, unspecified: Secondary | ICD-10-CM

## 2022-10-08 DIAGNOSIS — E119 Type 2 diabetes mellitus without complications: Secondary | ICD-10-CM | POA: Diagnosis not present

## 2022-10-08 LAB — COMPREHENSIVE METABOLIC PANEL
ALT: 16 U/L (ref 0–44)
AST: 21 U/L (ref 15–41)
Albumin: 3.2 g/dL — ABNORMAL LOW (ref 3.5–5.0)
Alkaline Phosphatase: 65 U/L (ref 38–126)
Anion gap: 9 (ref 5–15)
BUN: 12 mg/dL (ref 6–20)
CO2: 30 mmol/L (ref 22–32)
Calcium: 8.5 mg/dL — ABNORMAL LOW (ref 8.9–10.3)
Chloride: 95 mmol/L — ABNORMAL LOW (ref 98–111)
Creatinine, Ser: 0.75 mg/dL (ref 0.44–1.00)
GFR, Estimated: >60 mL/min (ref >60)
Glucose, Bld: 118 mg/dL — ABNORMAL HIGH (ref 70–99)
Potassium: 3.6 mmol/L (ref 3.5–5.1)
Sodium: 134 mmol/L — ABNORMAL LOW (ref 135–145)
Total Bilirubin: 0.6 mg/dL (ref 0.3–1.2)
Total Protein: 7.8 g/dL (ref 6.5–8.1)

## 2022-10-08 LAB — CBC WITH DIFFERENTIAL/PLATELET
Abs Immature Granulocytes: 0.02 10*3/uL (ref 0.00–0.07)
Basophils Absolute: 0 10*3/uL (ref 0.0–0.1)
Basophils Relative: 0 %
Eosinophils Absolute: 0.1 10*3/uL (ref 0.0–0.5)
Eosinophils Relative: 2 %
HCT: 44.1 % (ref 36.0–46.0)
Hemoglobin: 13.7 g/dL (ref 12.0–15.0)
Immature Granulocytes: 0 %
Lymphocytes Relative: 18 %
Lymphs Abs: 1.3 10*3/uL (ref 0.7–4.0)
MCH: 29.3 pg (ref 26.0–34.0)
MCHC: 31.1 g/dL (ref 30.0–36.0)
MCV: 94.2 fL (ref 80.0–100.0)
Monocytes Absolute: 0.3 10*3/uL (ref 0.1–1.0)
Monocytes Relative: 4 %
Neutro Abs: 5.4 10*3/uL (ref 1.7–7.7)
Neutrophils Relative %: 76 %
Platelets: 172 10*3/uL (ref 150–400)
RBC: 4.68 MIL/uL (ref 3.87–5.11)
RDW: 14.4 % (ref 11.5–15.5)
WBC: 7 10*3/uL (ref 4.0–10.5)
nRBC: 0 % (ref 0.0–0.2)

## 2022-10-08 LAB — RESP PANEL BY RT-PCR (RSV, FLU A&B, COVID)  RVPGX2
Influenza A by PCR: NEGATIVE
Influenza B by PCR: NEGATIVE
Resp Syncytial Virus by PCR: NEGATIVE
SARS Coronavirus 2 by RT PCR: NEGATIVE

## 2022-10-08 LAB — BRAIN NATRIURETIC PEPTIDE: B Natriuretic Peptide: 36.8 pg/mL (ref 0.0–100.0)

## 2022-10-08 LAB — LACTIC ACID, PLASMA: Lactic Acid, Venous: 0.9 mmol/L (ref 0.5–1.9)

## 2022-10-08 LAB — MAGNESIUM: Magnesium: 2 mg/dL (ref 1.7–2.4)

## 2022-10-08 LAB — TROPONIN I (HIGH SENSITIVITY): Troponin I (High Sensitivity): <2 ng/L (ref <18)

## 2022-10-08 MED ORDER — KETOROLAC TROMETHAMINE 15 MG/ML IJ SOLN
15.0000 mg | Freq: Once | INTRAMUSCULAR | Status: AC
  Administered 2022-10-08: 15 mg via INTRAMUSCULAR
  Filled 2022-10-08: qty 1

## 2022-10-08 NOTE — Discharge Instructions (Addendum)
Your knee x-ray did not show any concerning findings.  Likely acute on chronic flareup after the fall.  You received a shot of Toradol.  Follow-up with sports medicine.  Blood work, chest x-ray were all reassuring from the standpoint of shortness of breath.  Continue to wear your home oxygen.  If any concerning symptoms return to the emergency department.

## 2022-10-08 NOTE — ED Provider Notes (Signed)
[facility] Provider Note   CSN: [id number] Arrival date & time: 10/08/22  1301     History  Chief Complaint  Patient presents with   Knee Pain    Renee Paul is a 37 y.o. female.  37 year old female with past medical history significant for CHF presents today for concern of bilateral knee pain.  She states about 1 month ago she had a mechanical fall where she fell forward.  Denies loss of consciousness or other head injury.  She states since then she has had progressively worsening bilateral knee pain worse over the past week.  She has used [employer] but nothing else over-the-counter.  She reports compliance with home medications including Lasix.  She is supposed to be taking Lasix 40 mg twice daily however she takes 80 mg once daily.  She reports chronic 5 pillow orthopnea.  States she does not feel short of breath.  Denies worsening peripheral edema.  Supposed to be on chronic supplemental O2 however states she does not feel she needs this currently.  She denies chest pain, lightheadedness.  The history is provided by the patient. No language interpreter was used.       Home Medications Prior to Admission medications   Medication Sig Start Date End Date Taking? Authorizing Provider  albuterol (PROVENTIL HFA;VENTOLIN HFA) 108 (90 Base) MCG/ACT inhaler Inhale 2 puffs into the lungs every 4 (four) hours as needed for wheezing or shortness of breath. Patient not taking: Reported on 01/02/2022 09/06/16   Gilda Crease, MD  budesonide (PULMICORT) 0.25 MG/2ML nebulizer solution Take 2 mLs (0.25 mg total) by nebulization 2 (two) times daily. Patient not taking: Reported on 01/02/2022 11/06/21   Joseph Art, DO  ferrous sulfate 325 (65 FE) MG tablet Take 1 tablet (325 mg total) by mouth daily with breakfast. 01/04/22   Lewie Chamber, MD  furosemide (LASIX) 40 MG tablet Take 1 tablet (40 mg total) by mouth daily. Take extra dose in  afternoon if worsening swelling in legs/arms or 3-5 lb weight gain in 24 hours. 01/04/22   Lewie Chamber, MD  metFORMIN (GLUCOPHAGE) 500 MG tablet Take 1 tablet (500 mg total) by mouth daily with breakfast. Patient not taking: Reported on 01/02/2022 11/09/21 11/09/22  Joseph Art, DO      Allergies    Patient has no known allergies.    Review of Systems   Review of Systems  Constitutional:  Negative for fever.  Respiratory:  Positive for shortness of breath.   Cardiovascular:  Negative for chest pain, palpitations and leg swelling.  Gastrointestinal:  Negative for abdominal pain, nausea and vomiting.  Musculoskeletal:  Positive for arthralgias. Negative for joint swelling.  Neurological:  Negative for light-headedness.  All other systems reviewed and are negative.   Physical Exam Updated Vital Signs BP (!) 149/92 (BP Location: Left Arm)   Pulse 87   Temp 99 F (37.2 C) (Oral)   Resp 18   Ht 5\' 4"  (1.626 m)   Wt (!) 214.6 kg   SpO2 (!) 89%   BMI 81.21 kg/m  Physical Exam Vitals and nursing note reviewed.  Constitutional:      General: She is not in acute distress.    Appearance: Normal appearance. She is not ill-appearing.  HENT:     Head: Normocephalic and atraumatic.     Nose: Nose normal.  Eyes:     General: No scleral icterus.    Extraocular Movements: Extraocular movements intact.  Conjunctiva/sclera: Conjunctivae normal.  Cardiovascular:     Rate and Rhythm: Normal rate and regular rhythm.     Heart sounds: Normal heart sounds.  Pulmonary:     Effort: Pulmonary effort is normal. No respiratory distress.     Breath sounds: Normal breath sounds. No wheezing or rales.  Abdominal:     General: There is no distension.     Palpations: Abdomen is soft.     Tenderness: There is no abdominal tenderness. There is no guarding.  Musculoskeletal:        General: Normal range of motion.     Cervical back: Normal range of motion.     Right lower leg: No edema.      Left lower leg: No edema.  Skin:    General: Skin is warm and dry.  Neurological:     General: No focal deficit present.     Mental Status: She is alert. Mental status is at baseline.     ED Results / Procedures / Treatments   Labs (all labs ordered are listed, but only abnormal results are displayed) Labs Reviewed  RESP PANEL BY RT-PCR (RSV, FLU A&B, COVID)  RVPGX2  BRAIN NATRIURETIC PEPTIDE  CBC WITH DIFFERENTIAL/PLATELET  COMPREHENSIVE METABOLIC PANEL  LACTIC ACID, PLASMA  LACTIC ACID, PLASMA  MAGNESIUM  I-STAT VENOUS BLOOD GAS, ED  TROPONIN I (HIGH SENSITIVITY)    EKG None  Radiology No results found.  Procedures Procedures    Medications Ordered in ED Medications - No data to display  ED Course/ Medical Decision Making/ A&P                             Medical Decision Making Amount and/or Complexity of Data Reviewed Labs: ordered. Radiology: ordered.   Medical Decision Making / ED Course   This patient presents to the ED for concern of bilateral knee pain, hypoxia, this involves an extensive number of treatment options, and is a complaint that carries with it a high risk of complications and morbidity.  The differential diagnosis includes PE CHF exacerbation, asthma exacerbation, viral URI, osteoarthritis, knee fracture, septic joint  MDM: 37 year old female with past medical history significant for CHF presents today for concern of bilateral knee pain.  During evaluation she was found to be hypoxic at 85% on room air.  She denies feeling short of breath however she does have conversational dyspnea.  No pitting edema bilaterally.  Denies chest pain or other anginal symptoms.  She is agreeable to workup. [facility], Chest X-Ray, EKG. Bilateral knee x-ray obtained through triage.  Patient is supposed to be wearing 3-4 L supplemental O2 at baseline.  Not always compliant.  She was placed on 2 L supplemental O2 with recovery of O2 sats to mid 90s.  Workup as  above reassuring.  CBC is unremarkable.  CMP shows preserved renal function, no significant derangements in electrolytes.  Magnesium 2.0.  Lactic acid is normal.  BNP is within normal limits.  Initial troponin undetectable.  COVID, flu, and RSV negative.  Chest x-ray without significant signs of volume overload.  EKG without acute ischemic changes.  No suspicion for ACS.  No prior history of DVT or PE.  Hypoxia likely related to patient's noncompliance with her supplemental O2.  She is without chest pain.  She has not tachycardic or hypoxic on her baseline supplemental O2.  No suspicion for PE.  Bilateral knee x-rays without acute process.  Will give shot  of Toradol and referral to sports medicine.  She is in agreement with this plan.  Discharged in stable condition.   Lab Tests: -I ordered, reviewed, and interpreted labs.   The pertinent results include:   Labs Reviewed  RESP PANEL BY RT-PCR (RSV, FLU A&B, COVID)  RVPGX2  BRAIN NATRIURETIC PEPTIDE  CBC WITH DIFFERENTIAL/PLATELET  COMPREHENSIVE METABOLIC PANEL  LACTIC ACID, PLASMA  LACTIC ACID, PLASMA  MAGNESIUM  I-STAT VENOUS BLOOD GAS, ED  TROPONIN I (HIGH SENSITIVITY)      EKG  EKG Interpretation Date/Time:    Ventricular Rate:    PR Interval:    QRS Duration:    QT Interval:    QTC Calculation:   R Axis:      Text Interpretation:           Imaging Studies ordered: I ordered imaging studies including cxr, bilateral knee x rays I independently visualized and interpreted imaging. I agree with the radiologist interpretation   Medicines ordered and prescription drug management: No orders of the defined types were placed in this encounter.   -I have reviewed the patients home medicines and have made adjustments as needed   Cardiac Monitoring: The patient was maintained on a cardiac monitor.  I personally viewed and interpreted the cardiac monitored which showed an underlying rhythm of: NSR   Reevaluation: After the  interventions noted above, I reevaluated the patient and found that they have :improved  Co morbidities that complicate the patient evaluation  Past Medical History:  Diagnosis Date   Asthma    CHF (congestive heart failure) (HCC)    Morbid obesity (HCC)    Type 2 diabetes mellitus (HCC) 01/02/2022      Dispostion: Discharged in stable condition.  Return precaution discussed.  Patient voices understanding and is in agreement with plan.  Final Clinical Impression(s) / ED Diagnoses Final diagnoses:  Pain in both knees, unspecified chronicity  Dyspnea, unspecified type    Rx / DC Orders ED Discharge Orders     None         Marita Kansas, PA-C 10/08/22 1618    Ernie Avena, MD 10/09/22 1626

## 2022-10-08 NOTE — ED Triage Notes (Signed)
C/O bilateral knee pain; reports has a fall within the last 3 months, but her knee pain has worsen with pain even at rest. Patient reports on home o2 for CHF, but does not have it on right now because she does not feel short of breathe

## 2023-12-28 NOTE — ED Provider Notes (Addendum)
 Emergency Department Provider Note  Provider at bedside: 1:23 AM  History obtained from the patient  History   Chief Complaint  Patient presents with  . Insect Bite     Insect bite.  Patient states she saw a spider on the wall and her husband took care of the spider but then she think she got bit by another spider because her back started to hurt.  She went to [place] had a CT scan and labs that showed no acute findings.   Rash Location:  Torso (bug bite to back) Severity:  Moderate Onset quality:  Gradual Duration:  4 days Timing:  Constant Chronicity:  New Context: insect bite/sting   Relieved by:  Nothing Worsened by:  Nothing Ineffective treatments:  None tried Associated symptoms: no fever     No LMP recorded.   Past Medical History Medical History[1]  Past Surgical History Surgical History[2]   Medications Home Medications           * dapagliflozin propanediol (Farxiga) 10 mg tab tablet   * ferrous sulfate  325 mg (65 mg iron) tablet   * furosemide  (LASIX ) 40 mg tablet   * spironolactone  (ALDACTONE ) 25 mg tablet         Allergies Allergies[3]   Family History Family History[4]   Social History Social History   Socioeconomic History  . Marital status: Married    Spouse name: Not on file  . Number of children: Not on file  . Years of education: Not on file  . Highest education level: Not on file  Occupational History  . Not on file  Tobacco Use  . Smoking status: Some Days    Current packs/day: 0.25    Types: Cigarettes  . Smokeless tobacco: Never  Substance and Sexual Activity  . Alcohol use: Yes  . Drug use: No  . Sexual activity: Not on file  Other Topics Concern  . Not on file  Social History Narrative   ** Merged History Encounter **       Was experiencing homelessness, but now has a home and lives with husband. Works at [employer] Insecurity: Food Insecurity Present (07/28/2023)    Received from [facility]   Food vital sign   . Within the past 12 months, you worried that your food would run out before you got money to buy more: Sometimes true   . Within the past 12 months, the food you bought just didn't last and you didn't have money to get more: Sometimes true  Transportation Needs: Unmet Transportation Needs (07/28/2023)   Received from [facility]   PRAPARE - Transportation   . Lack of Transportation (Medical): Yes   . Lack of Transportation (Non-Medical): Yes  Safety: Not At Risk (12/25/2023)   [facility]   HITS   . Over the last 12 months how often did your partner physically hurt you?: Never   . Over the last 12 months how often did your partner insult you or talk down to you?: Never   . Over the last 12 months how often did your partner threaten you with physical harm?: Never   . Over the last 12 months how often did your partner scream or curse at you?: Never  Living Situation: High Risk (07/28/2023)   Received from [facility] Situation   . In the last 12 months, was there a time when you were not able to pay  the mortgage or rent on time?: Yes   . In the past 12 months, how many times have you moved where you were living?: 1   . At any time in the past 12 months, were you homeless or living in a shelter (including now)?: No    Review of Systems  Review of Systems  Constitutional:  Negative for fever.  Skin:  Positive for rash.     Physical Exam   Vitals:   12/28/23 0114 12/28/23 0115 12/28/23 0116 12/28/23 0121  BP:   137/82   BP Location:   Right arm   Patient Position:   Sitting   Pulse: 102     Resp:   18   Temp:    99.1 F (37.3 C)  TempSrc:    Oral  SpO2: 92%     Weight:  (!) 229 kg (504 lb)    Height:  162.6 cm (5' 4)      Physical Exam Nursing note and vitals reviewed.  Physical Exam General: Patient is in no acute distress Head: Normocephalic and atraumatic. Neck: Supple without  meningismus Respiratory: Effort normal, no respiratory distress. Abdomen: Soft, non-tender, non-distended, no mass. CV: Heart rate normal and regular rhythm MSK: Extremities are atraumatic without deformity, ROM intact, no edema Neuro: Alert and oriented, no motor deficit or sensory deficit. Psychiatric: Mood and affect are normal. Skin: small erythematous area on back.  Labs   Lab Results (last 24 hours)     ** No results found for the last 24 hours. **        Radiology        Procedure Note  Procedures   Medical Decision Making    Medical Decision Making Minimal induration.  Not an abscess.  Looks to be an infected insect bite.  Placed on Doxy  Problems Addressed: Bug bite, initial encounter: complicated acute illness or injury  Risk Prescription drug management.     ED Clinical Impression   1. Bug bite, initial encounter     ED Assessment/Plan   DISCHARGE MEDICATIONS   Medication List     START taking these medications    doxycycline  100 mg tablet Commonly known as: VIBRA -TABS Take 1 tablet (100 mg total) by mouth 2 (two) times a day for 10 days. Take with 8 oz water. Do not lie down for at least 30 minutes after.       ASK your doctor about these medications    Farxiga 10 mg Tab tablet Generic drug: dapagliflozin propanediol Take 10 mg by mouth Once Daily.   ferrous sulfate  325 mg (65 mg iron) tablet Take 325 mg by mouth daily with breakfast.   furosemide  40 mg tablet Commonly known as: LASIX  Take 80 mg by mouth Once Daily.   spironolactone  25 mg tablet Commonly known as: ALDACTONE  Take 1 tablet by mouth daily.         Where to Get Your Medications     These medications were sent to [id number] - [place], [place] - [address] - PHONE: [phone] - FAX: [phone]  [address], [place] [address]    Phone: [phone]  doxycycline  100 mg tablet     FOLLOW UP No follow-up  provider specified.  ED Disposition     None            [1] Past Medical History: Diagnosis Date  . Acute on chronic heart failure with preserved ejection fraction    (  CMD) 07/09/2019  . Anxiety and depression   . Asthma (CMD)   . BMI 70 and over, adult (CMD)   . DUB (dysfunctional uterine bleeding)   . Iron deficiency anemia due to chronic blood loss 07/09/2019  . OSA (obstructive sleep apnea)    could not tolerate CPAP; wears O2 2-3 liters while sleeping  . Panic attacks   . Tobacco abuse 02/13/2016  . Transfusion history 2021  . Ventral hernia   [2] Past Surgical History: Procedure Laterality Date  . CESAREAN SECTION W/ TUBAL LIGATION  2013   Procedure: CESAREAN SECTION W/ TUBAL LIGATION  . CESAREAN SECTION, UNSPECIFIED  2006   Procedure: CESAREAN SECTION  . CESAREAN SECTION, UNSPECIFIED  2005   Procedure: CESAREAN SECTION  . DILATION AND CURETTAGE OF UTERUS N/A 05/24/2021   Procedure: HYSTEROSCOPY W/ DILATION  & CURETTAGE, Mirena IUD placement;  Surgeon: Bridgette Comer Lesches, MD;  Location: [facility] MAIN OR;  Service: Gynecology;  Laterality: N/A;  Dr. Shelley   BMI 79  . HYSTEROSCOPY  2012   Procedure: HYSTEROSCOPY; to remove embedded IUD  . LAPAROSCOPIC CHOLECYSTECTOMY W/ CHOLANGIOGRAPHY N/A 02/14/2016   Procedure: CHOLECYSTECTOMY LAPAROSCOPIC;  Surgeon: Harlene Macario Schultze, MD;  Location: [facility] MAIN OR;  Service: General;  Laterality: N/A;  [3] Allergies Allergen Reactions  . Adhesive Tape-Silicones Other (See Comments)    Skin irritation  [4] Family History Problem Relation Name Age of Onset  . Ovarian cancer Mother  78       Passed away from this  . Anemia Mother    . Diabetes Father    . Neuropathy Father    . Breast cancer Maternal Grandmother    . Bone cancer Maternal Grandmother    . Coronary artery disease Paternal Grandmother    . Heart failure Paternal Grandmother         Died from CHF  . Colon cancer Neg Hx    . Uterine cancer Neg Hx    *Some  images could not be shown.

## 2024-01-01 ENCOUNTER — Emergency Department (HOSPITAL_BASED_OUTPATIENT_CLINIC_OR_DEPARTMENT_OTHER)
Admission: EM | Admit: 2024-01-01 | Discharge: 2024-01-02 | Disposition: A | Discharge status: Home / Self Care | Attending: Emergency Medicine | Admitting: Emergency Medicine

## 2024-01-01 ENCOUNTER — Encounter (HOSPITAL_BASED_OUTPATIENT_CLINIC_OR_DEPARTMENT_OTHER): Payer: Self-pay

## 2024-01-01 ENCOUNTER — Other Ambulatory Visit: Payer: Self-pay

## 2024-01-01 ENCOUNTER — Emergency Department (HOSPITAL_BASED_OUTPATIENT_CLINIC_OR_DEPARTMENT_OTHER)

## 2024-01-01 DIAGNOSIS — Z7984 Long term (current) use of oral hypoglycemic drugs: Secondary | ICD-10-CM | POA: Diagnosis not present

## 2024-01-01 DIAGNOSIS — R1011 Right upper quadrant pain: Secondary | ICD-10-CM | POA: Diagnosis not present

## 2024-01-01 DIAGNOSIS — R10A1 Flank pain, right side: Secondary | ICD-10-CM | POA: Insufficient documentation

## 2024-01-01 DIAGNOSIS — J45909 Unspecified asthma, uncomplicated: Secondary | ICD-10-CM | POA: Diagnosis not present

## 2024-01-01 DIAGNOSIS — I5033 Acute on chronic diastolic (congestive) heart failure: Secondary | ICD-10-CM | POA: Diagnosis not present

## 2024-01-01 DIAGNOSIS — E119 Type 2 diabetes mellitus without complications: Secondary | ICD-10-CM | POA: Insufficient documentation

## 2024-01-01 LAB — COMPREHENSIVE METABOLIC PANEL WITH GFR
ALT: 10 U/L (ref 0–44)
AST: 14 U/L — ABNORMAL LOW (ref 15–41)
Albumin: 3.6 g/dL (ref 3.5–5.0)
Alkaline Phosphatase: 60 U/L (ref 38–126)
Anion gap: 8 (ref 5–15)
BUN: 13 mg/dL (ref 6–20)
CO2: 33 mmol/L — ABNORMAL HIGH (ref 22–32)
Calcium: 8.9 mg/dL (ref 8.9–10.3)
Chloride: 97 mmol/L — ABNORMAL LOW (ref 98–111)
Creatinine, Ser: 0.69 mg/dL (ref 0.44–1.00)
GFR, Estimated: >60 mL/min (ref >60)
Glucose, Bld: 159 mg/dL — ABNORMAL HIGH (ref 70–99)
Potassium: 4.5 mmol/L (ref 3.5–5.1)
Sodium: 137 mmol/L (ref 135–145)
Total Bilirubin: 0.3 mg/dL (ref 0.0–1.2)
Total Protein: 8.1 g/dL (ref 6.5–8.1)

## 2024-01-01 LAB — CBC
HCT: 35.9 % — ABNORMAL LOW (ref 36.0–46.0)
Hemoglobin: 10.3 g/dL — ABNORMAL LOW (ref 12.0–15.0)
MCH: 26.7 pg (ref 26.0–34.0)
MCHC: 28.7 g/dL — ABNORMAL LOW (ref 30.0–36.0)
MCV: 93 fL (ref 80.0–100.0)
Platelets: 251 K/uL (ref 150–400)
RBC: 3.86 MIL/uL — ABNORMAL LOW (ref 3.87–5.11)
RDW: 16 % — ABNORMAL HIGH (ref 11.5–15.5)
WBC: 5.8 K/uL (ref 4.0–10.5)
nRBC: 0 % (ref 0.0–0.2)

## 2024-01-01 LAB — LIPASE, BLOOD: Lipase: 22 U/L (ref 11–51)

## 2024-01-01 LAB — HCG, SERUM, QUALITATIVE: Preg, Serum: NEGATIVE

## 2024-01-01 MED ORDER — IOHEXOL 300 MG/ML  SOLN
125.0000 mL | Freq: Once | INTRAMUSCULAR | Status: AC | PRN
  Administered 2024-01-01: 125 mL via INTRAVENOUS

## 2024-01-01 MED ORDER — ACETAMINOPHEN 500 MG PO TABS
1000.0000 mg | ORAL_TABLET | Freq: Once | ORAL | Status: AC
Start: 2024-01-01 — End: 2024-01-01
  Administered 2024-01-01: 1000 mg via ORAL
  Filled 2024-01-01: qty 2

## 2024-01-01 NOTE — ED Provider Notes (Incomplete)
  [place] [facility] Provider Note   CSN: [id number] Arrival date & time: 01/01/24  2004     Patient presents with: Abdominal Pain   Renee Paul is a 38 y.o. female.  RUQ abd. Pain x 1 week episodic, not currently hurting, spider bite on back 12/28/2023  {Add pertinent medical, surgical, social history, OB history to HPI:32947}  Abdominal Pain      Prior to Admission medications   Medication Sig Start Date End Date Taking? Authorizing Provider  albuterol  (PROVENTIL  HFA;VENTOLIN  HFA) 108 (90 Base) MCG/ACT inhaler Inhale 2 puffs into the lungs every 4 (four) hours as needed for wheezing or shortness of breath. Patient not taking: Reported on 01/02/2022 09/06/16   Haze Lonni PARAS, MD  budesonide  (PULMICORT ) 0.25 MG/2ML nebulizer solution Take 2 mLs (0.25 mg total) by nebulization 2 (two) times daily. Patient not taking: Reported on 01/02/2022 11/06/21   Vann, Jessica U, DO  ferrous sulfate  325 (65 FE) MG tablet Take 1 tablet (325 mg total) by mouth daily with breakfast. 01/04/22   Patsy Lenis, MD  furosemide  (LASIX ) 40 MG tablet Take 1 tablet (40 mg total) by mouth daily. Take extra dose in afternoon if worsening swelling in legs/arms or 3-5 lb weight gain in 24 hours. 01/04/22   Patsy Lenis, MD  metFORMIN  (GLUCOPHAGE ) 500 MG tablet Take 1 tablet (500 mg total) by mouth daily with breakfast. Patient not taking: Reported on 01/02/2022 11/09/21 11/09/22  Vann, Jessica U, DO    Allergies: Patient has no known allergies.    Review of Systems  Gastrointestinal:  Positive for abdominal pain.    Updated Vital Signs BP (!) 140/86 (BP Location: Left Arm)   Pulse 91   Temp (!) 97.4 F (36.3 C)   Resp (!) 26   LMP 12/15/2023 (Approximate)   SpO2 97%   Physical Exam  (all labs ordered are listed, but only abnormal results are displayed) Labs Reviewed - No data to display  EKG: None  Radiology: No results found.  {Document  cardiac monitor, telemetry assessment procedure when appropriate:32947} Procedures   Medications Ordered in the ED - No data to display    {Click here for ABCD2, HEART and other calculators REFRESH Note before signing:1}                              Medical Decision Making  ***  {Document critical care time when appropriate  Document review of labs and clinical decision tools ie CHADS2VASC2, etc  Document your independent review of radiology images and any outside records  Document your discussion with family members, caretakers and with consultants  Document social determinants of health affecting pt's care  Document your decision making why or why not admission, treatments were needed:32947:::1}   Final diagnoses:  None    ED Discharge Orders     None

## 2024-01-01 NOTE — Discharge Instructions (Addendum)
 It was a pleasure taking care of you today.  Based on your history, physical exam, labs, and imaging I feel you are safe for discharge.  Unfortunately today there was no etiology for your right flank and right side pain found other than your possible spider bite.  I do recommend that you continue your course of doxycycline  and complete the entire course.  Please continue to monitor your symptoms if you experience any of the following symptoms including but not limited to fever, chills, chest pain, shortness of breath, worsening abdominal pain, worsening back pain, signs of infection around spider bite, excessive nausea/vomiting, urinary or bowel symptoms, or other concerning symptom please return to the emergency department or seek further medical care.  On your CT scan it does look like you have what we call an umbilical hernia, please make your primary care provider aware of your workup today and all findings including imaging findings.  For your pain you may continue to take over-the-counter Tylenol  and/or ibuprofen , please keep in mind that the max daily dose of Tylenol  is 4000 mg/day and please do not exceed 1000 mg in a single dose.  Please do not exceed 6 to 800 mg of ibuprofen  in a single dose.  If you feel like your previously prescribed cyclobenzaprine was helpful you may continue to take it as needed as previously prescribed, however please remember it may make you drowsy so please do not drive or operate heavy machinery. Please follow-up as scheduled with your primary care provider on the 20th of this month or sooner if symptoms warrant.  If symptoms persist or worsen recommend follow-up within 48 hours.

## 2024-01-01 NOTE — ED Triage Notes (Signed)
 Pt to rm 7 via WC from [facility] for worsening RUQ pain that radiates all over Pt reports something doesn't feel right states she has been on abx's for a week for a spider bite. Pain started before and during abx tx. Denies diarrhea, c/o constipation. I feel faint, and my stomach feels heavy

## 2024-01-01 NOTE — ED Provider Notes (Signed)
 [place] [facility] Provider Note   CSN: [id number] Arrival date & time: 01/01/24  2004     Patient presents with: Abdominal Pain   Renee Paul is a 38 y.o. female who presents the emergency department with a chief complaint of right upper quadrant abdominal pain as well as right sided flank pain.  Patient states that she was recently seen at another facility for this right flank pain back on 12/25/23. Patient ultimately had a noncontrast CT scan completed to rule out a kidney stone and patient states that nothing was found.  Patient was subsequently seen once again for a bug bite on her right flank area which she believes was a spider, patient was given prescription for doxycycline  antibiotics and is on her third day of treatment.  Denies fever, chills, chest pain, shortness of breath.  Patient denies nausea/vomiting or urinary symptoms. Patient states that the flank pain wrapping around to her right side did start at roughly the same time as the bug bite.  Patient states that she is on oxygen at baseline at home, 3-4L.  Past medical history significant for acute on chronic diastolic heart failure, symptomatic anemia, chronic respiratory failure with hypoxia and hypercapnia, obstructive sleep apnea, asthma, morbid obesity, type 2 diabetes, etc. Patient has had previous cholecystectomy.     Abdominal Pain      Prior to Admission medications   Medication Sig Start Date End Date Taking? Authorizing Provider  albuterol  (PROVENTIL  HFA;VENTOLIN  HFA) 108 (90 Base) MCG/ACT inhaler Inhale 2 puffs into the lungs every 4 (four) hours as needed for wheezing or shortness of breath. Patient not taking: Reported on 01/02/2022 09/06/16   Haze Lonni PARAS, MD  budesonide  (PULMICORT ) 0.25 MG/2ML nebulizer solution Take 2 mLs (0.25 mg total) by nebulization 2 (two) times daily. Patient not taking: Reported on 01/02/2022 11/06/21   Vann, Jessica U, DO  ferrous  sulfate 325 (65 FE) MG tablet Take 1 tablet (325 mg total) by mouth daily with breakfast. 01/04/22   Patsy Lenis, MD  furosemide  (LASIX ) 40 MG tablet Take 1 tablet (40 mg total) by mouth daily. Take extra dose in afternoon if worsening swelling in legs/arms or 3-5 lb weight gain in 24 hours. 01/04/22   Patsy Lenis, MD  metFORMIN  (GLUCOPHAGE ) 500 MG tablet Take 1 tablet (500 mg total) by mouth daily with breakfast. Patient not taking: Reported on 01/02/2022 11/09/21 11/09/22  Vann, Jessica U, DO    Allergies: Patient has no known allergies.    Review of Systems  Gastrointestinal:  Positive for abdominal pain.    Updated Vital Signs BP 135/65   Pulse 89   Temp 98.1 F (36.7 C) (Oral)   Resp 20   LMP 12/15/2023 (Approximate)   SpO2 95%   Physical Exam Vitals and nursing note reviewed.  Constitutional:      General: She is awake. She is not in acute distress.    Appearance: She is well-developed. She is obese. She is not ill-appearing, toxic-appearing or diaphoretic.  HENT:     Head: Normocephalic and atraumatic.  Eyes:     General: No scleral icterus. Cardiovascular:     Rate and Rhythm: Normal rate and regular rhythm.  Pulmonary:     Effort: Pulmonary effort is normal. No respiratory distress.     Breath sounds: Normal breath sounds. No wheezing, rhonchi or rales.  Abdominal:     Tenderness: There is abdominal tenderness in the right upper quadrant. There is no right CVA  tenderness, left CVA tenderness, guarding or rebound.     Comments: Mass felt in right periumbilical region, suspicious for hernia, non-tender to palpation  Skin:    General: Skin is warm.     Capillary Refill: Capillary refill takes less than 2 seconds.     Comments: No obvious rashes or lesions to abdomen or flank, multiple bites present to posterior right back, look well-healing, do not currently appear infected, no obvious abscess or drainage, no significant surrounding erythema  Neurological:      General: No focal deficit present.     Mental Status: She is alert and oriented to person, place, and time.  Psychiatric:        Mood and Affect: Mood normal.        Behavior: Behavior normal. Behavior is cooperative.     (all labs ordered are listed, but only abnormal results are displayed) Labs Reviewed  COMPREHENSIVE METABOLIC PANEL WITH GFR - Abnormal; Notable for the following components:      Result Value   Chloride 97 (*)    CO2 33 (*)    Glucose, Bld 159 (*)    AST 14 (*)    All other components within normal limits  CBC - Abnormal; Notable for the following components:   RBC 3.86 (*)    Hemoglobin 10.3 (*)    HCT 35.9 (*)    MCHC 28.7 (*)    RDW 16.0 (*)    All other components within normal limits  LIPASE, BLOOD  HCG, SERUM, QUALITATIVE    EKG: EKG Interpretation Date/Time:  Thursday January 01 2024 20:23:30 EDT Ventricular Rate:  88 PR Interval:  152 QRS Duration:  90 QT Interval:  394 QTC Calculation: 477 R Axis:   65  Text Interpretation: Sinus rhythm Normal ECG Confirmed by Doretha Folks ([id number]) on 01/01/2024 9:49:16 PM  Radiology: CT ABDOMEN PELVIS W CONTRAST Result Date: 01/01/2024 CLINICAL DATA:  RUQ abdominal pain, distended belly, possible hernia Hx diabetes, cesarean section, cholecystectomy, and tubal ligation. EXAM: CT ABDOMEN AND PELVIS WITH CONTRAST TECHNIQUE: Multidetector CT imaging of the abdomen and pelvis was performed using the standard protocol following bolus administration of intravenous contrast. RADIATION DOSE REDUCTION: This exam was performed according to the departmental dose-optimization program which includes automated exposure control, adjustment of the mA and/or kV according to patient size and/or use of iterative reconstruction technique. CONTRAST:  OMNIPAQUE  IOHEXOL  300 MG/ML  SOLN COMPARISON:  None Available. FINDINGS: Lower chest: No acute abnormality.  Possible tiny hiatal hernia. Hepatobiliary: No focal liver  abnormality. Status post cholecystectomy. No biliary dilatation. Pancreas: No focal lesion. Normal pancreatic contour. No surrounding inflammatory changes. No main pancreatic ductal dilatation. Spleen: Normal in size without focal abnormality. Adrenals/Urinary Tract: No adrenal nodule bilaterally. Bilateral kidneys enhance symmetrically. No hydronephrosis. No hydroureter. The urinary bladder is unremarkable. Stomach/Bowel: Stomach is within normal limits. No evidence of bowel wall thickening or dilatation. Single sigmoid colonic diverticula. The appendix is not definitely identified with no inflammatory changes in the right lower quadrant to suggest acute appendicitis. Vascular/Lymphatic: No abdominal aorta or iliac aneurysm. No abdominal, pelvic, or inguinal lymphadenopathy. Reproductive: Uterus and bilateral adnexa are unremarkable. Other: No intraperitoneal free fluid. No intraperitoneal free gas. No organized fluid collection. Musculoskeletal: Moderate volume umbilical hernia containing a short segment of mid transverse colon. No suspicious lytic or blastic osseous lesions. No acute displaced fracture. IMPRESSION: 1. Moderate volume umbilical hernia containing a short segment of mid transverse colon. No findings of associated bowel obstruction or ischemia.  2. Colonic diverticulosis with no acute diverticulitis. Electronically Signed   By: Morgane  Naveau M.D.   On: 01/01/2024 23:01     Procedures   Medications Ordered in the ED  iohexol  (OMNIPAQUE ) 300 MG/ML solution 125 mL (125 mLs Intravenous Contrast Given 01/01/24 2236)  acetaminophen  (TYLENOL ) tablet 1,000 mg (1,000 mg Oral Given 01/01/24 2318)                                    Medical Decision Making Amount and/or Complexity of Data Reviewed Labs: ordered. Radiology: ordered.  Risk OTC drugs. Prescription drug management.   Patient presents to the ED for concern of right flank pain, right upper quadrant abdominal pain, this involves  an extensive number of treatment options, and is a complaint that carries with it a high risk of complications and morbidity.  The differential diagnosis includes pyelonephritis, cholecystitis, cholangitis, shingles, sequela from bug bite, etc.   Co morbidities that complicate the patient evaluation  acute on chronic diastolic heart failure, symptomatic anemia, chronic respiratory failure with hypoxia and hypercapnia, obstructive sleep apnea, asthma, morbid obesity, type 2 diabetes   Additional history obtained:  Reviewed emergency department notes from 12/25/2023 where patient was seen for right flank pain as well as ED notes from 12/28/2023 where patient was seen for bug bite on right flank area and prescribed doxycycline   Lab Tests:  I Ordered, and personally interpreted labs.  The pertinent results include: CBC shows no elevated white blood cell count, hemoglobin of 10.3, CMP overall reassuring, no elevated AST/ALT, alk phos, total bili, creatinine is normal, no elevated lipase, serum pregnancy negative, patient unable to give urine Paul in the emergency department today, I reviewed the patient's urine Paul from previous visit on 12/25/2023 for right flank pain, at this time patient had no evidence of urinary tract infection or blood in urine   Imaging Studies ordered:  I ordered imaging studies including CT abdomen pelvis with contrast I independently visualized and interpreted imaging which showed moderate volume umbilical hernia, no evidence of strangulation or bowel obstruction, diverticulosis without diverticulitis I agree with the radiologist interpretation   Cardiac Monitoring:  The patient was maintained on a cardiac monitor.  I personally viewed and interpreted the cardiac monitored which showed an underlying rhythm of: Sinus rhythm   Medicines ordered and prescription drug management:  I ordered medication including Tylenol  for pain Reevaluation of the patient after  these medicines showed that the patient improved I have reviewed the patients home medicines and have made adjustments as needed   Test Considered:  Urinalysis: Patient unable to give Paul here in the emergency department today, I personally reviewed findings from previous emergency department visit on 12/25/2023, at this time patient's urine was not overwhelming for infection, patient denies urinary symptoms here today, I do not think waiting on urine will change plan   Critical Interventions:  None   Problem List / ED Course:  38 year old female, vital signs stable, right upper quadrant abdominal pain and right flank pain that has been ongoing for approximately 1 week, previously seen on 12/25/2023 and investigation for kidney stone was supposedly unremarkable, patient seen since then for bug bite and prescribed doxycycline  On physical exam patient overall well-appearing on baseline oxygen, vital signs stable, tenderness with palpation of right upper quadrant, no CVA tenderness, bug bite looks well-healed and not infected We will investigate with general abdominal pain workup and CT scan  of abdomen and pelvis this time with contrast Overall lab work reassuring, no elevated white blood cell count, hemoglobin 10.3, CMP reassuring, lipase 22, pregnancy negative CT abdomen pelvis does not show acute process in the abdomen, periumbilical hernia without evidence of strangulation noted, patient does not have pain with palpation of this area Patient educated on findings here in the emergency department, when patient was taken to CT patient states that pain worsened having to lay flat on table, suspicious for possible musculoskeletal etiology of pain versus sequela from spider bite, recommend over-the-counter treatment with Tylenol  and/or ibuprofen  and follow-up with primary care provider Return precautions given Patient discharged Unknown diagnosis for right flank, right side, and right upper  quadrant pain however workup here in the emergency department reassuring, I see no need for admission for this patient at this time, no evidence of kidney stone, appendicitis, exam not consistent with pyelonephritis, no elevated white blood cell count, afebrile, vitals have remained stable, patient has had previous cholecystectomy    Reevaluation:  After the interventions noted above, I reevaluated the patient and found that they have :stayed the same   Social Determinants of Health:  none   Dispostion:  After consideration of the diagnostic results and the patients response to treatment, I feel that the patent would benefit from discharge and outpatient therapy as described, follow-up with primary care provider if symptoms persist or worsen.  Recommend completing entire course of doxycycline .     Final diagnoses:  Right flank pain  Abdominal pain, RUQ    ED Discharge Orders     None          Janetta Terrall FALCON, PA-C 01/02/24 0044    Doretha Folks, MD 01/05/24 2322

## 2024-02-09 ENCOUNTER — Other Ambulatory Visit: Payer: Self-pay

## 2024-02-09 ENCOUNTER — Emergency Department (HOSPITAL_BASED_OUTPATIENT_CLINIC_OR_DEPARTMENT_OTHER)

## 2024-02-09 ENCOUNTER — Inpatient Hospital Stay (HOSPITAL_BASED_OUTPATIENT_CLINIC_OR_DEPARTMENT_OTHER)
Admission: EM | Admit: 2024-02-09 | Discharge: 2024-02-12 | DRG: 190 | Disposition: A | Discharge status: Home / Self Care | Attending: Emergency Medicine | Admitting: Emergency Medicine

## 2024-02-09 ENCOUNTER — Encounter (HOSPITAL_BASED_OUTPATIENT_CLINIC_OR_DEPARTMENT_OTHER): Payer: Self-pay

## 2024-02-09 DIAGNOSIS — G4733 Obstructive sleep apnea (adult) (pediatric): Secondary | ICD-10-CM

## 2024-02-09 DIAGNOSIS — J9621 Acute and chronic respiratory failure with hypoxia: Secondary | ICD-10-CM | POA: Diagnosis present

## 2024-02-09 DIAGNOSIS — F17210 Nicotine dependence, cigarettes, uncomplicated: Secondary | ICD-10-CM | POA: Diagnosis present

## 2024-02-09 DIAGNOSIS — I5033 Acute on chronic diastolic (congestive) heart failure: Secondary | ICD-10-CM | POA: Diagnosis present

## 2024-02-09 DIAGNOSIS — D696 Thrombocytopenia, unspecified: Secondary | ICD-10-CM | POA: Diagnosis present

## 2024-02-09 DIAGNOSIS — R739 Hyperglycemia, unspecified: Secondary | ICD-10-CM

## 2024-02-09 DIAGNOSIS — J9622 Acute and chronic respiratory failure with hypercapnia: Secondary | ICD-10-CM | POA: Diagnosis present

## 2024-02-09 DIAGNOSIS — E1165 Type 2 diabetes mellitus with hyperglycemia: Secondary | ICD-10-CM | POA: Diagnosis present

## 2024-02-09 DIAGNOSIS — J810 Acute pulmonary edema: Secondary | ICD-10-CM | POA: Diagnosis present

## 2024-02-09 DIAGNOSIS — I509 Heart failure, unspecified: Secondary | ICD-10-CM

## 2024-02-09 DIAGNOSIS — Z79899 Other long term (current) drug therapy: Secondary | ICD-10-CM | POA: Diagnosis not present

## 2024-02-09 DIAGNOSIS — J45901 Unspecified asthma with (acute) exacerbation: Secondary | ICD-10-CM | POA: Diagnosis present

## 2024-02-09 DIAGNOSIS — E662 Morbid (severe) obesity with alveolar hypoventilation: Secondary | ICD-10-CM | POA: Diagnosis present

## 2024-02-09 DIAGNOSIS — D649 Anemia, unspecified: Secondary | ICD-10-CM | POA: Diagnosis present

## 2024-02-09 DIAGNOSIS — J441 Chronic obstructive pulmonary disease with (acute) exacerbation: Secondary | ICD-10-CM | POA: Diagnosis present

## 2024-02-09 DIAGNOSIS — J9602 Acute respiratory failure with hypercapnia: Secondary | ICD-10-CM

## 2024-02-09 DIAGNOSIS — Z6841 Body Mass Index (BMI) 40.0 and over, adult: Secondary | ICD-10-CM | POA: Diagnosis not present

## 2024-02-09 DIAGNOSIS — R0603 Acute respiratory distress: Secondary | ICD-10-CM | POA: Diagnosis not present

## 2024-02-09 DIAGNOSIS — J9601 Acute respiratory failure with hypoxia: Secondary | ICD-10-CM

## 2024-02-09 DIAGNOSIS — Z7984 Long term (current) use of oral hypoglycemic drugs: Secondary | ICD-10-CM

## 2024-02-09 DIAGNOSIS — J449 Chronic obstructive pulmonary disease, unspecified: Secondary | ICD-10-CM | POA: Diagnosis present

## 2024-02-09 DIAGNOSIS — Z91199 Patient's noncompliance with other medical treatment and regimen due to unspecified reason: Secondary | ICD-10-CM

## 2024-02-09 DIAGNOSIS — Z1152 Encounter for screening for COVID-19: Secondary | ICD-10-CM | POA: Diagnosis not present

## 2024-02-09 DIAGNOSIS — Z91048 Other nonmedicinal substance allergy status: Secondary | ICD-10-CM | POA: Diagnosis not present

## 2024-02-09 DIAGNOSIS — Z9981 Dependence on supplemental oxygen: Secondary | ICD-10-CM

## 2024-02-09 HISTORY — DX: Dependence on supplemental oxygen: J96.11

## 2024-02-09 HISTORY — DX: Unspecified diastolic (congestive) heart failure: I50.30

## 2024-02-09 HISTORY — DX: Chronic obstructive pulmonary disease, unspecified: J44.9

## 2024-02-09 HISTORY — DX: Nicotine dependence, unspecified, uncomplicated: F17.200

## 2024-02-09 HISTORY — DX: Obstructive sleep apnea (adult) (pediatric): G47.33

## 2024-02-09 HISTORY — DX: Morbid (severe) obesity due to excess calories: E66.01

## 2024-02-09 LAB — CBC WITH DIFFERENTIAL/PLATELET
Abs Immature Granulocytes: 0.04 K/uL (ref 0.00–0.07)
Basophils Absolute: 0 K/uL (ref 0.0–0.1)
Basophils Relative: 1 %
Eosinophils Absolute: 0.1 K/uL (ref 0.0–0.5)
Eosinophils Relative: 2 %
HCT: 35 % — ABNORMAL LOW (ref 36.0–46.0)
Hemoglobin: 10.1 g/dL — ABNORMAL LOW (ref 12.0–15.0)
Immature Granulocytes: 1 %
Lymphocytes Relative: 21 %
Lymphs Abs: 1.4 K/uL (ref 0.7–4.0)
MCH: 26.9 pg (ref 26.0–34.0)
MCHC: 28.9 g/dL — ABNORMAL LOW (ref 30.0–36.0)
MCV: 93.1 fL (ref 80.0–100.0)
Monocytes Absolute: 0.4 K/uL (ref 0.1–1.0)
Monocytes Relative: 5 %
Neutro Abs: 4.7 K/uL (ref 1.7–7.7)
Neutrophils Relative %: 70 %
Platelets: 48 K/uL — ABNORMAL LOW (ref 150–400)
RBC: 3.76 MIL/uL — ABNORMAL LOW (ref 3.87–5.11)
RDW: 16 % — ABNORMAL HIGH (ref 11.5–15.5)
WBC: 6.7 K/uL (ref 4.0–10.5)
nRBC: 0.3 % — ABNORMAL HIGH (ref 0.0–0.2)

## 2024-02-09 LAB — RESPIRATORY PANEL BY PCR

## 2024-02-09 LAB — CBC
HCT: 35.3 % — ABNORMAL LOW (ref 36.0–46.0)
Hemoglobin: 10.2 g/dL — ABNORMAL LOW (ref 12.0–15.0)
MCH: 26.8 pg (ref 26.0–34.0)
MCHC: 28.9 g/dL — ABNORMAL LOW (ref 30.0–36.0)
MCV: 92.7 fL (ref 80.0–100.0)
Platelets: 203 K/uL (ref 150–400)
RBC: 3.81 MIL/uL — ABNORMAL LOW (ref 3.87–5.11)
RDW: 15.8 % — ABNORMAL HIGH (ref 11.5–15.5)
WBC: 9.3 K/uL (ref 4.0–10.5)
nRBC: 0.3 % — ABNORMAL HIGH (ref 0.0–0.2)

## 2024-02-09 LAB — I-STAT VENOUS BLOOD GAS, ED
Acid-Base Excess: 13 mmol/L — ABNORMAL HIGH (ref 0.0–2.0)
Acid-Base Excess: 9 mmol/L — ABNORMAL HIGH (ref 0.0–2.0)
Acid-Base Excess: 8 mmol/L — ABNORMAL HIGH (ref 0.0–2.0)
Acid-Base Excess: 11 mmol/L — ABNORMAL HIGH (ref 0.0–2.0)
Bicarbonate: 40.1 mmol/L — ABNORMAL HIGH (ref 20.0–28.0)
Bicarbonate: 40.3 mmol/L — ABNORMAL HIGH (ref 20.0–28.0)
Bicarbonate: 40.1 mmol/L — ABNORMAL HIGH (ref 20.0–28.0)
Bicarbonate: 40.2 mmol/L — ABNORMAL HIGH (ref 20.0–28.0)
Calcium, Ion: 1.03 mmol/L — ABNORMAL LOW (ref 1.15–1.40)
Calcium, Ion: 1.23 mmol/L (ref 1.15–1.40)
Calcium, Ion: 1.21 mmol/L (ref 1.15–1.40)
Calcium, Ion: 1.19 mmol/L (ref 1.15–1.40)
HCT: 35 % — ABNORMAL LOW (ref 36.0–46.0)
HCT: 36 % (ref 36.0–46.0)
HCT: 36 % (ref 36.0–46.0)
HCT: 36 % (ref 36.0–46.0)
Hemoglobin: 11.9 g/dL — ABNORMAL LOW (ref 12.0–15.0)
Hemoglobin: 12.2 g/dL (ref 12.0–15.0)
Hemoglobin: 12.2 g/dL (ref 12.0–15.0)
Hemoglobin: 12.2 g/dL (ref 12.0–15.0)
O2 Saturation: 61 %
O2 Saturation: 55 %
O2 Saturation: 37 %
O2 Saturation: 83 %
Patient temperature: 98
Patient temperature: 98
Patient temperature: 98
Potassium: 6.4 mmol/L (ref 3.5–5.1)
Potassium: 4.6 mmol/L (ref 3.5–5.1)
Potassium: 4.2 mmol/L (ref 3.5–5.1)
Potassium: 4.3 mmol/L (ref 3.5–5.1)
Sodium: 135 mmol/L (ref 135–145)
Sodium: 138 mmol/L (ref 135–145)
Sodium: 138 mmol/L (ref 135–145)
Sodium: 137 mmol/L (ref 135–145)
TCO2: 42 mmol/L — ABNORMAL HIGH (ref 22–32)
TCO2: 43 mmol/L — ABNORMAL HIGH (ref 22–32)
TCO2: 43 mmol/L — ABNORMAL HIGH (ref 22–32)
TCO2: 43 mmol/L — ABNORMAL HIGH (ref 22–32)
pCO2, Ven: 60.9 mmHg — ABNORMAL HIGH (ref 44–60)
pCO2, Ven: 94.2 mmHg (ref 44–60)
pCO2, Ven: 104.7 mmHg (ref 44–60)
pCO2, Ven: 81.3 mmHg (ref 44–60)
pH, Ven: 7.425 (ref 7.25–7.43)
pH, Ven: 7.237 — ABNORMAL LOW (ref 7.25–7.43)
pH, Ven: 7.19 — CL (ref 7.25–7.43)
pH, Ven: 7.302 (ref 7.25–7.43)
pO2, Ven: 31 mmHg — CL (ref 32–45)
pO2, Ven: 36 mmHg (ref 32–45)
pO2, Ven: 28 mmHg — CL (ref 32–45)
pO2, Ven: 55 mmHg — ABNORMAL HIGH (ref 32–45)

## 2024-02-09 LAB — COMPREHENSIVE METABOLIC PANEL WITH GFR
ALT: 7 U/L (ref 0–44)
AST: 14 U/L — ABNORMAL LOW (ref 15–41)
Albumin: 3.8 g/dL (ref 3.5–5.0)
Alkaline Phosphatase: 70 U/L (ref 38–126)
Anion gap: 7 (ref 5–15)
BUN: 9 mg/dL (ref 6–20)
CO2: 35 mmol/L — ABNORMAL HIGH (ref 22–32)
Calcium: 9.2 mg/dL (ref 8.9–10.3)
Chloride: 97 mmol/L — ABNORMAL LOW (ref 98–111)
Creatinine, Ser: 0.6 mg/dL (ref 0.44–1.00)
GFR, Estimated: >60 mL/min (ref >60)
Glucose, Bld: 122 mg/dL — ABNORMAL HIGH (ref 70–99)
Potassium: 4.4 mmol/L (ref 3.5–5.1)
Sodium: 139 mmol/L (ref 135–145)
Total Bilirubin: 0.4 mg/dL (ref 0.0–1.2)
Total Protein: 8.3 g/dL — ABNORMAL HIGH (ref 6.5–8.1)

## 2024-02-09 LAB — PRO BRAIN NATRIURETIC PEPTIDE: Pro Brain Natriuretic Peptide: 583 pg/mL — ABNORMAL HIGH (ref <300.0)

## 2024-02-09 LAB — RESP PANEL BY RT-PCR (RSV, FLU A&B, COVID)  RVPGX2
Influenza A by PCR: NEGATIVE
Influenza B by PCR: NEGATIVE
Resp Syncytial Virus by PCR: NEGATIVE
SARS Coronavirus 2 by RT PCR: NEGATIVE

## 2024-02-09 LAB — GLUCOSE, CAPILLARY
Glucose-Capillary: 124 mg/dL — ABNORMAL HIGH (ref 70–99)
Glucose-Capillary: 183 mg/dL — ABNORMAL HIGH (ref 70–99)
Glucose-Capillary: 202 mg/dL — ABNORMAL HIGH (ref 70–99)

## 2024-02-09 LAB — EXPECTORATED SPUTUM ASSESSMENT W GRAM STAIN, RFLX TO RESP C

## 2024-02-09 LAB — MRSA NEXT GEN BY PCR, NASAL: MRSA by PCR Next Gen: NOT DETECTED

## 2024-02-09 LAB — HEMOGLOBIN A1C
Hgb A1c MFr Bld: 5.5 % (ref 4.8–5.6)
Mean Plasma Glucose: 111.15 mg/dL

## 2024-02-09 LAB — CREATININE, SERUM
Creatinine, Ser: 0.74 mg/dL (ref 0.44–1.00)
GFR, Estimated: >60 mL/min (ref >60)

## 2024-02-09 LAB — HCG, QUANTITATIVE, PREGNANCY: hCG, Beta Chain, Quant, S: <1 m[IU]/mL (ref <5)

## 2024-02-09 MED ORDER — POLYETHYLENE GLYCOL 3350 17 G PO PACK: 17.0000 g | PACK | Freq: Every day | ORAL | Status: DC | PRN

## 2024-02-09 MED ORDER — POTASSIUM CHLORIDE CRYS ER 20 MEQ PO TBCR
20.0000 meq | EXTENDED_RELEASE_TABLET | Freq: Once | ORAL | Status: AC
  Administered 2024-02-09: 20 meq via ORAL
  Filled 2024-02-09: qty 1

## 2024-02-09 MED ORDER — METHYLPREDNISOLONE SODIUM SUCC 125 MG IJ SOLR: 125.0000 mg | Freq: Once | INTRAMUSCULAR | Status: DC

## 2024-02-09 MED ORDER — PREDNISONE 20 MG PO TABS: 40.0000 mg | ORAL_TABLET | Freq: Every day | ORAL | Status: DC

## 2024-02-09 MED ORDER — REVEFENACIN 175 MCG/3ML IN SOLN
175.0000 ug | Freq: Every day | RESPIRATORY_TRACT | Status: DC
  Administered 2024-02-10 – 2024-02-12 (×3): 175 ug via RESPIRATORY_TRACT
  Filled 2024-02-09 (×3): qty 3

## 2024-02-09 MED ORDER — SODIUM CHLORIDE 0.9 % IV SOLN
2.0000 g | Freq: Once | INTRAVENOUS | Status: AC
  Administered 2024-02-09: 2 g via INTRAVENOUS
  Filled 2024-02-09: qty 20

## 2024-02-09 MED ORDER — ALBUTEROL (5 MG/ML) CONTINUOUS INHALATION SOLN
10.0000 mg/h | INHALATION_SOLUTION | Freq: Once | RESPIRATORY_TRACT | Status: DC
  Filled 2024-02-09: qty 20

## 2024-02-09 MED ORDER — ALBUTEROL SULFATE (2.5 MG/3ML) 0.083% IN NEBU
10.0000 mg | INHALATION_SOLUTION | RESPIRATORY_TRACT | Status: AC
  Administered 2024-02-09 (×2): 10 mg via RESPIRATORY_TRACT
  Filled 2024-02-09 (×2): qty 12

## 2024-02-09 MED ORDER — BUDESONIDE 0.25 MG/2ML IN SUSP
0.2500 mg | Freq: Two times a day (BID) | RESPIRATORY_TRACT | Status: DC
  Administered 2024-02-09 – 2024-02-12 (×6): 0.25 mg via RESPIRATORY_TRACT
  Filled 2024-02-09 (×6): qty 2

## 2024-02-09 MED ORDER — SODIUM CHLORIDE 0.9 % IV SOLN
100.0000 mg | Freq: Two times a day (BID) | INTRAVENOUS | Status: DC
  Filled 2024-02-09: qty 100

## 2024-02-09 MED ORDER — FUROSEMIDE 10 MG/ML IJ SOLN
40.0000 mg | Freq: Once | INTRAMUSCULAR | Status: AC
  Administered 2024-02-09: 40 mg via INTRAVENOUS
  Filled 2024-02-09: qty 4

## 2024-02-09 MED ORDER — HEPARIN SODIUM (PORCINE) 5000 UNIT/ML IJ SOLN
5000.0000 [IU] | Freq: Three times a day (TID) | INTRAMUSCULAR | Status: DC
  Filled 2024-02-09: qty 1

## 2024-02-09 MED ORDER — DEXMEDETOMIDINE HCL IN NACL 400 MCG/100ML IV SOLN
0.0000 ug/kg/h | INTRAVENOUS | Status: DC
Start: 2024-02-09 — End: 2024-02-10
  Administered 2024-02-09 – 2024-02-10 (×3): 0.4 ug/kg/h via INTRAVENOUS
  Filled 2024-02-09 (×2): qty 100

## 2024-02-09 MED ORDER — NICOTINE POLACRILEX 2 MG MT GUM
2.0000 mg | CHEWING_GUM | OROMUCOSAL | Status: DC | PRN
Start: 2024-02-09 — End: 2024-02-12

## 2024-02-09 MED ORDER — ARFORMOTEROL TARTRATE 15 MCG/2ML IN NEBU
15.0000 ug | INHALATION_SOLUTION | Freq: Two times a day (BID) | RESPIRATORY_TRACT | Status: DC
  Administered 2024-02-09 – 2024-02-12 (×5): 15 ug via RESPIRATORY_TRACT
  Filled 2024-02-09 (×5): qty 2

## 2024-02-09 MED ORDER — IPRATROPIUM-ALBUTEROL 0.5-2.5 (3) MG/3ML IN SOLN
3.0000 mL | RESPIRATORY_TRACT | Status: DC | PRN
  Administered 2024-02-09 – 2024-02-10 (×4): 3 mL via RESPIRATORY_TRACT
  Filled 2024-02-09 (×4): qty 3

## 2024-02-09 MED ORDER — FUROSEMIDE 10 MG/ML IJ SOLN
60.0000 mg | Freq: Once | INTRAMUSCULAR | Status: AC
  Administered 2024-02-09: 60 mg via INTRAVENOUS
  Filled 2024-02-09: qty 6

## 2024-02-09 MED ORDER — MIDAZOLAM HCL (PF) 2 MG/2ML IJ SOLN
2.0000 mg | Freq: Once | INTRAMUSCULAR | Status: AC
  Administered 2024-02-09: 2 mg via INTRAVENOUS
  Filled 2024-02-09: qty 2

## 2024-02-09 MED ORDER — ORAL CARE MOUTH RINSE
15.0000 mL | OROMUCOSAL | Status: DC
  Administered 2024-02-09 – 2024-02-10 (×4): 15 mL via OROMUCOSAL

## 2024-02-09 MED ORDER — PREDNISONE 20 MG PO TABS
40.0000 mg | ORAL_TABLET | Freq: Every day | ORAL | Status: DC
Start: 2024-02-10 — End: 2024-02-14
  Administered 2024-02-10 – 2024-02-12 (×3): 40 mg via ORAL
  Filled 2024-02-09 (×3): qty 2

## 2024-02-09 MED ORDER — SODIUM CHLORIDE 0.9 % IV SOLN
500.0000 mg | Freq: Once | INTRAVENOUS | Status: DC
  Filled 2024-02-09: qty 5

## 2024-02-09 MED ORDER — REVEFENACIN 175 MCG/3ML IN SOLN: 175.0000 ug | Freq: Every day | RESPIRATORY_TRACT | Status: DC

## 2024-02-09 MED ORDER — INSULIN ASPART 100 UNIT/ML IJ SOLN
0.0000 [IU] | INTRAMUSCULAR | Status: DC
  Administered 2024-02-09: 7 [IU] via SUBCUTANEOUS
  Administered 2024-02-09 – 2024-02-10 (×2): 4 [IU] via SUBCUTANEOUS
  Administered 2024-02-10 (×3): 3 [IU] via SUBCUTANEOUS
  Administered 2024-02-11 (×2): 4 [IU] via SUBCUTANEOUS
  Administered 2024-02-11 – 2024-02-12 (×3): 3 [IU] via SUBCUTANEOUS
  Filled 2024-02-09: qty 3
  Filled 2024-02-09: qty 4
  Filled 2024-02-09: qty 3
  Filled 2024-02-09 (×3): qty 4
  Filled 2024-02-09: qty 3
  Filled 2024-02-09: qty 4
  Filled 2024-02-09 (×4): qty 3
  Filled 2024-02-09: qty 7
  Filled 2024-02-09: qty 5

## 2024-02-09 MED ORDER — SODIUM CHLORIDE 0.9 % IV SOLN
2.0000 g | INTRAVENOUS | Status: DC
  Filled 2024-02-09: qty 20

## 2024-02-09 MED ORDER — DOCUSATE SODIUM 100 MG PO CAPS: 100.0000 mg | ORAL_CAPSULE | Freq: Two times a day (BID) | ORAL | Status: DC | PRN

## 2024-02-09 MED ORDER — CHLORHEXIDINE GLUCONATE CLOTH 2 % EX PADS
6.0000 | MEDICATED_PAD | Freq: Every day | CUTANEOUS | Status: DC
  Administered 2024-02-09 – 2024-02-11 (×4): 6 via TOPICAL

## 2024-02-09 MED ORDER — ORAL CARE MOUTH RINSE: 15.0000 mL | OROMUCOSAL | Status: DC | PRN

## 2024-02-09 NOTE — ED Triage Notes (Signed)
 BIB EMS Medic 6 for respiratory distress. Been sick with URI symptoms the past week. Hx of CHF, wears 3-4L O2 at baseline. With EMS SpO2 76% on 5L.   Given 125 solumedrol, 2 duonebs with ems. SpO2 76% on RA upon arrival, improvement to 90s on 6L.

## 2024-02-09 NOTE — ED Notes (Signed)
 Panic VBG results to Dr Jakie.

## 2024-02-09 NOTE — ED Notes (Signed)
   02/09/24 1250  BiPAP/CPAP/SIPAP  BiPAP/CPAP/SIPAP Pt Type Adult  BiPAP/CPAP/SIPAP SERVO  Mask Type Nasal mask  Mask Size Large  Set Rate 20 breaths/min  Respiratory Rate 2 breaths/min  PEEP 5 cmH20  FiO2 (%) 40 %  Minute Ventilation 14  Peak Inspiratory Pressure (PIP) 25  Tidal Volume (Vt) 710  BiPAP/CPAP /SiPAP Vitals  SpO2 90 %   Moved to rm 14 placed on NIV/PC Peep 5/PC 20, set RR 20, FiO2 .40

## 2024-02-09 NOTE — ED Notes (Signed)
 Patient took NIV off I can't take this  MD aware. At bedside placed on 6lpm [place].  SpO2 91%

## 2024-02-09 NOTE — ED Notes (Signed)
 Pt comfortable on bipap at this time. Precedex to remain at current does at 0.4mcg per order.

## 2024-02-09 NOTE — ED Notes (Signed)
 Called [facility] for consult to intensivist, spoke with Zachary.

## 2024-02-09 NOTE — Plan of Care (Signed)
 Arrived from [facility] on Bipap but was able to decrease to HFNC 11 LPM with sats improved. Verbalized anxiety about missing work.

## 2024-02-09 NOTE — ED Notes (Addendum)
 Blood cultures from R FA sent prior to rocephin starting according to EMR. Unable to obtain 2nd set of blood cultures due to poor IV access sites. MD notified. No new orders at this time

## 2024-02-09 NOTE — Progress Notes (Signed)
   02/09/24 2330  BiPAP/CPAP/SIPAP  BiPAP/CPAP/SIPAP Pt Type Adult  BiPAP/CPAP/SIPAP SERVO  Mask Type Full face mask  Dentures removed? Not applicable  Mask Size Large  IPAP 10 cmH20  EPAP 5 cmH2O  PEEP 5 cmH20  FiO2 (%) 40 %  Minute Ventilation 8.3  Leak 6  Peak Inspiratory Pressure (PIP) 14  Press High Alarm 25 cmH2O

## 2024-02-09 NOTE — ED Notes (Signed)
 Only able to get 1 set of blood cultures. Pt is a difficult  stick.

## 2024-02-09 NOTE — H&P (Signed)
 NAMERosmarie Esquibel, MRN:  [id number], DOB:  03/02/1986, LOS: 0 ADMISSION DATE:  02/09/2024, CONSULTATION DATE:  11/17 REFERRING MD:  Dr. Yolande, CHIEF COMPLAINT:  SOB   History of Present Illness:  38 year old female with past medical history as below, which is significant for COPD, CHF, morbid obesity, diabetes mellitus, and chronic respiratory failure on 4 L home oxygen.  She presented to [facility] emergency department complaining of upper respiratory infection type symptoms lasting over a period of days.  Symptoms included cough productive of green sputum, shortness of breath, and lower extremity edema.  Upon arrival to the emergency department her oxygen saturation was 76% on 5 L and she was also found to have hypercarbic respiratory failure on venous blood gas.  BiPAP therapy was initiated, however, the patient had difficulty tolerating BiPAP leading to Precedex and benzodiazepine administration.  There was significant concern the patient may have progressed to needing intubation, therefore pulmonary critical care medicine was asked to evaluate for admission.  Pertinent  Medical History   has a past medical history of Asthma, CHF (congestive heart failure) (HCC), Morbid obesity (HCC), and Type 2 diabetes mellitus (HCC) (01/02/2022).   Significant Hospital Events: Including procedures, antibiotic start and stop dates in addition to other pertinent events     Interim History / Subjective:    Objective    Blood pressure 113/73, pulse 79, temperature 98.6 F (37 C), temperature source Oral, resp. rate 17, height 5' 4 (1.626 m), weight (!) 239 kg, SpO2 92%.    FiO2 (%):  [40 %-50 %] 50 % PEEP:  [5 cmH20] 5 cmH20   Intake/Output Summary (Last 24 hours) at 02/09/2024 1503 Last data filed at 02/09/2024 1304 Gross per 24 hour  Intake --  Output 700 ml  Net -700 ml   Filed Weights   02/09/24 1036  Weight: (!) 239 kg    Examination: General: Morbidly obese middle  aged female in NAD HENT: [place]/AT, PERRL, no JVD Lungs: Decreased throughout Cardiovascular: RRR, no MRG Abdomen: Soft, NT, ND Extremities: Difficult to determine whether edema is present. No acute deformity.  Neuro: Alert, oriented, non-focal GU: Purewick, orange tinged urine.   Cr 0.60, BUN 9, K 4.4, BNP 583 WBC 6.7, Hgb 10.1, Plt 48  Resolved problem list   Assessment and Plan   Acute on chronic respiratory failure with hypoxia and hypercarbia  COPD with possible exacerbation Acute on chronic HFpEF - think is likely the chief driver of her symptoms at this point.  - BiPAP as tolerated during the day.  - Would keep BiPAP mandatory at night tonight - Has been needing Precedex to tolerate BiPAP, continue for now once we can get an IV. She has pulled out her IV's - Triple neb therapy - 5 day course of prednisone .  - Ongoing diuresis - Check RVP - She is s/p one week of doxy without improvement. Defer antibiotics for now. - Azithromycin  one dose given in ED - Repeat echocardiogram - Will need outpatient sleep study - Please arrange outpatient referral to pulm clinic when discharging.  - She tells me she is on farxiga at home but not seen on her med list. Awaiting formal med rec.   Anemia Thrombocytopenia (48 at time of admission, was 251 one month prior) Urine is blood tinged due to menses - Trend - holding VTE chemoppx, use SCDs  DM 2 - CBG monitoring and SSI   Labs   CBC: Recent Labs  Lab 02/09/24 0859 02/09/24 0914  02/09/24 1116 02/09/24 1213 02/09/24 1330  WBC 6.7  --   --   --   --   NEUTROABS 4.7  --   --   --   --   HGB 10.1* 11.9* 12.2 12.2 12.2  HCT 35.0* 35.0* 36.0 36.0 36.0  MCV 93.1  --   --   --   --   PLT 48*  --   --   --   --     Basic Metabolic Panel: Recent Labs  Lab 02/09/24 0914 02/09/24 0945 02/09/24 1116 02/09/24 1213 02/09/24 1330  NA 135 139 138 138 137  K 6.4* 4.4 4.6 4.2 4.3  CL  --  97*  --   --   --   CO2  --  35*  --   --    --   GLUCOSE  --  122*  --   --   --   BUN  --  9  --   --   --   CREATININE  --  0.60  --   --   --   CALCIUM  --  9.2  --   --   --    GFR: Estimated Creatinine Clearance: 189.5 mL/min (by C-G formula based on SCr of 0.6 mg/dL). Recent Labs  Lab 02/09/24 0859  WBC 6.7    Liver Function Tests: Recent Labs  Lab 02/09/24 0945  AST 14*  ALT 7  ALKPHOS 70  BILITOT 0.4  PROT 8.3*  ALBUMIN 3.8   No results for input(s): LIPASE, AMYLASE in the last 168 hours. No results for input(s): AMMONIA in the last 168 hours.  ABG    Component Value Date/Time   HCO3 40.2 (H) 02/09/2024 1330   TCO2 43 (H) 02/09/2024 1330   O2SAT 83 02/09/2024 1330     Coagulation Profile: No results for input(s): INR, PROTIME in the last 168 hours.  Cardiac Enzymes: No results for input(s): CKTOTAL, CKMB, CKMBINDEX, TROPONINI in the last 168 hours.  HbA1C: Hgb A1c MFr Bld  Date/Time Value Ref Range Status  11/03/2021 12:58 AM 6.2 (H) 4.8 - 5.6 % Final    Comment:    (NOTE) Pre diabetes:          5.7%-6.4%  Diabetes:              >6.4%  Glycemic control for   <7.0% adults with diabetes     CBG: No results for input(s): GLUCAP in the last 168 hours.  Review of Systems:   Bolds are positive  Constitutional: weight loss, gain, night sweats, Fevers, chills, fatigue .  HEENT: headaches, Sore throat, sneezing, nasal congestion, post nasal drip, Difficulty swallowing, Tooth/dental problems, visual complaints visual changes, ear ache CV:  chest pain, radiates:,Orthopnea, PND, swelling in lower extremities, dizziness, palpitations, syncope.  GI  heartburn, indigestion, abdominal pain, nausea, vomiting, diarrhea, change in bowel habits, loss of appetite, bloody stools.  Resp: cough, productive: , hemoptysis, dyspnea, chest pain, pleuritic.  Skin: rash or itching or icterus GU: dysuria, change in color of urine, urgency or frequency. flank pain, hematuria  MS: joint pain or  swelling. decreased range of motion  Psych: change in mood or affect. depression or anxiety.  Neuro: difficulty with speech, weakness, numbness, ataxia    Past Medical History:  She,  has a past medical history of Asthma, CHF (congestive heart failure) (HCC), Morbid obesity (HCC), and Type 2 diabetes mellitus (HCC) (01/02/2022).   Surgical History:   Past Surgical  History:  Procedure Laterality Date   CESAREAN SECTION     CESAREAN SECTION     CHOLECYSTECTOMY     INTRAUTERINE DEVICE INSERTION     IUD REMOVAL     TUBAL LIGATION       Social History:   reports that she has been smoking cigarettes. She has a 9 pack-year smoking history. She has never used smokeless tobacco. She reports current alcohol use of about 8.0 standard drinks of alcohol per week. She reports that she does not use drugs.   Family History:  Her family history is not on file.   Allergies Allergies  Allergen Reactions   Tape Other (See Comments)    Skin irritation     Home Medications  Prior to Admission medications   Medication Sig Start Date End Date Taking? Authorizing Provider  furosemide  (LASIX ) 40 MG tablet Take 1 tablet (40 mg total) by mouth daily. Take extra dose in afternoon if worsening swelling in legs/arms or 3-5 lb weight gain in 24 hours. 01/04/22  Yes Patsy Lenis, MD  albuterol  (PROVENTIL  HFA;VENTOLIN  HFA) 108 (90 Base) MCG/ACT inhaler Inhale 2 puffs into the lungs every 4 (four) hours as needed for wheezing or shortness of breath. Patient not taking: Reported on 01/02/2022 09/06/16   Haze Lonni PARAS, MD  budesonide  (PULMICORT ) 0.25 MG/2ML nebulizer solution Take 2 mLs (0.25 mg total) by nebulization 2 (two) times daily. Patient not taking: Reported on 01/02/2022 11/06/21   Vann, Jessica U, DO  ferrous sulfate  325 (65 FE) MG tablet Take 1 tablet (325 mg total) by mouth daily with breakfast. 01/04/22   Patsy Lenis, MD  metFORMIN  (GLUCOPHAGE ) 500 MG tablet Take 1 tablet (500 mg  total) by mouth daily with breakfast. Patient not taking: Reported on 01/02/2022 11/09/21 11/09/22  Vann, Jessica U, DO     Critical care time: 39 minutes     Deward Eastern, AGACNP-BC [facility]  See Amion for personal pager PCCM on call pager [phone] until 7pm. Please call Elink 7p-7a. [phone]  02/09/2024 4:20 PM

## 2024-02-09 NOTE — Progress Notes (Signed)
   PCCM transfer request    Sending physician: Yolande- EDP  Sending facility: [facility]  Reason for transfer: respiratory failure  Brief case summary: 38 y/o F with obesity, COPD, chronic resp failure on 4L O2, heart failure in the ED with several days of URI symptoms. On bipap, may end up needing intubation wheezing Initial PCO2 60, not tolerating bipap well-- has had precedex and versed. 7.19/104/ / 40- Still mentating. Got solumedrol with EMS  Recommendations made prior to transfer: intubate if concerned for ability to tolerate bipap for the trip. Doxycycline  and ceftriaxone.  Transfer accepted: yes- [facility] 94M    Renee Paul 02/09/24 12:51 PM [facility]  For contact information, see Amion. If no response to pager, please call PCCM consult pager. After hours, 7PM- 7AM, please call Elink.

## 2024-02-09 NOTE — ED Provider Notes (Signed)
 [place] EMERGENCY DEPARTMENT AT [facility] Provider Note   CSN: [id number] Arrival date & time: 02/09/24  9158     Patient presents with: Respiratory Distress   Renee Paul is a 38 y.o. female.  {Add pertinent medical, surgical, social history, OB history to HPI:4724} 38 year old female history of HFpEF, COPD on 4 L oxygen, and OSA noncompliant with her CPAP who presents to the emergency department with shortness of breath.  History obtained per patient EMS.  She has been sick for several days.  Reports daughter sick with similar symptoms.  She has had shortness of breath, cough productive of green sputum, and mild leg swelling.  Says she has been compliant with her Lasix .  When EMS arrived she was in respiratory distress.  Was satting 76% on 5 L.        Prior to Admission medications   Medication Sig Start Date End Date Taking? Authorizing Provider  albuterol  (PROVENTIL  HFA;VENTOLIN  HFA) 108 (90 Base) MCG/ACT inhaler Inhale 2 puffs into the lungs every 4 (four) hours as needed for wheezing or shortness of breath. Patient not taking: Reported on 01/02/2022 09/06/16   Haze Lonni PARAS, MD  budesonide  (PULMICORT ) 0.25 MG/2ML nebulizer solution Take 2 mLs (0.25 mg total) by nebulization 2 (two) times daily. Patient not taking: Reported on 01/02/2022 11/06/21   Vann, Jessica U, DO  ferrous sulfate  325 (65 FE) MG tablet Take 1 tablet (325 mg total) by mouth daily with breakfast. 01/04/22   Patsy Lenis, MD  furosemide  (LASIX ) 40 MG tablet Take 1 tablet (40 mg total) by mouth daily. Take extra dose in afternoon if worsening swelling in legs/arms or 3-5 lb weight gain in 24 hours. 01/04/22   Patsy Lenis, MD  metFORMIN  (GLUCOPHAGE ) 500 MG tablet Take 1 tablet (500 mg total) by mouth daily with breakfast. Patient not taking: Reported on 01/02/2022 11/09/21 11/09/22  Vann, Jessica U, DO    Allergies: Patient has no known allergies.    Review of Systems  Updated  Vital Signs BP 123/89   Pulse 98   Temp 98 F (36.7 C) (Oral)   Resp (!) 22   SpO2 (!) 76%   Physical Exam Vitals and nursing note reviewed.  Constitutional:      General: She is in acute distress.     Appearance: She is well-developed. She is ill-appearing.     Comments: Morbid obese.  Limits volume status evaluation  HENT:     Head: Normocephalic and atraumatic.     Right Ear: External ear normal.     Left Ear: External ear normal.     Nose: Nose normal.  Eyes:     Extraocular Movements: Extraocular movements intact.     Conjunctiva/sclera: Conjunctivae normal.     Pupils: Pupils are equal, round, and reactive to light.  Cardiovascular:     Rate and Rhythm: Normal rate and regular rhythm.     Heart sounds: No murmur heard. Pulmonary:     Effort: Respiratory distress present.     Breath sounds: Wheezing (Expiratory and inspiratory) present.  Musculoskeletal:     Cervical back: Normal range of motion and neck supple.     Right lower leg: Edema present.     Left lower leg: Edema present.  Skin:    General: Skin is warm and dry.  Neurological:     Mental Status: She is alert and oriented to person, place, and time. Mental status is at baseline.  Psychiatric:  Mood and Affect: Mood normal.     (all labs ordered are listed, but only abnormal results are displayed) Labs Reviewed - No data to display  EKG: None  Radiology: No results found.  {Document cardiac monitor, telemetry assessment procedure when appropriate:32947} Procedures   Medications Ordered in the ED - No data to display  Clinical Course as of 02/10/24 9378  [facility] Feb 09, 2024  1251 Dr Gretta  [RP]    Clinical Course User Index [RP] Yolande Lamar BROCKS, MD   {Click here for ABCD2, HEART and other calculators REFRESH Note before signing:1}                              Medical Decision Making Amount and/or Complexity of Data Reviewed Labs: ordered. Radiology: ordered.  Risk Prescription  drug management. Decision regarding hospitalization.   ***  {Document critical care time when appropriate  Document review of labs and clinical decision tools ie CHADS2VASC2, etc  Document your independent review of radiology images and any outside records  Document your discussion with family members, caretakers and with consultants  Document social determinants of health affecting pt's care  Document your decision making why or why not admission, treatments were needed:32947:::1}   Final diagnoses:  None    ED Discharge Orders     None

## 2024-02-09 NOTE — ED Notes (Signed)
 VBG results to Dr Jakie.

## 2024-02-09 NOTE — Progress Notes (Signed)
   02/09/24 1529  Therapy Vitals  Pulse Rate 79  MEWS Score/Color  MEWS Score 0  MEWS Score Color Green  Oxygen Therapy/Pulse Ox  O2 Device (S)  HFNC (Pt was on BiPAP upon carelink arrival. when trying to move pt to ICU bed she pulled BiPAP mask off.)  SpO2 95 %  O2 Flow Rate (L/min) 11 L/min

## 2024-02-10 ENCOUNTER — Inpatient Hospital Stay (HOSPITAL_COMMUNITY)

## 2024-02-10 ENCOUNTER — Encounter (HOSPITAL_COMMUNITY): Payer: Self-pay | Admitting: Critical Care Medicine

## 2024-02-10 DIAGNOSIS — Z6841 Body Mass Index (BMI) 40.0 and over, adult: Secondary | ICD-10-CM

## 2024-02-10 DIAGNOSIS — J441 Chronic obstructive pulmonary disease with (acute) exacerbation: Principal | ICD-10-CM

## 2024-02-10 DIAGNOSIS — R0603 Acute respiratory distress: Secondary | ICD-10-CM | POA: Diagnosis not present

## 2024-02-10 DIAGNOSIS — D649 Anemia, unspecified: Secondary | ICD-10-CM

## 2024-02-10 LAB — GLUCOSE, CAPILLARY
Glucose-Capillary: 110 mg/dL — ABNORMAL HIGH (ref 70–99)
Glucose-Capillary: 111 mg/dL — ABNORMAL HIGH (ref 70–99)
Glucose-Capillary: 128 mg/dL — ABNORMAL HIGH (ref 70–99)
Glucose-Capillary: 142 mg/dL — ABNORMAL HIGH (ref 70–99)
Glucose-Capillary: 142 mg/dL — ABNORMAL HIGH (ref 70–99)
Glucose-Capillary: 182 mg/dL — ABNORMAL HIGH (ref 70–99)

## 2024-02-10 LAB — BASIC METABOLIC PANEL WITH GFR
Anion gap: 11 (ref 5–15)
BUN: 10 mg/dL (ref 6–20)
CO2: 33 mmol/L — ABNORMAL HIGH (ref 22–32)
Calcium: 8.7 mg/dL — ABNORMAL LOW (ref 8.9–10.3)
Chloride: 93 mmol/L — ABNORMAL LOW (ref 98–111)
Creatinine, Ser: 0.65 mg/dL (ref 0.44–1.00)
GFR, Estimated: >60 mL/min (ref >60)
Glucose, Bld: 114 mg/dL — ABNORMAL HIGH (ref 70–99)
Potassium: 4.8 mmol/L (ref 3.5–5.1)
Sodium: 137 mmol/L (ref 135–145)

## 2024-02-10 LAB — CBC
HCT: 34.7 % — ABNORMAL LOW (ref 36.0–46.0)
Hemoglobin: 10 g/dL — ABNORMAL LOW (ref 12.0–15.0)
MCH: 26.7 pg (ref 26.0–34.0)
MCHC: 28.8 g/dL — ABNORMAL LOW (ref 30.0–36.0)
MCV: 92.8 fL (ref 80.0–100.0)
Platelets: 256 K/uL (ref 150–400)
RBC: 3.74 MIL/uL — ABNORMAL LOW (ref 3.87–5.11)
RDW: 15.7 % — ABNORMAL HIGH (ref 11.5–15.5)
WBC: 10.5 K/uL (ref 4.0–10.5)
nRBC: 0.4 % — ABNORMAL HIGH (ref 0.0–0.2)

## 2024-02-10 LAB — ECHOCARDIOGRAM COMPLETE
AR max vel: 3.03 cm2
AV Peak grad: 7.3 mmHg
Ao pk vel: 1.35 m/s
Area-P 1/2: 3.72 cm2
Height: 64 in
S' Lateral: 3 cm
Weight: 8320 [oz_av]

## 2024-02-10 MED ORDER — ENOXAPARIN SODIUM 120 MG/0.8ML IJ SOSY
120.0000 mg | PREFILLED_SYRINGE | Freq: Every day | INTRAMUSCULAR | Status: DC
  Administered 2024-02-10 – 2024-02-12 (×3): 120 mg via SUBCUTANEOUS
  Filled 2024-02-10 (×3): qty 0.8

## 2024-02-10 MED ORDER — FUROSEMIDE 10 MG/ML IJ SOLN
60.0000 mg | Freq: Once | INTRAMUSCULAR | Status: AC
  Administered 2024-02-10: 60 mg via INTRAVENOUS
  Filled 2024-02-10: qty 6

## 2024-02-10 MED ORDER — POTASSIUM CHLORIDE CRYS ER 20 MEQ PO TBCR
20.0000 meq | EXTENDED_RELEASE_TABLET | Freq: Once | ORAL | Status: AC
  Administered 2024-02-10: 20 meq via ORAL
  Filled 2024-02-10: qty 1

## 2024-02-10 MED ORDER — FUROSEMIDE 10 MG/ML IJ SOLN
60.0000 mg | Freq: Three times a day (TID) | INTRAMUSCULAR | Status: AC
  Administered 2024-02-10 (×2): 60 mg via INTRAVENOUS
  Filled 2024-02-10 (×2): qty 6

## 2024-02-10 NOTE — TOC CM/SW Note (Signed)
 Transition of Care [facility]) - Inpatient Brief Assessment   Patient Details  Name: [name] MRN: [id number] Date of Birth: [birth year]  [facility] CM/SW Contact:    Lauraine FORBES Saa, LCSWA Phone Number: 02/10/2024, 10:16 AM   Clinical Narrative:  10:16 AM Per chart review, patient has a PCP and insurance. Patient does not have SNF or HH history. Patient has DME (oxygen, tube bench, nebulizer machine, CPAP) history [employer] and [employer]. Patient's preferred pharmacy's are [facility] [address] [facility] [address]. No TOC needs identified at this time. TOC will continue to follow.  Transition of Care Asessment: Insurance and Status: Insurance coverage has been reviewed Patient has primary care physician: Yes Home environment has been reviewed: Private Residence Prior level of function:: N/A Prior/Current Home Services: No current home services Social Drivers of Health Review: SDOH reviewed no interventions necessary Readmission risk has been reviewed: Yes (Currently Green 14%) Transition of care needs: no transition of care needs at this time

## 2024-02-10 NOTE — Progress Notes (Signed)
 eLink Physician-Brief Progress Note Patient Name: Tonantzin Mimnaugh DOB: 09-Jul-1985 MRN: [id number]   Date of Service  02/10/2024  HPI/Events of Note  Intermittent cough  eICU Interventions  PRN Tessalon  Perles   0300 -requesting sleep aid, add melatonin, too late in the night for other meds  Intervention Category Minor Interventions: Routine modifications to care plan (e.g. PRN medications for pain, fever)  Eloisa Chokshi 02/10/2024, 9:27 PM

## 2024-02-10 NOTE — Evaluation (Signed)
 Physical Therapy Evaluation Patient Details Name: Renee Paul MRN: [id number] DOB: 1985-11-28 Today's Date: 02/10/2024  History of Present Illness  Patient is a 38 y/o female admitted 02/09/24 with hypoxia to 76% on 5L O2 and potential increased LE edema, UTI symptoms and green sputum.  PMH positive for obesity, tobacco abuse, OSA, COPD, CHF, DM and chronic respiratory failure on 4L home O2.  Clinical Impression  Patient presents with decreased mobility due  decreased activity tolerance with hypoxia walking on 8L O2 to 83%.  She typically manages ok at home though lives in second floor apartment with spouse and children.  She works from home and can complete many IADL's.  Currently with ambulation up to 100' on 8L O2 dropping SpO2 though recovers quickly with seated rest.  She is eager to mobilize and will continue to benefit from skilled PT in the acute setting prior to d/c home with family support and likely no post-acute PT needed at d/c.       If plan is discharge home, recommend the following: A little help with walking and/or transfers;Help with stairs or ramp for entrance   Can travel by private vehicle        Equipment Recommendations None recommended by PT  Recommendations for Other Services       Functional Status Assessment Patient has had a recent decline in their functional status and demonstrates the ability to make significant improvements in function in a reasonable and predictable amount of time.     Precautions / Restrictions Precautions Precautions: Fall Precaution/Restrictions Comments: watch O2      Mobility  Bed Mobility Overal bed mobility: Modified Independent             General bed mobility comments: HOB up    Transfers Overall transfer level: Needs assistance Equipment used: None Transfers: Sit to/from Stand Sit to Stand: Supervision           General transfer comment: assist for lines/safety    Ambulation/Gait Ambulation/Gait  assistance: Supervision, Contact guard assist Gait Distance (Feet): 100 Feet Assistive device: None Gait Pattern/deviations: Step-through pattern, Decreased stride length, Wide base of support       General Gait Details: increased lateral weight shift with wide BOS due to limb girth  Stairs            Wheelchair Mobility     Tilt Bed    Modified Rankin (Stroke Patients Only)       Balance Overall balance assessment: Needs assistance   Sitting balance-Leahy Scale: Good       Standing balance-Leahy Scale: Good                               Pertinent Vitals/Pain Pain Assessment Pain Assessment: No/denies pain    Home Living Family/patient expects to be discharged to:: Private residence Living Arrangements: Children;Spouse/significant other Available Help at Discharge: Family Type of Home: Apartment Home Access: Stairs to enter Entrance Stairs-Rails: Doctor, General Practice of Steps: flight   Home Layout: One level Home Equipment: Shower seat Additional Comments: shower chair does not fit in the shower    Prior Function Prior Level of Function : Working/employed;Driving             Mobility Comments: walks without assistive device, needs to sit after negotiating the stairs ADLs Comments: works from home with office in bedroom; some assist at times for shower; using instacart since struggling and without a car; does  vacuum and folds clothes     Extremity/Trunk Assessment   Upper Extremity Assessment Upper Extremity Assessment: Generalized weakness    Lower Extremity Assessment Lower Extremity Assessment: Generalized weakness    Cervical / Trunk Assessment Cervical / Trunk Assessment: Other exceptions Cervical / Trunk Exceptions: increased body habitus  Communication   Communication Communication: No apparent difficulties    Cognition Arousal: Alert Behavior During Therapy: WFL for tasks assessed/performed   PT -  Cognitive impairments: No apparent impairments                         Following commands: Intact       Cueing       General Comments General comments (skin integrity, edema, etc.): SpO2 on 6L O2 at rest 91%; with ambulation up to 8L O2 down to 83% with HR max 125, back to 94% and able to lower to 6L at rest.    Exercises     Assessment/Plan    PT Assessment Patient needs continued PT services  PT Problem List Decreased activity tolerance;Decreased mobility;Cardiopulmonary status limiting activity;Decreased safety awareness       PT Treatment Interventions Gait training;Stair training;Functional mobility training;Therapeutic activities;Therapeutic exercise;Balance training;Patient/family education    PT Goals (Current goals can be found in the Care Plan section)  Acute Rehab PT Goals Patient Stated Goal: return home asap PT Goal Formulation: With patient Time For Goal Achievement: 02/24/24 Potential to Achieve Goals: Good    Frequency Min 2X/week     Co-evaluation               AM-PAC PT 6 Clicks Mobility  Outcome Measure Help needed turning from your back to your side while in a flat bed without using bedrails?: None Help needed moving from lying on your back to sitting on the side of a flat bed without using bedrails?: None Help needed moving to and from a bed to a chair (including a wheelchair)?: None Help needed standing up from a chair using your arms (e.g., wheelchair or bedside chair)?: None Help needed to walk in hospital room?: A Little Help needed climbing 3-5 steps with a railing? : A Little 6 Click Score: 22    End of Session Equipment Utilized During Treatment: Oxygen Activity Tolerance: Patient tolerated treatment well Patient left: in bed;with call bell/phone within reach   PT Visit Diagnosis: Other abnormalities of gait and mobility (R26.89);Difficulty in walking, not elsewhere classified (R26.2)    Time: 8464-8391 PT Time  Calculation (min) (ACUTE ONLY): 33 min   Charges:   PT Evaluation $PT Eval Moderate Complexity: 1 Mod PT Treatments $Gait Training: 8-22 mins PT General Charges $$ ACUTE PT VISIT: 1 Visit         Micheline Portal, PT Acute Rehabilitation Services Office:[phone] 02/10/2024   Montie Portal 02/10/2024, 4:48 PM

## 2024-02-10 NOTE — Progress Notes (Signed)
 Echocardiogram 2D Echocardiogram has been performed.  Thea Norlander 02/10/2024, 11:56 AM

## 2024-02-10 NOTE — Progress Notes (Signed)
 NAMEChrysta Paul, MRN:  [id number], DOB:  18-Mar-1986, LOS: 1 ADMISSION DATE:  02/09/2024, CONSULTATION DATE:  11/17 REFERRING MD:  Dr. Yolande, CHIEF COMPLAINT:  SOB   History of Present Illness:  38 year old female with past medical history as below, which is significant for COPD, CHF, morbid obesity, diabetes mellitus, and chronic respiratory failure on 4 L home oxygen.  She presented to [facility] emergency department complaining of upper respiratory infection type symptoms lasting over a period of days.  Symptoms included cough productive of green sputum, shortness of breath, and lower extremity edema.  Upon arrival to the emergency department her oxygen saturation was 76% on 5 L and she was also found to have hypercarbic respiratory failure on venous blood gas.  BiPAP therapy was initiated, however, the patient had difficulty tolerating BiPAP leading to Precedex and benzodiazepine administration.  There was significant concern the patient may have progressed to needing intubation, therefore pulmonary critical care medicine was asked to evaluate for admission.  Pertinent  Medical History   has a past medical history of Asthma, CHF (congestive heart failure) (HCC), Morbid obesity (HCC), and Type 2 diabetes mellitus (HCC) (01/02/2022).   Significant Hospital Events: Including procedures, antibiotic start and stop dates in addition to other pertinent events   11/17 admitted  Interim History / Subjective:  Overnight her breathing feels improved.  She wants to come today.  Tried walking multiple times but not able to oxygenate appropriately on 5 L.  Her oxygen concentrator goes up to 5; she is prescribed 4 L at baseline.  Objective    Blood pressure 111/72, pulse 71, temperature (!) 97.5 F (36.4 C), temperature source Axillary, resp. rate 18, height 5' 4 (1.626 m), weight (!) 235.9 kg, SpO2 96%.    Vent Mode: BIPAP;PCV FiO2 (%):  [40 %-60 %] 60 % Set Rate:  [10 bmp] 10  bmp PEEP:  [5 cmH20-8 cmH20] 5 cmH20   Intake/Output Summary (Last 24 hours) at 02/10/2024 0831 Last data filed at 02/10/2024 0818 Gross per 24 hour  Intake 594.63 ml  Output 2110 ml  Net -1515.37 ml   Filed Weights   02/09/24 1036 02/09/24 1529 02/10/24 0500  Weight: (!) 239 kg (!) 231.6 kg (!) 235.9 kg    Examination: General: Chronically ill-appearing woman sitting up in bed in no acute distress HENT: [place]/AT, eyes anicteric Lungs: Distant lung sounds throughout, wheezing is improved today Cardiovascular: S1-S2, regular rate and rhythm Abdomen: Obese, soft, nondistended Extremities: No acute limb deformity, no cyanosis Neuro: Awake and alert, answering questions appropriately  I/O -2L   Bicarb 33 BUN 10 Cr 0.65 WBC 10.5 H/H 10/34.7 Platelets 256 RVP : rhinovirus Resp culture: few PMN, multiple organisms>  Blood culture> NGTD  Resolved problem list   Assessment and Plan   Acute on chronic respiratory failure with hypoxia and hypercarbia  COPD with possible exacerbation due to rhinovirus infection Acute on chronic HFpEF - think is likely the chief driver of her symptoms at this point.  OSA, noncompliant with CPAP at home -droplet precautions  -diuresis-Lasix  60 mg x 2 doses - Continue bronchodilators -Ambulated multiple times in the unit today - Continue weaning down supplemental oxygen.  Still requiring more than 5 L to maintain saturations.  On 5 L she desaturated into the high 70s after walking. -Continue prednisone  40 g daily -No ongoing need for antibiotics at this point, follow respiratory culture.  Can de-escalate on antibiotics if febrile - Recommend smoking cessation, continue nicotine  gum -Recommend  CPAP, but unlikely this will be long-term for her with h/o noncompliance  Anemia Thrombocytopenia (48 at time of admission, was 251 one month prior)> X normal today, suspect this was surreptitiously low -Start Lovenox    DM 2 -SSI as needed - Goal blood  glucose less than 180  Morbid obesity - Long-term recommend weight loss   Labs   CBC: Recent Labs  Lab 02/09/24 0859 02/09/24 0914 02/09/24 1116 02/09/24 1213 02/09/24 1330 02/09/24 2007 02/10/24 0318  WBC 6.7  --   --   --   --  9.3 10.5  NEUTROABS 4.7  --   --   --   --   --   --   HGB 10.1*   < > 12.2 12.2 12.2 10.2* 10.0*  HCT 35.0*   < > 36.0 36.0 36.0 35.3* 34.7*  MCV 93.1  --   --   --   --  92.7 92.8  PLT 48*  --   --   --   --  203 256   < > = values in this interval not displayed.    Basic Metabolic Panel: Recent Labs  Lab 02/09/24 0945 02/09/24 1116 02/09/24 1213 02/09/24 1330 02/09/24 2007 02/10/24 0318  NA 139 138 138 137  --  137  K 4.4 4.6 4.2 4.3  --  4.8  CL 97*  --   --   --   --  93*  CO2 35*  --   --   --   --  33*  GLUCOSE 122*  --   --   --   --  114*  BUN 9  --   --   --   --  10  CREATININE 0.60  --   --   --  0.74 0.65  CALCIUM 9.2  --   --   --   --  8.7*   GFR: Estimated Creatinine Clearance: 187.7 mL/min (by C-G formula based on SCr of 0.65 mg/dL). Recent Labs  Lab 02/09/24 0859 02/09/24 2007 02/10/24 0318  WBC 6.7 9.3 10.5    Liver Function Tests: Recent Labs  Lab 02/09/24 0945  AST 14*  ALT 7  ALKPHOS 70  BILITOT 0.4  PROT 8.3*  ALBUMIN 3.8     Critical care time:       Leita SHAUNNA Gaskins, DO 02/10/24 6:52 PM [facility]  For contact information, see Amion. If no response to pager, please call PCCM consult pager. After hours, 7PM- 7AM, please call Elink.

## 2024-02-10 NOTE — Plan of Care (Signed)

## 2024-02-10 NOTE — Progress Notes (Signed)
 Heart Failure Navigator Progress Note  Assessed for Heart & Vascular TOC clinic readiness.  Patient does not meet criteria due to she is a patient with [facility]. No HF TOC. .   Navigator will sign off at this time.   Stephane Haddock, BSN, Scientist, Clinical (histocompatibility And Immunogenetics) Only

## 2024-02-11 LAB — BASIC METABOLIC PANEL WITH GFR
Anion gap: 12 (ref 5–15)
BUN: 15 mg/dL (ref 6–20)
CO2: 37 mmol/L — ABNORMAL HIGH (ref 22–32)
Calcium: 8.9 mg/dL (ref 8.9–10.3)
Chloride: 89 mmol/L — ABNORMAL LOW (ref 98–111)
Creatinine, Ser: 0.69 mg/dL (ref 0.44–1.00)
GFR, Estimated: >60 mL/min (ref >60)
Glucose, Bld: 92 mg/dL (ref 70–99)
Potassium: 3.9 mmol/L (ref 3.5–5.1)
Sodium: 138 mmol/L (ref 135–145)

## 2024-02-11 LAB — GLUCOSE, CAPILLARY
Glucose-Capillary: 117 mg/dL — ABNORMAL HIGH (ref 70–99)
Glucose-Capillary: 93 mg/dL (ref 70–99)
Glucose-Capillary: 130 mg/dL — ABNORMAL HIGH (ref 70–99)
Glucose-Capillary: 199 mg/dL — ABNORMAL HIGH (ref 70–99)
Glucose-Capillary: 165 mg/dL — ABNORMAL HIGH (ref 70–99)

## 2024-02-11 LAB — MISC LABCORP TEST (SEND OUT)
Labcorp test code: 83935
Labcorp test code: 83935

## 2024-02-11 LAB — MAGNESIUM: Magnesium: 1.9 mg/dL (ref 1.7–2.4)

## 2024-02-11 MED ORDER — MELATONIN 3 MG PO TABS
3.0000 mg | ORAL_TABLET | Freq: Every evening | ORAL | Status: DC | PRN
  Administered 2024-02-12: 3 mg via ORAL
  Filled 2024-02-11: qty 1

## 2024-02-11 MED ORDER — FUROSEMIDE 10 MG/ML IJ SOLN
80.0000 mg | Freq: Once | INTRAMUSCULAR | Status: AC
  Administered 2024-02-11: 80 mg via INTRAVENOUS
  Filled 2024-02-11: qty 8

## 2024-02-11 MED ORDER — BENZONATATE 100 MG PO CAPS
100.0000 mg | ORAL_CAPSULE | Freq: Three times a day (TID) | ORAL | Status: DC | PRN
  Administered 2024-02-11 (×3): 100 mg via ORAL
  Filled 2024-02-11 (×3): qty 1

## 2024-02-11 MED ORDER — FUROSEMIDE 10 MG/ML IJ SOLN
80.0000 mg | Freq: Once | INTRAMUSCULAR | Status: AC
  Administered 2024-02-12: 80 mg via INTRAVENOUS
  Filled 2024-02-11: qty 8

## 2024-02-11 NOTE — Progress Notes (Signed)
 Pt unable to tolerate bipap at this time due to cough. RT will continue to be available as needed.

## 2024-02-11 NOTE — Progress Notes (Signed)
 Physical Therapy Treatment Patient Details Name: Renee Paul MRN: [id number] DOB: Jun 22, 1985 Today's Date: 02/11/2024   History of Present Illness 38 y/o F adm 02/09/24 with hypoxia, LB edema, UTI, COPD exacerbation. PMHx: obesity, tobacco abuse, OSA, COPD on 4L O2, CHF, DM    PT Comments  Pt in bed on arrival and stating she feels like a little girl in a big body and wants to feel better. Pt on 4L on arrival with SPO2 88%, probe changed with SPO2 92% and pt with saturation 84-92% on 4L with gait with inconsistent reading. Return to EOB end of gait with SPO2 85% and pt stating anxiety with SOB, grossly 2 min to recover to 92% with cues for breathing technique. Pt provided with energy conservation handout and education. Pt encouraged to continue to walk with staff to monitor SPO2 and to increase use of home pulse ox to monitor function at D/C. Pt returned to bed as bari chair not currently available for lunch. Will continue to follow.   If plan is discharge home, recommend the following: A little help with walking and/or transfers;Help with stairs or ramp for entrance   Can travel by private vehicle        Equipment Recommendations  None recommended by PT    Recommendations for Other Services       Precautions / Restrictions Precautions Precautions: Fall Recall of Precautions/Restrictions: Impaired Precaution/Restrictions Comments: watch O2     Mobility  Bed Mobility Overal bed mobility: Modified Independent             General bed mobility comments: HOB 40 degrees, momentum used to sit in bed and swing legs back to surface    Transfers Overall transfer level: Needs assistance   Transfers: Sit to/from Stand Sit to Stand: Supervision           General transfer comment: assist for lines/safety    Ambulation/Gait Ambulation/Gait assistance: Contact guard assist Gait Distance (Feet): 90 Feet Assistive device: None Gait Pattern/deviations: Step-through pattern,  Decreased stride length, Wide base of support   Gait velocity interpretation: <1.8 ft/sec, indicate of risk for recurrent falls   General Gait Details: increased lateral weight shift with wide BOS due to limb girth, assist for O2 with inconsistent pleth reading and SPO2 84-92% on 4L with gait, pt fatigued end of trial   Stairs             Wheelchair Mobility     Tilt Bed    Modified Rankin (Stroke Patients Only)       Balance Overall balance assessment: Needs assistance   Sitting balance-Leahy Scale: Good       Standing balance-Leahy Scale: Good                              Communication Communication Communication: No apparent difficulties  Cognition Arousal: Alert Behavior During Therapy: WFL for tasks assessed/performed   PT - Cognitive impairments: No apparent impairments                         Following commands: Intact      Cueing    Exercises      General Comments        Pertinent Vitals/Pain Pain Assessment Pain Assessment: No/denies pain    Home Living  Prior Function            PT Goals (current goals can now be found in the care plan section) Progress towards PT goals: Progressing toward goals    Frequency    Min 2X/week      PT Plan      Co-evaluation              AM-PAC PT 6 Clicks Mobility   Outcome Measure  Help needed turning from your back to your side while in a flat bed without using bedrails?: A Little Help needed moving from lying on your back to sitting on the side of a flat bed without using bedrails?: A Little Help needed moving to and from a bed to a chair (including a wheelchair)?: A Little Help needed standing up from a chair using your arms (e.g., wheelchair or bedside chair)?: None Help needed to walk in hospital room?: A Little Help needed climbing 3-5 steps with a railing? : A Little 6 Click Score: 19    End of Session Equipment  Utilized During Treatment: Oxygen Activity Tolerance: Patient tolerated treatment well Patient left: in bed;with call bell/phone within reach Nurse Communication: Mobility status PT Visit Diagnosis: Other abnormalities of gait and mobility (R26.89);Difficulty in walking, not elsewhere classified (R26.2)     Time: 8773-8751 PT Time Calculation (min) (ACUTE ONLY): 22 min  Charges:    $Gait Training: 8-22 mins PT General Charges $$ ACUTE PT VISIT: 1 Visit                     Lenoard SQUIBB, PT Acute Rehabilitation Services Office: [phone]    Lenoard NOVAK Ellesse Antenucci 02/11/2024, 1:57 PM

## 2024-02-11 NOTE — Progress Notes (Signed)
 PT refused BIPAP for the night. Pt stated the machine drys out her throat too much. RT made PT aware if she feels that she needs it, to let us  know. BIPAP at bedside. RN made aware.

## 2024-02-11 NOTE — Progress Notes (Signed)
 Pt with continued desaturations to 75% while asleep. HFNC titrated to 15L due to pt refusing to wear the bipap at this time. RT will continue to monitor.

## 2024-02-12 ENCOUNTER — Other Ambulatory Visit (HOSPITAL_COMMUNITY): Payer: Self-pay

## 2024-02-12 DIAGNOSIS — E662 Morbid (severe) obesity with alveolar hypoventilation: Secondary | ICD-10-CM

## 2024-02-12 LAB — BASIC METABOLIC PANEL WITH GFR
Anion gap: 10 (ref 5–15)
BUN: 16 mg/dL (ref 6–20)
CO2: 36 mmol/L — ABNORMAL HIGH (ref 22–32)
Calcium: 8.3 mg/dL — ABNORMAL LOW (ref 8.9–10.3)
Chloride: 90 mmol/L — ABNORMAL LOW (ref 98–111)
Creatinine, Ser: 0.69 mg/dL (ref 0.44–1.00)
GFR, Estimated: >60 mL/min (ref >60)
Glucose, Bld: 81 mg/dL (ref 70–99)
Potassium: 3.6 mmol/L (ref 3.5–5.1)
Sodium: 136 mmol/L (ref 135–145)

## 2024-02-12 LAB — CULTURE, RESPIRATORY W GRAM STAIN: Culture: NORMAL

## 2024-02-12 LAB — GLUCOSE, CAPILLARY
Glucose-Capillary: 107 mg/dL — ABNORMAL HIGH (ref 70–99)
Glucose-Capillary: 122 mg/dL — ABNORMAL HIGH (ref 70–99)
Glucose-Capillary: 147 mg/dL — ABNORMAL HIGH (ref 70–99)

## 2024-02-12 MED ORDER — POTASSIUM CHLORIDE CRYS ER 20 MEQ PO TBCR
60.0000 meq | EXTENDED_RELEASE_TABLET | Freq: Once | ORAL | Status: AC
  Administered 2024-02-12: 60 meq via ORAL
  Filled 2024-02-12: qty 3

## 2024-02-12 MED ORDER — SPIRONOLACTONE 25 MG PO TABS
25.0000 mg | ORAL_TABLET | Freq: Every day | ORAL | 0 refills | Status: AC
Start: 2024-02-12 — End: ?
  Filled 2024-02-12: qty 30, 30d supply, fill #0

## 2024-02-12 MED ORDER — FUROSEMIDE 10 MG/ML IJ SOLN
80.0000 mg | Freq: Once | INTRAMUSCULAR | Status: AC
  Administered 2024-02-12: 80 mg via INTRAVENOUS
  Filled 2024-02-12: qty 8

## 2024-02-12 MED ORDER — POTASSIUM CHLORIDE CRYS ER 20 MEQ PO TBCR
40.0000 meq | EXTENDED_RELEASE_TABLET | Freq: Once | ORAL | Status: AC
  Administered 2024-02-12: 40 meq via ORAL
  Filled 2024-02-12: qty 2

## 2024-02-12 MED ORDER — FUROSEMIDE 40 MG PO TABS
40.0000 mg | ORAL_TABLET | Freq: Every day | ORAL | 0 refills | Status: AC
  Filled 2024-02-12: qty 30, 30d supply, fill #0

## 2024-02-12 NOTE — TOC Transition Note (Addendum)
 Transition of Care [facility]) - Discharge Note   Patient Details  Name: [name] MRN: [id number] Date of Birth: 1985-12-14  Transition of Care [facility] CM/SW Contact:  Roxie KANDICE Stain, RN Phone Number: 02/12/2024, 2:37 PM   Clinical Narrative:    Renee Paul is stable to discharge home. Follow up apt on AVS. Patient has home 02 @ baseline.  No ICM (Inpatient Care Management) needs at this time.     Final next level of care: Home/Self Care Barriers to Discharge: Barriers Resolved   Patient Goals and CMS Choice Patient states their goals for this hospitalization and ongoing recovery are:: return home          Discharge Placement                 Home      Discharge Plan and Services Additional resources added to the After Visit Summary for                                       Social Drivers of Health (SDOH) Interventions SDOH Screenings   Food Insecurity: Patient Declined (02/09/2024)  Housing: Low Risk  (02/10/2024)  Transportation Needs: Patient Declined (02/09/2024)  Utilities: Patient Declined (02/09/2024)  Financial Resource Strain: Medium Risk (07/28/2023)   Received from [facility]  Physical Activity: Insufficiently Active (07/28/2023)   Received from [facility]  Social Connections: Socially Integrated (07/28/2023)   Received from [facility]  Stress: Stress Concern Present (07/28/2023)   Received from [facility]  Tobacco Use: High Risk (02/09/2024)     Readmission Risk Interventions    02/12/2024    2:37 PM 01/03/2022    1:48 PM  Readmission Risk Prevention Plan  Post Dischage Appt Complete   Medication Screening Complete   Transportation Screening Complete Complete  PCP or Specialist Appt within 3-5 Days  Complete  HRI or Home Care Consult  Complete  Social Work Consult for Recovery Care Planning/Counseling  Complete  Palliative Care Screening  Complete  Medication Review Oceanographer)  Complete

## 2024-02-12 NOTE — Discharge Instructions (Signed)
 Use 4 LPM of home oxygen at rest, and 6 LPM when walking around or otherwise exerting yourself.  Start taking your prescribed Lasix  (furosemide ) and spironolactone  -- water pills -- as listed on your discharge medication list.  Keep your follow up appointment with your PCP which you have already scheduled for 02/23/2024.  If your breathing worsens, or you  have increased swelling in your legs, or your oxygen level is persistently < 88% despite the use of oxygen and the instructions above, you should seek emergent medical attention / call 911.

## 2024-02-12 NOTE — Discharge Summary (Signed)
 CRITICAL CARE DISCHARGE SUMMARY Patient ID: Renee Paul MRN: [id number] DOB/AGE: Dec 18, 1985 38 y.o.  Admit date: 02/09/2024 Discharge date: 02/12/2024  Problem List Principal Problem:   COPD (chronic obstructive pulmonary disease) (HCC)  HPI:   [name] is a 38 y/o woman with a history of obesity, tobacco abuse, OSA who presented with 1 week of UTI, green sputum which was treated with doxycycline  as an outpatient.  During this time which short of breath she did not take Lasix  on a daily basis.  She cannot tell when she has additional swelling from her baseline.  She presented to the emergency department today with respiratory distress.  She was satting 76% on 5 L nasal cannula; her baseline oxygen requirement is 3 to 4 L of oxygen at home.  With EMS she received 125 mg of Solu-Medrol  and 2 DuoNebs.  In the emergency department she started on 6 L of oxygen but eventually required BiPAP with increasing CO2.  She was transferred to [facility] for ongoing management of respiratory failure.  Her breathing is improved since receiving Lasix  today.  Ongoing tobacco abuse, less than half pack per week.  Wants to quit smoking.   Past medical, surgical, social, family history reviewed. ROS notable for shortness of breath, cough, sputum, wheezing   BP 113/73   Pulse 79   Temp 98.2 F (36.8 C) (Oral)   Resp 17   Ht 5' 4 (1.626 m)   Wt (!) 231.6 kg   SpO2 95%   BMI 87.64 kg/m  Chronically ill-appearing woman sitting up in bed no acute distress [place]/AT, eyes anicteric No respiratory distress, no conversational dyspnea.  Scattered wheezing bilaterally. S1-S2, regular rate and rhythm Abdomen obese, soft, nontender No pitting edema, no cyanosis Awake and alert, answering questions appropriately   ABG         Component Value Date/Time    HCO3 40.2 (H) 02/09/2024 1330    TCO2 43 (H) 02/09/2024 1330    O2SAT 83 02/09/2024 1330      Bicarb 35 BUN 9 Creatinine 0.6 BNP 583 WBC  6.7 H/H 10.1/35 Platelets 48 Flu, RSV, COVID-negative CXR personally reviewed-low penetration, increased interstitial markings throughout entire lung fields bilaterally   Assessment and plan: Acute on chronic hypoxic and hypercapnic respiratory failure-suspect acute pulmonary edema contributing.  COPD versus asthma exacerbation could also be contributing with wheezing.  Viral or bacterial infection not ruled out. OHS OSA - Continue steroids - Continue diuresis - Bronchodilators-Brovana and Yupelri + DuoNebs as needed -Encouraged tobacco cessation and discussed methods to do this - Long-term recommend weight loss -Encourage compliance with CPAP when sleeping   Acute on chronic HFpEF; last EF 60-65% in 2023 -Diuresis -Repeat echocardiogram ordered - Long-term recommend weight loss   Acute thrombocytopenia -Trend - If no obvious etiology may need outpatient evaluation by hematology -Hold chemical DVT prophylaxis; out of bed mobility and SCDs   Tobacco abuse - Recommended family get rid of ashtrays and lighters at home - Nicotine  gum as needed since she is using tobacco so infrequently   Morbid obesity; Body mass index is 87.64 kg/m. - Long-term recommend weight loss   Hyperglycemia - Check A1c - Sliding scale insulin  as needed - Goal blood glucose less than 180   Full code-confirmed with patient at bedside.  Daughter and husband both present at bedside.   This patient is critically ill with multiple organ system failure which requires frequent high complexity decision making, assessment, support, evaluation, and titration of therapies. This was  completed through the application of advanced monitoring technologies and extensive interpretation of multiple databases. During this encounter critical care time was devoted to patient care services described in this note for 43 minutes.   Leita SHAUNNA Gaskins, DO 02/09/24 4:37 PM [facility]   For contact  information, see Amion. If no response to pager, please call PCCM consult pager. After hours, 7PM- 7AM, please call Elink.    Hospital Course:  # Acute on chronic hypoxemic and hypercapnic respiratory failure # Obesity Hypoventilation Syndrome (OHS) - Felt to be multifactorial, attributable to a combination of decompensated HFpEF and volume overload, obesity hypoventilation syndrome, and some component of COPD exacerbation. - Rapidity of improvement in response to BiPAP and diuresis argues more for decompensated HFpEF superimposed on / complicating her baseline obesity hypoventilation syndrome. - Medical recommendation was nevertheless to complete treatment for COPD exacerbation as well but patient strongly preferred to discontinue prednisone  / declined to take any further doses beyond 11/19. - She received robust diuresis while inpatient with associated improvement in her exertional dyspnea and exertional worsening of hypoxemia such that she could ambulate on 6 LPM around ICU, albeit with brief desaturation toward the end of that ambulation. Patient endorsed her ability to ambulate actually being better than her baseline and attributed this to the Lasix  she had received. - Patient was adamant that she be discharged on 11/20 in setting of significant financial stressors. She was advised that the team's medical recommendation was that a safer discharge could be facilitated after further diuresis if she were willing to remain inpatient for an additional 24-48 hrs, however patient stated she needed to leave due to before-mentioned reasons. - The safest possible discharge plan was therefore instituted at the patient's request. This included close follow up with her PCP on 02/23/2024, initiation of lasix  40 mg PO daily + spironolactone  25 mg daily (which she had previously been prescribed but endorsed she was not taking prior to admission). Prescriptions for these were provided at discharge. - The patient  provided reassurance that she had a pulse oximeter at home and would return to the hospital / call EMS if she had worsening shortness of breath or her SpO2 was persistently < 88%. - She was advised to ambulate with 6 LPM of oxygen, but continue to use 4 LPM at rest. She was further advised on activity limitations to help prevent desaturation during ambulation despite the use of supplemental oxygen.  Transitional Issues: - Recommend repeating basic metabolic panel to check renal function and electrolytes at her follow up appointment with her PCP. - If patient would be willing to consider PAP therapy / NIPPV for her diagnosis of OHS, please consider referral to a Sleep specialist. - Patient would benefit from counseling about weight loss options, including but not limited to consideration of Zepbound. She informs me that she is, indeed, interested in losing substantial amount of weight to help with her breathing and overall health.   Labs at discharge Lab Results  Component Value Date   CREATININE 0.69 02/12/2024   BUN 16 02/12/2024   NA 136 02/12/2024   K 3.6 02/12/2024   CL 90 (L) 02/12/2024   CO2 36 (H) 02/12/2024   Lab Results  Component Value Date   WBC 10.5 02/10/2024   HGB 10.0 (L) 02/10/2024   HCT 34.7 (L) 02/10/2024   MCV 92.8 02/10/2024   PLT 256 02/10/2024   Lab Results  Component Value Date   ALT 7 02/09/2024   AST  14 (L) 02/09/2024   ALKPHOS 70 02/09/2024   BILITOT 0.4 02/09/2024   No results found for: INR, PROTIME  Current radiology studies No results found.  Disposition:  Discharge disposition: 01-Home or Self Care       Discharge Instructions     Diet - low sodium heart healthy   Complete by: As directed    Discharge instructions   Complete by: As directed    Use 4 LPM of home oxygen at rest, and 6 LPM when walking around or otherwise exerting yourself.  Start taking your prescribed Lasix  (furosemide ) and spironolactone  -- water pills -- as  listed on your discharge medication list.  Keep your follow up appointment with your PCP which you have already scheduled for 02/23/2024.  If your breathing worsens, or you  have increased swelling in your legs, or your oxygen level is persistently < 88% despite the use of oxygen and the instructions above, you should seek emergent medical attention / call 911.   Increase activity slowly   Complete by: As directed       Allergies as of 02/12/2024       Reactions   Tape Other (See Comments)   Skin irritation        Medication List     STOP taking these medications    doxycycline  100 MG EC tablet Commonly known as: DORYX        TAKE these medications    acetaminophen  500 MG tablet Commonly known as: TYLENOL  Take 500 mg by mouth every 6 (six) hours as needed for mild pain (pain score 1-3) or moderate pain (pain score 4-6).   albuterol  108 (90 Base) MCG/ACT inhaler Commonly known as: VENTOLIN  HFA Inhale 2 puffs into the lungs every 4 (four) hours as needed for wheezing or shortness of breath.   budesonide  0.25 MG/2ML nebulizer solution Commonly known as: PULMICORT  Take 2 mLs (0.25 mg total) by nebulization 2 (two) times daily.   Farxiga 10 MG Tabs tablet Generic drug: dapagliflozin propanediol Take 10 mg by mouth daily.   ferrous sulfate  325 (65 FE) MG tablet Take 1 tablet (325 mg total) by mouth daily with breakfast.   furosemide  40 MG tablet Commonly known as: LASIX  Take 1 tablet (40 mg total) by mouth daily. Take extra dose in afternoon if worsening swelling in legs/arms or 3-5 lb weight gain in 24 hours.   spironolactone  25 MG tablet Commonly known as: ALDACTONE  Take 1 tablet (25 mg total) by mouth daily.        Follow-up Information     Medicine, [facility]. Go on 02/23/2024.   Specialty: Family Medicine                 Discharged Condition: fair  Time spent on discharge: 50 minutes  Vital signs at  Discharge. Temp:  [98 F (36.7 C)-98.4 F (36.9 C)] 98 F (36.7 C) (11/20 1235) Pulse Rate:  [79-96] 80 (11/20 1200) Resp:  [14-26] 20 (11/20 1200) BP: (104-130)/(53-80) 113/76 (11/20 1200) SpO2:  [88 %-94 %] 89 % (11/20 1200) on 4 LPM nasal cannula A&Ox3.  Able to speak in full sentences without pausing to catch breath. Morbidly obese. Non pitting edema in bilateral lower extremities.   Office follow up Special Information or instructions.  Signed: Lamar JINNY Dales, MD  Pulmonary Critical Care Attending 02/12/2024, 1:18 PM  See Amion for CCM page If no response to pager, please call [phone] until 1900 After 1900 please call ELINK [phone]

## 2024-02-12 NOTE — Plan of Care (Signed)
  Problem: Health Behavior/Discharge Planning: Goal: Ability to manage health-related needs will improve Outcome: Progressing   Problem: Clinical Measurements: Goal: Ability to maintain clinical measurements within normal limits will improve Outcome: Progressing   Problem: Clinical Measurements: Goal: Will remain free from infection Outcome: Progressing   Problem: Clinical Measurements: Goal: Diagnostic test results will improve Outcome: Progressing   Problem: Clinical Measurements: Goal: Respiratory complications will improve Outcome: Progressing   Problem: Coping: Goal: Ability to adjust to condition or change in health will improve Outcome: Progressing   Plan of care, monitoring, assessment, treatment, and intervention (s) ongoing, see MAR see flowsheet

## 2024-02-13 LAB — LEGIONELLA PNEUMOPHILA SEROGP 1 UR AG: L. pneumophila Serogp 1 Ur Ag: NEGATIVE

## 2024-02-14 LAB — CULTURE, BLOOD (ROUTINE X 2)
Culture: NO GROWTH
Culture: NO GROWTH
Special Requests: ADEQUATE
